# Patient Record
Sex: Male | Born: 1940 | Race: White | Hispanic: No | Smoking: Former smoker
Health system: Southern US, Community
[De-identification: ages and names within clinical notes are randomized; demographics above are authoritative.]

## PROBLEM LIST (undated history)

## (undated) DIAGNOSIS — I2510 Atherosclerotic heart disease of native coronary artery without angina pectoris: Secondary | ICD-10-CM

## (undated) DIAGNOSIS — I209 Angina pectoris, unspecified: Secondary | ICD-10-CM

## (undated) DIAGNOSIS — N189 Chronic kidney disease, unspecified: Secondary | ICD-10-CM

## (undated) DIAGNOSIS — R0600 Dyspnea, unspecified: Secondary | ICD-10-CM

## (undated) DIAGNOSIS — I10 Essential (primary) hypertension: Secondary | ICD-10-CM

## (undated) DIAGNOSIS — E7800 Pure hypercholesterolemia, unspecified: Secondary | ICD-10-CM

## (undated) DIAGNOSIS — M549 Dorsalgia, unspecified: Secondary | ICD-10-CM

## (undated) DIAGNOSIS — I219 Acute myocardial infarction, unspecified: Secondary | ICD-10-CM

## (undated) DIAGNOSIS — J449 Chronic obstructive pulmonary disease, unspecified: Secondary | ICD-10-CM

## (undated) DIAGNOSIS — M1990 Unspecified osteoarthritis, unspecified site: Secondary | ICD-10-CM

## (undated) DIAGNOSIS — F32A Depression, unspecified: Secondary | ICD-10-CM

## (undated) DIAGNOSIS — I509 Heart failure, unspecified: Secondary | ICD-10-CM

## (undated) HISTORY — PX: CARDIAC SURGERY: SHX584

## (undated) HISTORY — PX: COLONOSCOPY: SHX174

## (undated) HISTORY — PX: AORTIC VALVE REPLACEMENT: SHX41

## (undated) HISTORY — PX: BACK SURGERY: SHX140

---

## 2011-12-07 ENCOUNTER — Ambulatory Visit: Payer: Self-pay | Admitting: Internal Medicine

## 2011-12-24 ENCOUNTER — Ambulatory Visit: Payer: Self-pay | Admitting: Internal Medicine

## 2012-01-24 ENCOUNTER — Ambulatory Visit: Payer: Self-pay

## 2013-10-23 ENCOUNTER — Encounter: Payer: Self-pay | Admitting: Otolaryngology

## 2013-11-22 ENCOUNTER — Encounter: Payer: Self-pay | Admitting: Otolaryngology

## 2015-12-15 ENCOUNTER — Ambulatory Visit: Payer: Medicare Other

## 2015-12-15 ENCOUNTER — Ambulatory Visit
Admission: EM | Admit: 2015-12-15 | Discharge: 2015-12-15 | Disposition: A | Discharge status: Home / Self Care | Payer: Medicare Other | Attending: Family Medicine | Admitting: Family Medicine

## 2015-12-15 DIAGNOSIS — J449 Chronic obstructive pulmonary disease, unspecified: Secondary | ICD-10-CM | POA: Insufficient documentation

## 2015-12-15 DIAGNOSIS — J441 Chronic obstructive pulmonary disease with (acute) exacerbation: Secondary | ICD-10-CM

## 2015-12-15 HISTORY — DX: Chronic obstructive pulmonary disease, unspecified: J44.9

## 2015-12-15 HISTORY — DX: Essential (primary) hypertension: I10

## 2015-12-15 HISTORY — DX: Pure hypercholesterolemia, unspecified: E78.00

## 2015-12-15 HISTORY — DX: Dorsalgia, unspecified: M54.9

## 2015-12-15 MED ORDER — SULFAMETHOXAZOLE-TRIMETHOPRIM 800-160 MG PO TABS: 1.0000 | ORAL_TABLET | Freq: Two times a day (BID) | ORAL | Status: DC

## 2015-12-15 MED ORDER — PREDNISONE 20 MG PO TABS: ORAL_TABLET | ORAL | Status: DC

## 2015-12-15 MED ORDER — IPRATROPIUM-ALBUTEROL 0.5-2.5 (3) MG/3ML IN SOLN
3.0000 mL | Freq: Once | RESPIRATORY_TRACT | Status: AC
  Administered 2015-12-15: 3 mL via RESPIRATORY_TRACT

## 2015-12-15 NOTE — ED Notes (Signed)
Hx of COPD. For the last 2 weeks getting worse. SOB at rest and worse with exertion

## 2015-12-15 NOTE — ED Provider Notes (Signed)
CSN: [id number]     Arrival date & time 12/15/15  1518 History   First MD Initiated Contact with Patient 12/15/15 [phone]     Chief Complaint  Patient presents with  . COPD   (Consider location/radiation/quality/duration/timing/severity/associated sxs/prior Treatment) HPI Comments: 75 yo male with a h/o COPD presents with a complaint of worsening cough and shortness of breath for the past 2 weeks. Denies any fevers, chills, chest pains. Has been using his inhalers (albuterol, spiriva, advair). States does not use oxygen at home and also states he's never required hospitalization for copd exacerbation.   The history is provided by the patient.    Past Medical History  Diagnosis Date  . COPD (chronic obstructive pulmonary disease) ([place])   . Hypertension   . Hypercholesteremia   . Back ache    History reviewed. No pertinent past surgical history. Family History  Problem Relation Age of Onset  . Lung disease Mother   . Cancer Father    Social History  Substance Use Topics  . Smoking status: Former Research scientist (life sciences)  . Smokeless tobacco: None  . Alcohol Use: No    Review of Systems  Allergies  Review of patient's allergies indicates no known allergies.  Home Medications   Prior to Admission medications   Medication Sig Start Date End Date Taking? Authorizing Provider  atorvastatin (LIPITOR) 80 MG tablet Take 80 mg by mouth daily.   Yes Historical Provider, MD  Fluticasone-Salmeterol (ADVAIR) 250-50 MCG/DOSE AEPB Inhale 1 puff into the lungs 2 (two) times daily.   Yes Historical Provider, MD  gabapentin (NEURONTIN) 300 MG capsule Take 300 mg by mouth 3 (three) times daily.   Yes Historical Provider, MD  ipratropium-albuterol (DUONEB) 0.5-2.5 (3) MG/3ML SOLN Take 3 mLs by nebulization.   Yes Historical Provider, MD  lisinopril (PRINIVIL,ZESTRIL) 10 MG tablet Take 10 mg by mouth daily.   Yes Historical Provider, MD  sertraline (ZOLOFT) 100 MG tablet Take 100 mg by mouth daily.   Yes  Historical Provider, MD  tiotropium (SPIRIVA) 18 MCG inhalation capsule Place 18 mcg into inhaler and inhale daily.   Yes Historical Provider, MD  traZODone (DESYREL) 50 MG tablet Take 50 mg by mouth at bedtime.   Yes Historical Provider, MD  predniSONE (DELTASONE) 20 MG tablet 3 tabs po qd for 2 days, then 2 tabs po qd for 3 days, then 1 tab po qd for 3 days, then half a tab po qd for 2 days 12/15/15   Norval Gable, MD  sulfamethoxazole-trimethoprim (BACTRIM DS,SEPTRA DS) 800-160 MG tablet Take 1 tablet by mouth 2 (two) times daily. 12/15/15   Norval Gable, MD   Meds Ordered and Administered this Visit   Medications  ipratropium-albuterol (DUONEB) 0.5-2.5 (3) MG/3ML nebulizer solution 3 mL (3 mLs Nebulization Given 12/15/15 1620)    BP 114/68 mmHg  Pulse 80  Temp(Src) 97.2 F (36.2 C) (Tympanic)  Resp 24  Ht 5\' 7"  (1.702 m)  Wt 171 lb (77.565 kg)  BMI 26.78 kg/m2  SpO2 96% No data found.   Physical Exam  Constitutional: He appears well-developed and well-nourished. No distress.  HENT:  Head: Normocephalic and atraumatic.  Right Ear: Tympanic membrane, external ear and ear canal normal.  Left Ear: Tympanic membrane, external ear and ear canal normal.  Nose: Nose normal.  Mouth/Throat: Uvula is midline, oropharynx is clear and moist and mucous membranes are normal. No oropharyngeal exudate or tonsillar abscesses.  Eyes: Conjunctivae and EOM are normal. Pupils are equal, round, and reactive to  light. Right eye exhibits no discharge. Left eye exhibits no discharge. No scleral icterus.  Neck: Normal range of motion. Neck supple. No tracheal deviation present. No thyromegaly present.  Cardiovascular: Normal rate, regular rhythm and normal heart sounds.   Pulmonary/Chest: Effort normal. No stridor. No respiratory distress. He has wheezes (mild diffuse expiratory wheezes and rhonchi bilaterally). He has no rales. He exhibits no tenderness.  Lymphadenopathy:    He has no cervical  adenopathy.  Neurological: He is alert.  Skin: Skin is warm and dry. No rash noted. He is not diaphoretic.  Nursing note and vitals reviewed.   ED Course  Procedures (including critical care time)  Labs Review Labs Reviewed - No data to display  Imaging Review Dg Chest 2 View  12/15/2015  CLINICAL DATA:  Shortness of breath on exertion with 2 week history of productive cough. EXAM: CHEST  2 VIEW COMPARISON:  01/24/2012. FINDINGS: Hyperexpansion is consistent with emphysema. Cardiopericardial silhouette is at upper limits of normal for size. Stable appearance of sternal hardware. Status post right rotator cuff surgery. The visualized bony structures of the thorax are intact. IMPRESSION: Stable.  Emphysema without acute cardiopulmonary findings. Electronically Signed   By: Misty Stanley M.D.   On: 12/15/2015 16:15     Visual Acuity Review  Right Eye Distance:   Left Eye Distance:   Bilateral Distance:    Right Eye Near:   Left Eye Near:    Bilateral Near:         MDM   1. COPD with exacerbation [facility])    Discharge Medication List as of 12/15/2015  5:02 PM    START taking these medications   Details  predniSONE (DELTASONE) 20 MG tablet 3 tabs po qd for 2 days, then 2 tabs po qd for 3 days, then 1 tab po qd for 3 days, then half a tab po qd for 2 days, Normal    sulfamethoxazole-trimethoprim (BACTRIM DS,SEPTRA DS) 800-160 MG tablet Take 1 tablet by mouth 2 (two) times daily., Starting 12/15/2015, Until Discontinued, Normal       1. x-ray results (negative chest-ray) and diagnosis reviewed with patient 2. rx as per orders above; reviewed possible side effects, interactions, risks and benefits  3. Patient given Duoneb nebulizer treatment with improvement of symptoms 4. Follow-up prn if symptoms worsen or don't improve    Norval Gable, MD 12/15/15 1710

## 2017-12-19 ENCOUNTER — Other Ambulatory Visit: Payer: Self-pay | Admitting: Family Medicine

## 2017-12-19 DIAGNOSIS — M5416 Radiculopathy, lumbar region: Secondary | ICD-10-CM

## 2017-12-19 DIAGNOSIS — I10 Essential (primary) hypertension: Secondary | ICD-10-CM

## 2017-12-27 ENCOUNTER — Ambulatory Visit
Admission: RE | Admit: 2017-12-27 | Discharge: 2017-12-27 | Disposition: A | Discharge status: Home / Self Care | Payer: Medicare Other | Source: Ambulatory Visit | Attending: Family Medicine | Admitting: Family Medicine

## 2017-12-27 DIAGNOSIS — M48061 Spinal stenosis, lumbar region without neurogenic claudication: Secondary | ICD-10-CM | POA: Diagnosis not present

## 2017-12-27 DIAGNOSIS — M5116 Intervertebral disc disorders with radiculopathy, lumbar region: Secondary | ICD-10-CM | POA: Insufficient documentation

## 2017-12-27 DIAGNOSIS — M5416 Radiculopathy, lumbar region: Secondary | ICD-10-CM

## 2017-12-27 DIAGNOSIS — N261 Atrophy of kidney (terminal): Secondary | ICD-10-CM | POA: Diagnosis not present

## 2017-12-27 DIAGNOSIS — I10 Essential (primary) hypertension: Secondary | ICD-10-CM | POA: Diagnosis present

## 2018-10-06 ENCOUNTER — Ambulatory Visit
Admission: EM | Admit: 2018-10-06 | Discharge: 2018-10-06 | Disposition: A | Discharge status: Home / Self Care | Payer: Medicare Other | Attending: Family Medicine | Admitting: Family Medicine

## 2018-10-06 DIAGNOSIS — Z87891 Personal history of nicotine dependence: Secondary | ICD-10-CM | POA: Insufficient documentation

## 2018-10-06 DIAGNOSIS — E7800 Pure hypercholesterolemia, unspecified: Secondary | ICD-10-CM | POA: Insufficient documentation

## 2018-10-06 DIAGNOSIS — I10 Essential (primary) hypertension: Secondary | ICD-10-CM | POA: Diagnosis not present

## 2018-10-06 DIAGNOSIS — Z79899 Other long term (current) drug therapy: Secondary | ICD-10-CM | POA: Insufficient documentation

## 2018-10-06 DIAGNOSIS — J029 Acute pharyngitis, unspecified: Secondary | ICD-10-CM | POA: Diagnosis not present

## 2018-10-06 DIAGNOSIS — R590 Localized enlarged lymph nodes: Secondary | ICD-10-CM | POA: Insufficient documentation

## 2018-10-06 DIAGNOSIS — J449 Chronic obstructive pulmonary disease, unspecified: Secondary | ICD-10-CM | POA: Insufficient documentation

## 2018-10-06 LAB — RAPID STREP SCREEN (MED CTR MEBANE ONLY): Streptococcus, Group A Screen (Direct): NEGATIVE

## 2018-10-06 NOTE — ED Triage Notes (Signed)
Pt here for sore throat, swollen glands in his neck and painful when swallowing for 2 days. Also complains of body aches that come and go. No fever reported. Has been taking tylenol otc. No reportedly around small children.

## 2018-10-06 NOTE — ED Provider Notes (Signed)
[facility]    CSN: [id number] Arrival date & time: 10/06/18  1600     History   Chief Complaint Chief Complaint  Patient presents with  . Sore Throat    HPI Mario Patrick is a 77 y.o. male.   HPI  -year-old male presents with a sore throat swollen glands in his neck and pain with swallowing for the last 2 days.  She has been having body aches that come and go but no fever.  Around no sick contacts that he knows of.  Taking Tylenol.  He states his wife is undergoing cancer therapy does not want to infect her.         Past Medical History:  Diagnosis Date  . Back ache   . COPD (chronic obstructive pulmonary disease) ([place])   . Hypercholesteremia   . Hypertension     There are no active problems to display for this patient.   History reviewed. No pertinent surgical history.     Home Medications    Prior to Admission medications   Medication Sig Start Date End Date Taking? Authorizing Provider  atorvastatin (LIPITOR) 80 MG tablet Take 80 mg by mouth daily.   Yes [provider]  Fluticasone-Salmeterol (ADVAIR) 250-50 MCG/DOSE AEPB Inhale 1 puff into the lungs 2 (two) times daily.   Yes [provider]  lisinopril (PRINIVIL,ZESTRIL) 10 MG tablet Take 10 mg by mouth daily.   Yes [provider]  sertraline (ZOLOFT) 100 MG tablet Take 100 mg by mouth daily.   Yes [provider]  tiotropium (SPIRIVA) 18 MCG inhalation capsule Place 18 mcg into inhaler and inhale daily.   Yes [provider]  traZODone (DESYREL) 50 MG tablet Take 50 mg by mouth at bedtime.   Yes [provider]  gabapentin (NEURONTIN) 300 MG capsule Take 300 mg by mouth 3 (three) times daily.    [provider]  ipratropium-albuterol (DUONEB) 0.5-2.5 (3) MG/3ML SOLN Take 3 mLs by nebulization.    [provider]  predniSONE (DELTASONE) 20 MG tablet 3 tabs po qd for 2 days, then 2 tabs po qd for 3 days, then 1  tab po qd for 3 days, then half a tab po qd for 2 days 12/15/15   Norval Gable, MD  sulfamethoxazole-trimethoprim (BACTRIM DS,SEPTRA DS) 800-160 MG tablet Take 1 tablet by mouth 2 (two) times daily. 12/15/15   Norval Gable, MD    Family History Family History  Problem Relation Age of Onset  . Lung disease Mother   . Cancer Father     Social History Social History   Tobacco Use  . Smoking status: Former Research scientist (life sciences)  . Smokeless tobacco: Never Used  Substance Use Topics  . Alcohol use: No  . Drug use: Not on file     Allergies   Patient has no known allergies.   Review of Systems Review of Systems  Constitutional: Positive for activity change. Negative for chills, fatigue and fever.  HENT: Positive for sore throat and trouble swallowing.   All other systems reviewed and are negative.    Physical Exam Triage Vital Signs ED Triage Vitals  Enc Vitals Group     BP 10/06/18 1607 (!) 160/92     Pulse Rate 10/06/18 1607 65     Resp 10/06/18 1607 18     Temp 10/06/18 1607 98.5 F (36.9 C)     Temp Source 10/06/18 1607 Oral     SpO2 10/06/18 1607 98 %  Weight 10/06/18 1610 155 lb (70.3 kg)     Height --      Head Circumference --      Peak Flow --      Pain Score 10/06/18 1610 0     Pain Loc --      Pain Edu? --      Excl. in [place]? --    No data found.  Updated Vital Signs BP (!) 160/92 (BP Location: Right Arm)   Pulse 65   Temp 98.5 F (36.9 C) (Oral)   Resp 18   Wt 155 lb (70.3 kg)   SpO2 98%   BMI 24.28 kg/m   Visual Acuity Right Eye Distance:   Left Eye Distance:   Bilateral Distance:    Right Eye Near:   Left Eye Near:    Bilateral Near:     Physical Exam  Constitutional: He is oriented to person, place, and time. He appears well-developed and well-nourished.  Non-toxic appearance. He does not appear ill. No distress.  HENT:  Head: Normocephalic.  Right Ear: Hearing, tympanic membrane and ear canal normal.  Left Ear: Hearing, tympanic membrane  and ear canal normal.  Mouth/Throat: Uvula is midline and mucous membranes are normal. No oral lesions. No uvula swelling. Posterior oropharyngeal erythema present. No oropharyngeal exudate, posterior oropharyngeal edema or tonsillar abscesses. Tonsils are 0 on the right. Tonsils are 0 on the left. No tonsillar exudate.  Eyes: Pupils are equal, round, and reactive to light.  Neck: Normal range of motion. Neck supple.  Sub mandibular nodes present  Pulmonary/Chest: Effort normal and breath sounds normal.  Lymphadenopathy:    He has cervical adenopathy.  Neurological: He is alert and oriented to person, place, and time.  Skin: Skin is warm and dry.  Psychiatric: He has a normal mood and affect. His behavior is normal.  Nursing note and vitals reviewed.    UC Treatments / Results  Labs (all labs ordered are listed, but only abnormal results are displayed) Labs Reviewed  RAPID STREP SCREEN ([facility] ONLY)  CULTURE, GROUP A STREP [facility])    EKG None  Radiology No results found.  Procedures Procedures (including critical care time)  Medications Ordered in UC Medications - No data to display  Initial Impression / Assessment and Plan / UC Course  I have reviewed the triage vital signs and the nursing notes.  Pertinent labs & imaging results that were available during my care of the patient were reviewed by me and considered in my medical decision making (see chart for details).   I explained to the patient the rapid strep group A testing.  His is negative today.  However additional testing will be done by incubating a culture to prove or disprove the negativity as well as identifying subgroups.  In the meantime he will need to treat this symptomatically with salt water gargles throat lozenges and analgesics as necessary.  Cultures and sensitivities will be available in 48 hours.  He worsens he may return to our clinic or go to his primary care physician   Final Clinical  Impressions(s) / UC Diagnoses   Final diagnoses:  Sore throat   Discharge Instructions   None    ED Prescriptions    None     Controlled Substance Prescriptions [place] Controlled Substance Registry consulted? Not Applicable   Bosco, [place], PA-C 10/06/18 1721

## 2018-10-09 LAB — CULTURE, GROUP A STREP (THRC)

## 2018-11-18 ENCOUNTER — Other Ambulatory Visit: Payer: Self-pay

## 2018-11-18 ENCOUNTER — Ambulatory Visit
Admission: EM | Admit: 2018-11-18 | Discharge: 2018-11-18 | Disposition: A | Discharge status: Home / Self Care | Payer: Medicare Other | Attending: Family Medicine | Admitting: Family Medicine

## 2018-11-18 ENCOUNTER — Encounter: Payer: Self-pay | Admitting: Emergency Medicine

## 2018-11-18 DIAGNOSIS — E7800 Pure hypercholesterolemia, unspecified: Secondary | ICD-10-CM | POA: Insufficient documentation

## 2018-11-18 DIAGNOSIS — Z952 Presence of prosthetic heart valve: Secondary | ICD-10-CM | POA: Diagnosis not present

## 2018-11-18 DIAGNOSIS — Z87891 Personal history of nicotine dependence: Secondary | ICD-10-CM

## 2018-11-18 DIAGNOSIS — Z951 Presence of aortocoronary bypass graft: Secondary | ICD-10-CM | POA: Diagnosis not present

## 2018-11-18 DIAGNOSIS — I10 Essential (primary) hypertension: Secondary | ICD-10-CM | POA: Insufficient documentation

## 2018-11-18 DIAGNOSIS — J111 Influenza due to unidentified influenza virus with other respiratory manifestations: Secondary | ICD-10-CM | POA: Insufficient documentation

## 2018-11-18 DIAGNOSIS — Z79899 Other long term (current) drug therapy: Secondary | ICD-10-CM | POA: Diagnosis not present

## 2018-11-18 DIAGNOSIS — J449 Chronic obstructive pulmonary disease, unspecified: Secondary | ICD-10-CM | POA: Insufficient documentation

## 2018-11-18 DIAGNOSIS — R05 Cough: Secondary | ICD-10-CM | POA: Diagnosis present

## 2018-11-18 LAB — RAPID INFLUENZA A&B ANTIGENS (ARMC ONLY)
INFLUENZA A (ARMC): POSITIVE — AB
INFLUENZA B (ARMC): NEGATIVE

## 2018-11-18 MED ORDER — OSELTAMIVIR PHOSPHATE 75 MG PO CAPS: 75.0000 mg | ORAL_CAPSULE | Freq: Two times a day (BID) | ORAL | 0 refills | Status: DC

## 2018-11-18 MED ORDER — PREDNISONE 50 MG PO TABS: ORAL_TABLET | ORAL | 0 refills | Status: DC

## 2018-11-18 NOTE — ED Triage Notes (Signed)
Pt c/o body aches, cough, chills, and nasal congestion. Started 2 days ago.

## 2018-11-18 NOTE — ED Provider Notes (Signed)
[facility]    CSN: [id number] Arrival date & time: 11/18/18  1241  History   Chief Complaint Chief Complaint  Patient presents with  . Generalized Body Aches  . Cough   HPI   77 year old male with COPD presents with concerns for influenza.  Patient reports that his symptoms started 2 days ago.  Reports subjective fever, chills, body aches, congestion, cough.  Feels very poorly.  Concerned about flu.  No reported sick contacts.  Endorses compliance with his COPD medications.  No known exacerbating factors.  No other associated symptoms.  No complaints.  PMH, Surgical Hx, Family Hx, Social History reviewed and updated as below.  Past Medical History:  Diagnosis Date  . Back ache   . COPD (chronic obstructive pulmonary disease) ([place])   . Hypercholesteremia   . Hypertension    Past Surgical History:  Procedure Laterality Date  . AORTIC VALVE REPLACEMENT    . BACK SURGERY    . CARDIAC SURGERY     CABG X4   Home Medications    Prior to Admission medications   Medication Sig Start Date End Date Taking? Authorizing Provider  atorvastatin (LIPITOR) 80 MG tablet Take 80 mg by mouth daily.   Yes [provider]  Fluticasone-Salmeterol (ADVAIR) 250-50 MCG/DOSE AEPB Inhale 1 puff into the lungs 2 (two) times daily.   Yes [provider]  ipratropium-albuterol (DUONEB) 0.5-2.5 (3) MG/3ML SOLN Take 3 mLs by nebulization.   Yes [provider]  lisinopril (PRINIVIL,ZESTRIL) 10 MG tablet Take 10 mg by mouth daily.   Yes [provider]  sertraline (ZOLOFT) 100 MG tablet Take 100 mg by mouth daily.   Yes [provider]  tiotropium (SPIRIVA) 18 MCG inhalation capsule Place 18 mcg into inhaler and inhale daily.   Yes [provider]  traZODone (DESYREL) 50 MG tablet Take 50 mg by mouth at bedtime.   Yes [provider]  oseltamivir (TAMIFLU) 75 MG capsule Take 1 capsule (75 mg total) by mouth every 12 (twelve)  hours. 11/18/18   Coral Spikes, DO  predniSONE (DELTASONE) 50 MG tablet 1 tablet daily x 5 days 11/18/18   Coral Spikes, DO    Family History Family History  Problem Relation Age of Onset  . Lung disease Mother   . Cancer Father     Social History Social History   Tobacco Use  . Smoking status: Former Research scientist (life sciences)  . Smokeless tobacco: Never Used  Substance Use Topics  . Alcohol use: No  . Drug use: Not on file     Allergies   Patient has no known allergies.   Review of Systems Review of Systems  Constitutional: Positive for chills and fever.  HENT: Positive for congestion.   Respiratory: Positive for cough.   Musculoskeletal:       Bodyaches.   Physical Exam Triage Vital Signs ED Triage Vitals  Enc Vitals Group     BP 11/18/18 1318 137/90     Pulse Rate 11/18/18 1318 91     Resp 11/18/18 1318 20     Temp 11/18/18 1318 99.8 F (37.7 C)     Temp Source 11/18/18 1318 Oral     SpO2 11/18/18 1318 96 %     Weight 11/18/18 1314 155 lb (70.3 kg)     Height 11/18/18 1314 5\' 7"  (1.702 m)     Head Circumference --      Peak Flow --      Pain Score 11/18/18  1314 7     Pain Loc --      Pain Edu? --      Excl. in [place]? --    Updated Vital Signs BP 137/90 (BP Location: Left Arm)   Pulse 91   Temp 99.8 F (37.7 C) (Oral)   Resp 20   Ht 5\' 7"  (1.702 m)   Wt 70.3 kg   SpO2 96%   BMI 24.28 kg/m   Visual Acuity Right Eye Distance:   Left Eye Distance:   Bilateral Distance:    Right Eye Near:   Left Eye Near:    Bilateral Near:     Physical Exam Vitals signs and nursing note reviewed.  Constitutional:      Comments: Mild increased work of breathing.  HENT:     Head: Normocephalic and atraumatic.     Right Ear: Tympanic membrane normal.     Left Ear: Tympanic membrane normal.     Mouth/Throat:     Pharynx: Oropharynx is clear. No posterior oropharyngeal erythema.  Cardiovascular:     Rate and Rhythm: Normal rate and regular rhythm.  Pulmonary:     Breath  sounds: No wheezing or rales.     Comments: Mild increased work of breathing.  Neurological:     Mental Status: He is alert.  Psychiatric:        Mood and Affect: Mood normal.        Behavior: Behavior normal.    UC Treatments / Results  Labs (all labs ordered are listed, but only abnormal results are displayed) Labs Reviewed  RAPID INFLUENZA A&B ANTIGENS ([facility] ONLY) - Abnormal; Notable for the following components:      Result Value   Influenza A ([facility]) POSITIVE (*)    All other components within normal limits    EKG None  Radiology No results found.  Procedures Procedures (including critical care time)  Medications Ordered in UC Medications - No data to display  Initial Impression / Assessment and Plan / UC Course  I have reviewed the triage vital signs and the nursing notes.  Pertinent labs & imaging results that were available during my care of the patient were reviewed by me and considered in my medical decision making (see chart for details).    77 year old male presents with influenza.  Treating with Tamiflu.  Given COPD and increased work of breathing, placing on prednisone.  Final Clinical Impressions(s) / UC Diagnoses   Final diagnoses:  Influenza   Discharge Instructions   None    ED Prescriptions    Medication Sig Dispense Auth. Provider   oseltamivir (TAMIFLU) 75 MG capsule Take 1 capsule (75 mg total) by mouth every 12 (twelve) hours. 10 capsule Madigan Rosensteel G, DO   predniSONE (DELTASONE) 50 MG tablet 1 tablet daily x 5 days 5 tablet Coral Spikes, DO     Controlled Substance Prescriptions [place] Controlled Substance Registry consulted? Not Applicable   Coral Spikes, DO 11/18/18 1429

## 2019-11-01 ENCOUNTER — Other Ambulatory Visit: Payer: Self-pay

## 2019-11-01 ENCOUNTER — Other Ambulatory Visit: Payer: Self-pay | Admitting: Family Medicine

## 2019-11-01 ENCOUNTER — Ambulatory Visit
Admission: RE | Admit: 2019-11-01 | Discharge: 2019-11-01 | Disposition: A | Discharge status: Home / Self Care | Payer: Medicare Other | Source: Ambulatory Visit | Attending: Family Medicine | Admitting: Family Medicine

## 2019-11-01 ENCOUNTER — Ambulatory Visit
Admission: RE | Admit: 2019-11-01 | Discharge: 2019-11-01 | Disposition: A | Discharge status: Home / Self Care | Payer: Medicare Other | Attending: Family Medicine | Admitting: Family Medicine

## 2019-11-01 DIAGNOSIS — R52 Pain, unspecified: Secondary | ICD-10-CM | POA: Insufficient documentation

## 2019-11-11 ENCOUNTER — Other Ambulatory Visit: Payer: Self-pay

## 2019-11-11 ENCOUNTER — Ambulatory Visit
Admission: EM | Admit: 2019-11-11 | Discharge: 2019-11-11 | Disposition: A | Discharge status: Home / Self Care | Payer: Medicare Other | Attending: Family Medicine | Admitting: Family Medicine

## 2019-11-11 ENCOUNTER — Encounter: Payer: Self-pay | Admitting: Emergency Medicine

## 2019-11-11 DIAGNOSIS — H8110 Benign paroxysmal vertigo, unspecified ear: Secondary | ICD-10-CM | POA: Diagnosis present

## 2019-11-11 DIAGNOSIS — I10 Essential (primary) hypertension: Secondary | ICD-10-CM | POA: Diagnosis present

## 2019-11-11 DIAGNOSIS — Z87891 Personal history of nicotine dependence: Secondary | ICD-10-CM

## 2019-11-11 NOTE — ED Triage Notes (Signed)
Patient states that his blood pressure readings at home been elevated 190/100 for the past 2 days.  Patient reports dizziness that started this morning.

## 2019-11-11 NOTE — ED Provider Notes (Signed)
[facility]    CSN: [id number] Arrival date & time: 11/11/19  1223      History   Chief Complaint Chief Complaint  Patient presents with  . Hypertension  . Dizziness    HPI Mario Patrick is a 78 y.o. male.   78 yo male with a c/o intermittent dizziness for the past 2 days. States he notices it with body or head position changes and feels "like things are spinning", then resolves. This has made him nervous and his blood pressures have been running higher. Denies any numbness/tingling, unilateral weakness, vision changes, speech or swallowing problems, chest pains, palpitations, shortness of breath, syncope.    Hypertension  Dizziness   Past Medical History:  Diagnosis Date  . Back ache   . COPD (chronic obstructive pulmonary disease) ([place])   . Hypercholesteremia   . Hypertension     There are no problems to display for this patient.   Past Surgical History:  Procedure Laterality Date  . AORTIC VALVE REPLACEMENT    . BACK SURGERY    . CARDIAC SURGERY     CABG X4       Home Medications    Prior to Admission medications   Medication Sig Start Date End Date Taking? Authorizing Provider  atorvastatin (LIPITOR) 80 MG tablet Take 80 mg by mouth daily.   Yes [provider]  Fluticasone-Salmeterol (ADVAIR) 250-50 MCG/DOSE AEPB Inhale 1 puff into the lungs 2 (two) times daily.   Yes [provider]  ipratropium-albuterol (DUONEB) 0.5-2.5 (3) MG/3ML SOLN Take 3 mLs by nebulization.   Yes [provider]  lisinopril (PRINIVIL,ZESTRIL) 10 MG tablet Take 10 mg by mouth daily.   Yes [provider]  sertraline (ZOLOFT) 100 MG tablet Take 100 mg by mouth daily.   Yes [provider]  tiotropium (SPIRIVA) 18 MCG inhalation capsule Place 18 mcg into inhaler and inhale daily.   Yes [provider]  traZODone (DESYREL) 50 MG tablet Take 50 mg by mouth at bedtime.   Yes [provider]    oseltamivir (TAMIFLU) 75 MG capsule Take 1 capsule (75 mg total) by mouth every 12 (twelve) hours. 11/18/18   Coral Spikes, DO  predniSONE (DELTASONE) 50 MG tablet 1 tablet daily x 5 days 11/18/18   Coral Spikes, DO    Family History Family History  Problem Relation Age of Onset  . Lung disease Mother   . Cancer Father     Social History Social History   Tobacco Use  . Smoking status: Former Research scientist (life sciences)  . Smokeless tobacco: Never Used  Substance Use Topics  . Alcohol use: No  . Drug use: Never     Allergies   Contrast media  [iodinated diagnostic agents] and Aspirin   Review of Systems Review of Systems  Neurological: Positive for dizziness.     Physical Exam Triage Vital Signs ED Triage Vitals  Enc Vitals Group     BP 11/11/19 1238 (S) (!) 228/91     Pulse Rate 11/11/19 1238 65     Resp 11/11/19 1238 16     Temp 11/11/19 1238 98.1 F (36.7 C)     Temp Source 11/11/19 1238 Oral     SpO2 11/11/19 1238 100 %     Weight 11/11/19 1235 147 lb (66.7 kg)     Height 11/11/19 1235 5\' 7"  (1.702 m)     Head Circumference --      Peak Flow --  Pain Score 11/11/19 1234 2     Pain Loc --      Pain Edu? --      Excl. in [place]? --    No data found.  Updated Vital Signs BP (!) 144/100 (BP Location: Right Arm)   Pulse 65   Temp 98.1 F (36.7 C) (Oral)   Resp 16   Ht 5\' 7"  (1.702 m)   Wt 66.7 kg   SpO2 100%   BMI 23.02 kg/m   Visual Acuity Right Eye Distance:   Left Eye Distance:   Bilateral Distance:    Right Eye Near:   Left Eye Near:    Bilateral Near:     Physical Exam Vitals and nursing note reviewed.  Constitutional:      General: He is not in acute distress.    Appearance: He is not toxic-appearing or diaphoretic.  HENT:     Right Ear: Tympanic membrane normal.     Left Ear: Tympanic membrane normal.  Eyes:     Extraocular Movements: Extraocular movements intact.     Pupils: Pupils are equal, round, and reactive to light.  Cardiovascular:      Rate and Rhythm: Normal rate and regular rhythm.     Heart sounds: Normal heart sounds.  Pulmonary:     Effort: Pulmonary effort is normal. No respiratory distress.     Breath sounds: Normal breath sounds.  Neurological:     General: No focal deficit present.     Mental Status: He is alert and oriented to person, place, and time.     Cranial Nerves: No cranial nerve deficit.     Sensory: No sensory deficit.     Motor: No weakness.     Coordination: Coordination normal.     Gait: Gait normal.     Deep Tendon Reflexes: Reflexes normal.     Comments: Positive Hallpike manuever      UC Treatments / Results  Labs (all labs ordered are listed, but only abnormal results are displayed) Labs Reviewed - No data to display  EKG   Radiology No results found.  Procedures ED EKG  Date/Time: 11/11/2019 4:25 PM Performed by: Norval Gable, MD Authorized by: Norval Gable, MD   ECG reviewed by ED Physician in the absence of a cardiologist: yes   Previous ECG:    Previous ECG:  Unavailable Interpretation:    Interpretation: abnormal   Rate:    ECG rate:  61   ECG rate assessment: normal   Rhythm:    Rhythm: sinus rhythm   Ectopy:    Ectopy: none   QRS:    QRS axis:  Normal Conduction:    Conduction: normal   ST segments:    ST segments:  Normal T waves:    T waves: inverted     Inverted:  V4, V5, V6 and aVL Other findings:    Other findings: LVH     (including critical care time)  Medications Ordered in UC Medications - No data to display  Initial Impression / Assessment and Plan / UC Course  I have reviewed the triage vital signs and the nursing notes.  Pertinent labs & imaging results that were available during my care of the patient were reviewed by me and considered in my medical decision making (see chart for details).      Final Clinical Impressions(s) / UC Diagnoses   Final diagnoses:  Benign paroxysmal positional vertigo, unspecified laterality   Essential hypertension     Discharge  Instructions     Continue current blood pressure medication and monitor Follow up with Primary Care Provider in the next 2-3 days    ED Prescriptions    None      1. ekg results and diagnosis reviewed with patient 2. Recommend supportive treatment as above 3. Follow-up prn  PDMP not reviewed this encounter.   Norval Gable, MD 11/11/19 [phone]

## 2019-11-11 NOTE — Discharge Instructions (Signed)
Continue current blood pressure medication and monitor Follow up with Primary Care Provider in the next 2-3 days

## 2020-04-25 ENCOUNTER — Encounter: Payer: Self-pay | Admitting: Emergency Medicine

## 2020-04-25 ENCOUNTER — Other Ambulatory Visit: Payer: Self-pay

## 2020-04-25 ENCOUNTER — Ambulatory Visit
Admission: EM | Admit: 2020-04-25 | Discharge: 2020-04-25 | Disposition: A | Discharge status: Home / Self Care | Payer: Medicare Other | Attending: Family Medicine | Admitting: Family Medicine

## 2020-04-25 ENCOUNTER — Ambulatory Visit (INDEPENDENT_AMBULATORY_CARE_PROVIDER_SITE_OTHER): Payer: Medicare Other

## 2020-04-25 DIAGNOSIS — W19XXXA Unspecified fall, initial encounter: Secondary | ICD-10-CM | POA: Diagnosis not present

## 2020-04-25 DIAGNOSIS — S20212A Contusion of left front wall of thorax, initial encounter: Secondary | ICD-10-CM

## 2020-04-25 MED ORDER — HYDROCODONE-ACETAMINOPHEN 5-325 MG PO TABS: ORAL_TABLET | ORAL | 0 refills | Status: DC

## 2020-04-25 NOTE — ED Provider Notes (Signed)
[facility]    CSN: [id number] Arrival date & time: 04/25/20  1327      History   Chief Complaint Chief Complaint  Patient presents with  . Fall  . Rib Pain    left    HPI Mario Patrick is a 79 y.o. male.   79 yo male with a c/o left rib pain after falling at home 3 days ago. States he fell at home and hit his left chest against the night stand. Denies hitting his head or loss of consciousness. Denies shortness of breath, fevers, chills.    Fall    Past Medical History:  Diagnosis Date  . Back ache   . COPD (chronic obstructive pulmonary disease) ([place])   . Hypercholesteremia   . Hypertension     There are no problems to display for this patient.   Past Surgical History:  Procedure Laterality Date  . AORTIC VALVE REPLACEMENT    . BACK SURGERY    . CARDIAC SURGERY     CABG X4       Home Medications    Prior to Admission medications   Medication Sig Start Date End Date Taking? Authorizing Provider  atorvastatin (LIPITOR) 80 MG tablet Take 80 mg by mouth daily.   Yes [provider]  Fluticasone-Salmeterol (ADVAIR) 250-50 MCG/DOSE AEPB Inhale 1 puff into the lungs 2 (two) times daily.   Yes [provider]  lisinopril (PRINIVIL,ZESTRIL) 10 MG tablet Take 10 mg by mouth daily.   Yes [provider]  sertraline (ZOLOFT) 100 MG tablet Take 100 mg by mouth daily.   Yes [provider]  tiotropium (SPIRIVA) 18 MCG inhalation capsule Place 18 mcg into inhaler and inhale daily.   Yes [provider]  traZODone (DESYREL) 50 MG tablet Take 50 mg by mouth at bedtime.   Yes [provider]  HYDROcodone-acetaminophen (NORCO/VICODIN) 5-325 MG tablet 1-2 tabs po bid prn 04/25/20   Norval Gable, MD  ipratropium-albuterol (DUONEB) 0.5-2.5 (3) MG/3ML SOLN Take 3 mLs by nebulization.    [provider]  oseltamivir (TAMIFLU) 75 MG capsule Take 1 capsule (75 mg total) by mouth every 12 (twelve)  hours. 11/18/18   Coral Spikes, DO  predniSONE (DELTASONE) 50 MG tablet 1 tablet daily x 5 days 11/18/18   Coral Spikes, DO    Family History Family History  Problem Relation Age of Onset  . Lung disease Mother   . Cancer Father     Social History Social History   Tobacco Use  . Smoking status: Former Research scientist (life sciences)  . Smokeless tobacco: Never Used  Substance Use Topics  . Alcohol use: No  . Drug use: Never     Allergies   Contrast media  [iodinated diagnostic agents] and Aspirin   Review of Systems Review of Systems   Physical Exam Triage Vital Signs ED Triage Vitals  Enc Vitals Group     BP 04/25/20 1337 (!) 152/77     Pulse Rate 04/25/20 1337 60     Resp 04/25/20 1337 16     Temp 04/25/20 1337 98 F (36.7 C)     Temp Source 04/25/20 1337 Oral     SpO2 04/25/20 1337 96 %     Weight 04/25/20 1335 135 lb (61.2 kg)     Height 04/25/20 1335 5\' 6"  (1.676 m)     Head Circumference --      Peak Flow --      Pain Score 04/25/20 1334  8     Pain Loc --      Pain Edu? --      Excl. in [place]? --    No data found.  Updated Vital Signs BP (!) 152/77 (BP Location: Right Arm)   Pulse 60   Temp 98 F (36.7 C) (Oral)   Resp 16   Ht 5\' 6"  (1.676 m)   Wt 61.2 kg   SpO2 96%   BMI 21.79 kg/m   Visual Acuity Right Eye Distance:   Left Eye Distance:   Bilateral Distance:    Right Eye Near:   Left Eye Near:    Bilateral Near:     Physical Exam Vitals and nursing note reviewed.  Constitutional:      General: He is not in acute distress.    Appearance: He is not toxic-appearing or diaphoretic.  Cardiovascular:     Rate and Rhythm: Normal rate.     Heart sounds: Normal heart sounds.  Pulmonary:     Effort: Pulmonary effort is normal. No respiratory distress.     Breath sounds: Normal breath sounds. No stridor. No wheezing, rhonchi or rales.  Chest:     Chest wall: Tenderness (left ribs; and bruising noted on left chest wall skin) present.  Neurological:     Mental  Status: He is alert.      UC Treatments / Results  Labs (all labs ordered are listed, but only abnormal results are displayed) Labs Reviewed - No data to display  EKG   Radiology DG Ribs Unilateral W/Chest Left  Result Date: 04/25/2020 CLINICAL DATA:  [place] 2 days ago with left anterior rib pain EXAM: LEFT RIBS AND CHEST - 3+ VIEW COMPARISON:  12/15/2015 FINDINGS: Previous median sternotomy. Heart size is normal. Chronic aortic atherosclerosis. The lungs are clear. No pneumothorax or hemothorax. Chronic pleural scarring on the left. Skin marker in place in the region of pain. No evidence of left-sided rib fracture. IMPRESSION: No active cardiopulmonary disease. Previous median sternotomy. Aortic atherosclerosis. No visible left-sided rib fracture. Skin marker in place in the area of concern in the lower left anterior chest. Electronically Signed   By: Nelson Chimes M.D.   On: 04/25/2020 14:52    Procedures Procedures (including critical care time)  Medications Ordered in UC Medications - No data to display  Initial Impression / Assessment and Plan / UC Course  I have reviewed the triage vital signs and the nursing notes.  Pertinent labs & imaging results that were available during my care of the patient were reviewed by me and considered in my medical decision making (see chart for details).     Final Clinical Impressions(s) / UC Diagnoses   Final diagnoses:  Contusion of rib on left side, initial encounter  Fall, initial encounter     Discharge Instructions     Rest, ice, over the counter tylenol/advil as needed    ED Prescriptions    Medication Sig Dispense Auth. Provider   HYDROcodone-acetaminophen (NORCO/VICODIN) 5-325 MG tablet 1-2 tabs po bid prn 8 tablet Missy Baksh, Linward Foster, MD      1. x-ray results and diagnosis reviewed with patient 2. rx as per orders above; reviewed possible side effects, interactions, risks and benefits  3. Recommend supportive treatment as  above 4. Follow-up prn if symptoms worsen or don't improve   I have reviewed the PDMP during this encounter.   Norval Gable, MD 04/25/20 [phone]

## 2020-04-25 NOTE — ED Triage Notes (Signed)
Patient states that he fell on Wed and hit his left side on an end table.  Patient c/o left sided rib pain.  Patient denies hitting his head.

## 2020-04-25 NOTE — Discharge Instructions (Signed)
Rest, ice, over the counter tylenol/advil as needed

## 2020-09-29 ENCOUNTER — Inpatient Hospital Stay
Admission: EM | Admit: 2020-09-29 | Discharge: 2020-10-01 | DRG: 312 | Disposition: A | Discharge status: Home Health | Payer: Medicare HMO | Attending: Internal Medicine | Admitting: Internal Medicine

## 2020-09-29 ENCOUNTER — Other Ambulatory Visit: Payer: Self-pay

## 2020-09-29 DIAGNOSIS — Z7951 Long term (current) use of inhaled steroids: Secondary | ICD-10-CM

## 2020-09-29 DIAGNOSIS — Z79899 Other long term (current) drug therapy: Secondary | ICD-10-CM

## 2020-09-29 DIAGNOSIS — N179 Acute kidney failure, unspecified: Secondary | ICD-10-CM | POA: Diagnosis present

## 2020-09-29 DIAGNOSIS — I959 Hypotension, unspecified: Secondary | ICD-10-CM

## 2020-09-29 DIAGNOSIS — I951 Orthostatic hypotension: Secondary | ICD-10-CM | POA: Diagnosis present

## 2020-09-29 DIAGNOSIS — Z20822 Contact with and (suspected) exposure to covid-19: Secondary | ICD-10-CM | POA: Diagnosis present

## 2020-09-29 DIAGNOSIS — J449 Chronic obstructive pulmonary disease, unspecified: Secondary | ICD-10-CM | POA: Diagnosis present

## 2020-09-29 DIAGNOSIS — Z952 Presence of prosthetic heart valve: Secondary | ICD-10-CM

## 2020-09-29 DIAGNOSIS — Z91041 Radiographic dye allergy status: Secondary | ICD-10-CM

## 2020-09-29 DIAGNOSIS — I739 Peripheral vascular disease, unspecified: Secondary | ICD-10-CM | POA: Diagnosis present

## 2020-09-29 DIAGNOSIS — E785 Hyperlipidemia, unspecified: Secondary | ICD-10-CM | POA: Diagnosis present

## 2020-09-29 DIAGNOSIS — Z7952 Long term (current) use of systemic steroids: Secondary | ICD-10-CM

## 2020-09-29 DIAGNOSIS — Z951 Presence of aortocoronary bypass graft: Secondary | ICD-10-CM

## 2020-09-29 DIAGNOSIS — R42 Dizziness and giddiness: Secondary | ICD-10-CM | POA: Diagnosis not present

## 2020-09-29 DIAGNOSIS — Z886 Allergy status to analgesic agent status: Secondary | ICD-10-CM

## 2020-09-29 DIAGNOSIS — E7800 Pure hypercholesterolemia, unspecified: Secondary | ICD-10-CM | POA: Diagnosis present

## 2020-09-29 DIAGNOSIS — D696 Thrombocytopenia, unspecified: Secondary | ICD-10-CM | POA: Diagnosis present

## 2020-09-29 DIAGNOSIS — F32A Depression, unspecified: Secondary | ICD-10-CM | POA: Diagnosis present

## 2020-09-29 DIAGNOSIS — I129 Hypertensive chronic kidney disease with stage 1 through stage 4 chronic kidney disease, or unspecified chronic kidney disease: Secondary | ICD-10-CM | POA: Diagnosis present

## 2020-09-29 DIAGNOSIS — N1832 Chronic kidney disease, stage 3b: Secondary | ICD-10-CM | POA: Diagnosis present

## 2020-09-29 DIAGNOSIS — E875 Hyperkalemia: Secondary | ICD-10-CM | POA: Diagnosis present

## 2020-09-29 DIAGNOSIS — E869 Volume depletion, unspecified: Secondary | ICD-10-CM | POA: Diagnosis present

## 2020-09-29 DIAGNOSIS — Z87891 Personal history of nicotine dependence: Secondary | ICD-10-CM

## 2020-09-29 DIAGNOSIS — I2510 Atherosclerotic heart disease of native coronary artery without angina pectoris: Secondary | ICD-10-CM | POA: Diagnosis present

## 2020-09-29 LAB — CBC
HCT: 42.8 % (ref 39.0–52.0)
Hemoglobin: 14.2 g/dL (ref 13.0–17.0)
MCH: 30.4 pg (ref 26.0–34.0)
MCHC: 33.2 g/dL (ref 30.0–36.0)
MCV: 91.6 fL (ref 80.0–100.0)
Platelets: 70 10*3/uL — ABNORMAL LOW (ref 150–400)
RBC: 4.67 MIL/uL (ref 4.22–5.81)
RDW: 13.6 % (ref 11.5–15.5)
WBC: 7.4 10*3/uL (ref 4.0–10.5)
nRBC: 0 % (ref 0.0–0.2)

## 2020-09-29 LAB — HEPATIC FUNCTION PANEL
ALT: 19 U/L (ref 0–44)
AST: 20 U/L (ref 15–41)
Albumin: 4 g/dL (ref 3.5–5.0)
Alkaline Phosphatase: 67 U/L (ref 38–126)
Bilirubin, Direct: 0.1 mg/dL (ref 0.0–0.2)
Indirect Bilirubin: 0.7 mg/dL (ref 0.3–0.9)
Total Bilirubin: 0.8 mg/dL (ref 0.3–1.2)
Total Protein: 6.8 g/dL (ref 6.5–8.1)

## 2020-09-29 LAB — BASIC METABOLIC PANEL
Anion gap: 10 (ref 5–15)
BUN: 55 mg/dL — ABNORMAL HIGH (ref 8–23)
CO2: 26 mmol/L (ref 22–32)
Calcium: 9.1 mg/dL (ref 8.9–10.3)
Chloride: 100 mmol/L (ref 98–111)
Creatinine, Ser: 2.08 mg/dL — ABNORMAL HIGH (ref 0.61–1.24)
GFR, Estimated: 32 mL/min — ABNORMAL LOW (ref >60)
Glucose, Bld: 102 mg/dL — ABNORMAL HIGH (ref 70–99)
Potassium: 5.5 mmol/L — ABNORMAL HIGH (ref 3.5–5.1)
Sodium: 136 mmol/L (ref 135–145)

## 2020-09-29 LAB — LACTIC ACID, PLASMA: Lactic Acid, Venous: 1.2 mmol/L (ref 0.5–1.9)

## 2020-09-29 LAB — TROPONIN I (HIGH SENSITIVITY): Troponin I (High Sensitivity): 30 ng/L — ABNORMAL HIGH (ref <18)

## 2020-09-29 MED ORDER — SODIUM CHLORIDE 0.9 % IV BOLUS
1000.0000 mL | Freq: Once | INTRAVENOUS | Status: AC
  Administered 2020-09-29: 1000 mL via INTRAVENOUS

## 2020-09-29 NOTE — ED Provider Notes (Signed)
[facility] Emergency Department Provider Note   ____________________________________________   First MD Initiated Contact with Patient 09/29/20 2059     (approximate)  I have reviewed the triage vital signs and the nursing notes.   HISTORY  Chief Complaint Dizziness    HPI Mario Patrick is a 79 y.o. male who reports he ran out of all of his medicines several days ago.  He reports he has been feeling dizzy and lightheaded today.  He had to lay down on some pallets at [place] because he was so dizzy and lightheaded.  On arrival his blood pressure was 948 systolic but it dropped down into the 90s for repeated readings and even down to 89 at one point here in the emergency room.  He denies any vertigo only lightheadedness.  He has had the symptoms before and takes Antivert for them but is out of that now.  He has had hypotension before and was only taking Antivert for it.  He normally is hypertensive and takes lisinopril for the hypertension.         Past Medical History:  Diagnosis Date  . Back ache   . COPD (chronic obstructive pulmonary disease) ([place])   . Hypercholesteremia   . Hypertension     Patient Active Problem List   Diagnosis Date Noted  . Hypotension 09/30/2020    Past Surgical History:  Procedure Laterality Date  . AORTIC VALVE REPLACEMENT    . BACK SURGERY    . CARDIAC SURGERY     CABG X4    Prior to Admission medications   Medication Sig Start Date End Date Taking? Authorizing Provider  atorvastatin (LIPITOR) 80 MG tablet Take 80 mg by mouth daily.    [provider]  Fluticasone-Salmeterol (ADVAIR) 250-50 MCG/DOSE AEPB Inhale 1 puff into the lungs 2 (two) times daily.    [provider]  HYDROcodone-acetaminophen (NORCO/VICODIN) 5-325 MG tablet 1-2 tabs po bid prn 04/25/20   Norval Gable, MD  ipratropium-albuterol (DUONEB) 0.5-2.5 (3) MG/3ML SOLN Take 3 mLs by nebulization.    [provider]   lisinopril (PRINIVIL,ZESTRIL) 10 MG tablet Take 10 mg by mouth daily.    [provider]  oseltamivir (TAMIFLU) 75 MG capsule Take 1 capsule (75 mg total) by mouth every 12 (twelve) hours. 11/18/18   Coral Spikes, DO  predniSONE (DELTASONE) 50 MG tablet 1 tablet daily x 5 days 11/18/18   Coral Spikes, DO  sertraline (ZOLOFT) 100 MG tablet Take 100 mg by mouth daily.    [provider]  tiotropium (SPIRIVA) 18 MCG inhalation capsule Place 18 mcg into inhaler and inhale daily.    [provider]  traZODone (DESYREL) 50 MG tablet Take 50 mg by mouth at bedtime.    [provider]    Allergies Contrast media  [iodinated diagnostic agents] and Aspirin  Family History  Problem Relation Age of Onset  . Lung disease Mother   . Cancer Father     Social History Social History   Tobacco Use  . Smoking status: Former Research scientist (life sciences)  . Smokeless tobacco: Never Used  Vaping Use  . Vaping Use: Never used  Substance Use Topics  . Alcohol use: No  . Drug use: Never    Review of Systems  Constitutional: No fever/chills Eyes: No visual changes. ENT: No sore throat. Cardiovascular: Denies chest pain. Respiratory: Denies shortness of breath. Gastrointestinal: No abdominal pain.  No nausea, no vomiting.  No diarrhea.  No  constipation. Genitourinary: Negative for dysuria. Musculoskeletal: Negative for back pain. Skin: Negative for rash. Neurological: Negative for headaches, focal weakness   ____________________________________________   PHYSICAL EXAM:  VITAL SIGNS: ED Triage Vitals  Enc Vitals Group     BP 09/29/20 1540 (!) 121/96     Pulse Rate 09/29/20 1540 74     Resp 09/29/20 1540 16     Temp --      Temp src --      SpO2 09/29/20 1540 100 %     Weight 09/29/20 1541 135 lb (61.2 kg)     Height 09/29/20 1541 5\' 6"  (1.676 m)     Head Circumference --      Peak Flow --      Pain Score 09/29/20 1541 0     Pain Loc --      Pain Edu? --       Excl. in [place]? --     Constitutional: Alert and oriented. Well appearing and in no acute distress. Eyes: Conjunctivae are normal.  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  The right eye does not move as well as the left. Head: Atraumatic. Nose: No congestion/rhinnorhea. Mouth/Throat: Mucous membranes are moist.  Oropharynx non-erythematous. Neck: No stridor.   Cardiovascular: Normal rate, regular rhythm. Grossly normal heart sounds.  Good peripheral circulation. Respiratory: Normal respiratory effort.  No retractions. Lungs CTAB. Gastrointestinal: Soft and nontender. No distention. No abdominal bruits. No CVA tenderness. Musculoskeletal: No lower extremity tenderness nor edema.   Neurologic:  Normal speech and language. No gross focal neurologic deficits are appreciated.  Skin:  Skin is warm, dry and intact. No rash noted.   ____________________________________________   LABS (all labs ordered are listed, but only abnormal results are displayed)  Labs Reviewed  BASIC METABOLIC PANEL - Abnormal; Notable for the following components:      Result Value   Potassium 5.5 (*)    Glucose, Bld 102 (*)    BUN 55 (*)    Creatinine, Ser 2.08 (*)    GFR, Estimated 32 (*)    All other components within normal limits  CBC - Abnormal; Notable for the following components:   Platelets 70 (*)    All other components within normal limits  URINALYSIS, COMPLETE (UACMP) WITH MICROSCOPIC - Abnormal; Notable for the following components:   Color, Urine YELLOW (*)    APPearance CLEAR (*)    All other components within normal limits  TROPONIN I (HIGH SENSITIVITY) - Abnormal; Notable for the following components:   Troponin I (High Sensitivity) 30 (*)    All other components within normal limits  TROPONIN I (HIGH SENSITIVITY) - Abnormal; Notable for the following components:   Troponin I (High Sensitivity) 30 (*)    All other components within normal limits  RESPIRATORY PANEL BY  RT PCR (FLU A&B, COVID)  HEPATIC FUNCTION PANEL  LACTIC ACID, PLASMA  BASIC METABOLIC PANEL  CBC  CBG MONITORING, ED   ____________________________________________  EKG  EKG #1 read interpreted by me shows normal sinus rhythm rate of 72 with flipped T's in 1 and L and in V2 through V5.  When compared to EKG from 11 November 2019 he flipped T's in 1 and L are old he had flipped T's in several of the chest leads but really V4 through 6 at that time. EKG #2 was done 5 minutes after EKG #1 apparently in triage read interpreted by me shows normal sinus rhythm rate  of 71 normal axis no significant change from EKG #1 _EKG #3 was done at 2213 after patient blood pressure had dropped is also similar to the previous 2 EKGs ___________________________________________  RADIOLOGY Gertha Calkin, personally viewed and evaluated these images (plain radiographs) as part of my medical decision making, as well as reviewing the written report by the radiologist.  ED MD interpretation:    Official radiology report(s): No results found.  ____________________________________________   PROCEDURES  Procedure(s) performed (including Critical Care):  Procedures   ____________________________________________   INITIAL IMPRESSION / ASSESSMENT AND PLAN / ED COURSE  Patient with history of hypertension has been hypotensive several times here in the emergency room with a blood pressure at least once down into the high 80s.  His creatinine is 2 but it has been in this range previously.  His BUN is 55 which is slightly higher than the previous high reading of 48.  Patient's not losing blood per rectum at least not currently in his stool is Hemoccult negative.  His troponins are stable at 30 lactic acid is not elevated I am uncertain why his blood pressure is low I do not want to send him out on his Antivert for dizziness which is today at least sounding like hypotension.  I also do not want to refill his  lisinopril and his blood pressure is so low.  I am worried he might drop his pressure lower and stroke out.  I would like to observe this gentleman overnight to see if any etiology develops for his hypotension.  He can also get more fluids while we are waiting to see what develops.              ____________________________________________   FINAL CLINICAL IMPRESSION(S) / ED DIAGNOSES  Final diagnoses:  Dizziness  Hypotension, unspecified hypotension type     ED Discharge Orders    None      *Please note:  Mario Patrick was evaluated in Emergency Department on 09/30/2020 for the symptoms described in the history of present illness. He was evaluated in the context of the global COVID-19 pandemic, which necessitated consideration that the patient might be at risk for infection with the SARS-CoV-2 virus that causes COVID-19. Institutional protocols and algorithms that pertain to the evaluation of patients at risk for COVID-19 are in a state of rapid change based on information released by regulatory bodies including the CDC and federal and state organizations. These policies and algorithms were followed during the patient's care in the ED.  Some ED evaluations and interventions may be delayed as a result of limited staffing during and the pandemic.*   Note:  This document was prepared using Dragon voice recognition software and may include unintentional dictation errors.    Nena Polio, MD 09/30/20 [phone]

## 2020-09-29 NOTE — ED Triage Notes (Signed)
Pt arrived via [facility] with c/o feeling dizzy and lightheaded. Pt BP now 121/96. Pt A&Ox4.

## 2020-09-29 NOTE — ED Triage Notes (Signed)
Pt comes into the ED via EMS from [place] with c/o feeling dizzy/lightheaded, 90/56, now 120/76, CBG 143. #20LAC... pt is alert on arrival.

## 2020-09-29 NOTE — ED Notes (Signed)
Dr. Cinda Quest at bedside.

## 2020-09-30 ENCOUNTER — Observation Stay
Admit: 2020-09-30 | Discharge: 2020-09-30 | Disposition: A | Discharge status: Home / Self Care | Payer: Medicare HMO | Attending: Family Medicine | Admitting: Family Medicine

## 2020-09-30 DIAGNOSIS — Z20822 Contact with and (suspected) exposure to covid-19: Secondary | ICD-10-CM | POA: Diagnosis present

## 2020-09-30 DIAGNOSIS — Z7952 Long term (current) use of systemic steroids: Secondary | ICD-10-CM | POA: Diagnosis not present

## 2020-09-30 DIAGNOSIS — R42 Dizziness and giddiness: Secondary | ICD-10-CM

## 2020-09-30 DIAGNOSIS — Z7951 Long term (current) use of inhaled steroids: Secondary | ICD-10-CM | POA: Diagnosis not present

## 2020-09-30 DIAGNOSIS — Z952 Presence of prosthetic heart valve: Secondary | ICD-10-CM | POA: Diagnosis not present

## 2020-09-30 DIAGNOSIS — I951 Orthostatic hypotension: Secondary | ICD-10-CM | POA: Diagnosis present

## 2020-09-30 DIAGNOSIS — Z87891 Personal history of nicotine dependence: Secondary | ICD-10-CM | POA: Diagnosis not present

## 2020-09-30 DIAGNOSIS — E7800 Pure hypercholesterolemia, unspecified: Secondary | ICD-10-CM | POA: Diagnosis present

## 2020-09-30 DIAGNOSIS — I959 Hypotension, unspecified: Secondary | ICD-10-CM | POA: Diagnosis not present

## 2020-09-30 DIAGNOSIS — E875 Hyperkalemia: Secondary | ICD-10-CM | POA: Diagnosis present

## 2020-09-30 DIAGNOSIS — Z951 Presence of aortocoronary bypass graft: Secondary | ICD-10-CM | POA: Diagnosis not present

## 2020-09-30 DIAGNOSIS — N179 Acute kidney failure, unspecified: Secondary | ICD-10-CM

## 2020-09-30 DIAGNOSIS — E785 Hyperlipidemia, unspecified: Secondary | ICD-10-CM | POA: Diagnosis present

## 2020-09-30 DIAGNOSIS — F32A Depression, unspecified: Secondary | ICD-10-CM | POA: Diagnosis present

## 2020-09-30 DIAGNOSIS — J449 Chronic obstructive pulmonary disease, unspecified: Secondary | ICD-10-CM | POA: Diagnosis present

## 2020-09-30 DIAGNOSIS — Z79899 Other long term (current) drug therapy: Secondary | ICD-10-CM | POA: Diagnosis not present

## 2020-09-30 DIAGNOSIS — Z91041 Radiographic dye allergy status: Secondary | ICD-10-CM | POA: Diagnosis not present

## 2020-09-30 DIAGNOSIS — I129 Hypertensive chronic kidney disease with stage 1 through stage 4 chronic kidney disease, or unspecified chronic kidney disease: Secondary | ICD-10-CM | POA: Diagnosis present

## 2020-09-30 DIAGNOSIS — N1832 Chronic kidney disease, stage 3b: Secondary | ICD-10-CM | POA: Diagnosis present

## 2020-09-30 DIAGNOSIS — E861 Hypovolemia: Secondary | ICD-10-CM | POA: Diagnosis not present

## 2020-09-30 DIAGNOSIS — Z886 Allergy status to analgesic agent status: Secondary | ICD-10-CM | POA: Diagnosis not present

## 2020-09-30 DIAGNOSIS — I9589 Other hypotension: Secondary | ICD-10-CM | POA: Diagnosis not present

## 2020-09-30 DIAGNOSIS — E869 Volume depletion, unspecified: Secondary | ICD-10-CM | POA: Diagnosis present

## 2020-09-30 DIAGNOSIS — I2510 Atherosclerotic heart disease of native coronary artery without angina pectoris: Secondary | ICD-10-CM | POA: Diagnosis present

## 2020-09-30 DIAGNOSIS — D696 Thrombocytopenia, unspecified: Secondary | ICD-10-CM | POA: Diagnosis present

## 2020-09-30 DIAGNOSIS — I739 Peripheral vascular disease, unspecified: Secondary | ICD-10-CM | POA: Diagnosis present

## 2020-09-30 LAB — URINALYSIS, COMPLETE (UACMP) WITH MICROSCOPIC
Bacteria, UA: NONE SEEN
Bilirubin Urine: NEGATIVE
Glucose, UA: NEGATIVE mg/dL
Hgb urine dipstick: NEGATIVE
Ketones, ur: NEGATIVE mg/dL
Leukocytes,Ua: NEGATIVE
Nitrite: NEGATIVE
Protein, ur: NEGATIVE mg/dL
Specific Gravity, Urine: 1.014 (ref 1.005–1.030)
Squamous Epithelial / HPF: NONE SEEN (ref 0–5)
pH: 5 (ref 5.0–8.0)

## 2020-09-30 LAB — HEPATIC FUNCTION PANEL
ALT: 14 U/L (ref 0–44)
AST: 18 U/L (ref 15–41)
Albumin: 3.5 g/dL (ref 3.5–5.0)
Alkaline Phosphatase: 58 U/L (ref 38–126)
Bilirubin, Direct: <0.1 mg/dL (ref 0.0–0.2)
Total Bilirubin: 0.7 mg/dL (ref 0.3–1.2)
Total Protein: 5.9 g/dL — ABNORMAL LOW (ref 6.5–8.1)

## 2020-09-30 LAB — BASIC METABOLIC PANEL
Anion gap: 6 (ref 5–15)
BUN: 49 mg/dL — ABNORMAL HIGH (ref 8–23)
CO2: 25 mmol/L (ref 22–32)
Calcium: 8.7 mg/dL — ABNORMAL LOW (ref 8.9–10.3)
Chloride: 107 mmol/L (ref 98–111)
Creatinine, Ser: 1.96 mg/dL — ABNORMAL HIGH (ref 0.61–1.24)
GFR, Estimated: 34 mL/min — ABNORMAL LOW (ref >60)
Glucose, Bld: 101 mg/dL — ABNORMAL HIGH (ref 70–99)
Potassium: 4.9 mmol/L (ref 3.5–5.1)
Sodium: 138 mmol/L (ref 135–145)

## 2020-09-30 LAB — TROPONIN I (HIGH SENSITIVITY)
Troponin I (High Sensitivity): 30 ng/L — ABNORMAL HIGH (ref <18)
Troponin I (High Sensitivity): 41 ng/L — ABNORMAL HIGH (ref <18)
Troponin I (High Sensitivity): 46 ng/L — ABNORMAL HIGH (ref <18)

## 2020-09-30 LAB — CBC
HCT: 40.5 % (ref 39.0–52.0)
Hemoglobin: 13.2 g/dL (ref 13.0–17.0)
MCH: 29.7 pg (ref 26.0–34.0)
MCHC: 32.6 g/dL (ref 30.0–36.0)
MCV: 91.2 fL (ref 80.0–100.0)
Platelets: 58 10*3/uL — ABNORMAL LOW (ref 150–400)
RBC: 4.44 MIL/uL (ref 4.22–5.81)
RDW: 13.5 % (ref 11.5–15.5)
WBC: 5.4 10*3/uL (ref 4.0–10.5)
nRBC: 0 % (ref 0.0–0.2)

## 2020-09-30 LAB — RESPIRATORY PANEL BY RT PCR (FLU A&B, COVID)
Influenza A by PCR: NEGATIVE
Influenza B by PCR: NEGATIVE
SARS Coronavirus 2 by RT PCR: NEGATIVE

## 2020-09-30 LAB — LIPID PANEL
Cholesterol: 173 mg/dL (ref 0–200)
HDL: 37 mg/dL — ABNORMAL LOW (ref >40)
LDL Cholesterol: 123 mg/dL — ABNORMAL HIGH (ref 0–99)
Total CHOL/HDL Ratio: 4.7 RATIO
Triglycerides: 63 mg/dL (ref <150)
VLDL: 13 mg/dL (ref 0–40)

## 2020-09-30 MED ORDER — IPRATROPIUM-ALBUTEROL 0.5-2.5 (3) MG/3ML IN SOLN: 3.0000 mL | RESPIRATORY_TRACT | Status: DC | PRN

## 2020-09-30 MED ORDER — MOMETASONE FURO-FORMOTEROL FUM 200-5 MCG/ACT IN AERO
2.0000 | INHALATION_SPRAY | Freq: Two times a day (BID) | RESPIRATORY_TRACT | Status: DC
  Administered 2020-09-30: 22:00:00 2 via RESPIRATORY_TRACT
  Filled 2020-09-30: qty 8.8

## 2020-09-30 MED ORDER — MAGNESIUM HYDROXIDE 400 MG/5ML PO SUSP
30.0000 mL | Freq: Every day | ORAL | Status: DC | PRN
  Filled 2020-09-30: qty 30

## 2020-09-30 MED ORDER — SODIUM CHLORIDE 0.9 % IV SOLN: INTRAVENOUS | Status: DC

## 2020-09-30 MED ORDER — SERTRALINE HCL 50 MG PO TABS
100.0000 mg | ORAL_TABLET | Freq: Every day | ORAL | Status: DC
  Administered 2020-09-30 – 2020-10-01 (×2): 100 mg via ORAL
  Filled 2020-09-30 (×2): qty 2

## 2020-09-30 MED ORDER — TRAZODONE HCL 50 MG PO TABS
50.0000 mg | ORAL_TABLET | Freq: Every day | ORAL | Status: DC
  Administered 2020-09-30 (×2): 50 mg via ORAL
  Filled 2020-09-30 (×2): qty 1

## 2020-09-30 MED ORDER — LISINOPRIL 10 MG PO TABS: 10.0000 mg | ORAL_TABLET | Freq: Every day | ORAL | Status: DC

## 2020-09-30 MED ORDER — ONDANSETRON HCL 4 MG/2ML IJ SOLN: 4.0000 mg | Freq: Four times a day (QID) | INTRAMUSCULAR | Status: DC | PRN

## 2020-09-30 MED ORDER — ONDANSETRON HCL 4 MG PO TABS: 4.0000 mg | ORAL_TABLET | Freq: Four times a day (QID) | ORAL | Status: DC | PRN

## 2020-09-30 MED ORDER — HYDROCODONE-ACETAMINOPHEN 5-325 MG PO TABS
1.0000 | ORAL_TABLET | ORAL | Status: DC | PRN
  Administered 2020-09-30: 1 via ORAL
  Filled 2020-09-30: qty 1

## 2020-09-30 MED ORDER — ACETAMINOPHEN 325 MG PO TABS: 650.0000 mg | ORAL_TABLET | Freq: Four times a day (QID) | ORAL | Status: DC | PRN

## 2020-09-30 MED ORDER — ACETAMINOPHEN 650 MG RE SUPP: 650.0000 mg | Freq: Four times a day (QID) | RECTAL | Status: DC | PRN

## 2020-09-30 MED ORDER — ENOXAPARIN SODIUM 30 MG/0.3ML ~~LOC~~ SOLN: 30.0000 mg | SUBCUTANEOUS | Status: DC

## 2020-09-30 MED ORDER — TRAZODONE HCL 50 MG PO TABS: 25.0000 mg | ORAL_TABLET | Freq: Every evening | ORAL | Status: DC | PRN

## 2020-09-30 MED ORDER — ATORVASTATIN CALCIUM 20 MG PO TABS
80.0000 mg | ORAL_TABLET | Freq: Every day | ORAL | Status: DC
  Administered 2020-09-30: 22:00:00 80 mg via ORAL
  Filled 2020-09-30: qty 4

## 2020-09-30 MED ORDER — LABETALOL HCL 5 MG/ML IV SOLN: 20.0000 mg | INTRAVENOUS | Status: DC | PRN

## 2020-09-30 MED ORDER — TIOTROPIUM BROMIDE MONOHYDRATE 18 MCG IN CAPS
18.0000 ug | ORAL_CAPSULE | Freq: Every day | RESPIRATORY_TRACT | Status: DC
  Filled 2020-09-30: qty 5

## 2020-09-30 NOTE — Care Management Obs Status (Signed)
[place] NOTIFICATION   Patient Details  Name: Mario Patrick MRN: [id number] Date of Birth: December 01, 1940   Medicare Observation Status Notification Given:  Yes    Bassem Bernasconi E Shevaun Lovan, LCSW 09/30/2020, 11:23 AM

## 2020-09-30 NOTE — H&P (Signed)
[place]   PATIENT NAME: Mario Patrick    MR#:  [id number]  DATE OF BIRTH:  05/09/1941  DATE OF ADMISSION:  09/29/2020  PRIMARY CARE PHYSICIAN: Sharyne Peach, MD   REQUESTING/REFERRING PHYSICIAN: Conni Slipper, MD CHIEF COMPLAINT:   Chief Complaint  Patient presents with  . Dizziness    HISTORY OF PRESENT ILLNESS:  Mario Patrick  is a 79 y.o. 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  He denied any nausea or vomiting or diarrhea or abdominal pain.  He has been having diminished appetite.  No chest pain or dyspnea or cough or wheezing.  No paresthesias or focal muscle weakness.  Upon presentation to the emergency room his vital signs were normal.  His blood pressures dropped to 96/59 and reportedly his systolic was in the 03B per the ER physician.  It has been improving with IV fluids.  His labs revealed mild hyperkalemia of 5.5 and BUN of 55 with creatinine of 2 higher than previous levels.  Stool Hemoccult came back Negative.  High-sensitivity troponin was 39 on the same lactic acid was 1.2.  CBC showed thrombocytopenia of 70.  Influenza antigens and COVID-19 PCR came back negative.  UA was unremarkable. EKG showed normal sinus rhythm with rate of 72 Stacks deviation until conversion anterolaterally with LVH.  Repeat EKG later similar findings with sinus rhythm with rate of 62 probable left atrial enlargement and LVH.  The patient was given 1 L bolus of IV normal saline.  He will be placed in observation and medical monitored bed for further evaluation and management. PAST MEDICAL HISTORY:   Past Medical History:  Diagnosis Date  . Back ache   . COPD (chronic obstructive pulmonary disease) ([place])   . Hypercholesteremia   . Hypertension     PAST SURGICAL HISTORY:    Past Surgical History:  Procedure Laterality Date  . AORTIC VALVE REPLACEMENT    . BACK SURGERY    . CARDIAC SURGERY     CABG X4    SOCIAL HISTORY:   Social History   Tobacco Use  . Smoking status: Former Research scientist (life sciences)  . Smokeless tobacco: Never Used  Substance Use Topics  . Alcohol use: No    FAMILY HISTORY:   Family History  Problem Relation Age of Onset  . Lung disease Mother   . Cancer Father     DRUG ALLERGIES:   Allergies  Allergen Reactions  . Contrast Media  [Iodinated Diagnostic Agents] Nausea Only    GI upset   . Aspirin Other (See Comments)    thrombocytopenia    REVIEW OF SYSTEMS:   ROS As per history of present illness. All pertinent systems were reviewed above. Constitutional, HEENT, cardiovascular, respiratory, GI, GU, musculoskeletal, neuro, psychiatric, endocrine, integumentary and hematologic systems were reviewed and are otherwise negative/unremarkable except for positive findings mentioned above in the HPI.   MEDICATIONS AT HOME:   Prior to Admission medications   Medication Sig Start Date End Date Taking? Authorizing Provider  atorvastatin (LIPITOR) 80 MG tablet Take 80 mg by mouth daily.    [provider]  Fluticasone-Salmeterol (ADVAIR) 250-50 MCG/DOSE AEPB Inhale 1 puff into the lungs 2 (two) times daily.    [provider]  HYDROcodone-acetaminophen (NORCO/VICODIN) 5-325 MG tablet 1-2 tabs po bid prn 04/25/20   [place], [place],  MD  ipratropium-albuterol (DUONEB) 0.5-2.5 (3) MG/3ML SOLN Take 3 mLs by nebulization.    [provider]  lisinopril (PRINIVIL,ZESTRIL) 10 MG tablet Take 10 mg by mouth daily.    [provider]  oseltamivir (TAMIFLU) 75 MG capsule Take 1 capsule (75 mg total) by mouth every 12 (twelve) hours. 11/18/18   Coral Spikes, DO  predniSONE (DELTASONE) 50 MG tablet 1 tablet daily x 5 days 11/18/18   Coral Spikes, DO  sertraline (ZOLOFT) 100 MG tablet Take 100 mg by mouth daily.     [provider]  tiotropium (SPIRIVA) 18 MCG inhalation capsule Place 18 mcg into inhaler and inhale daily.    [provider]  traZODone (DESYREL) 50 MG tablet Take 50 mg by mouth at bedtime.    [provider]      VITAL SIGNS:  Blood pressure 107/88, pulse 64, temperature 97.6 F (36.4 C), temperature source Oral, resp. rate 19, height 5\' 6"  (1.676 m), weight 61.2 kg, SpO2 99 %.  PHYSICAL EXAMINATION:  Physical Exam  GENERAL:  79 y.o.-year-old Caucasian male patient lying in the bed with no acute distress.  EYES: Pupils equal, round, reactive to light and accommodation. No scleral icterus. Extraocular muscles intact.  HEENT: Head atraumatic, normocephalic. Oropharynx and nasopharynx clear.  NECK:  Supple, no jugular venous distention. No thyroid enlargement, no tenderness.  LUNGS: Normal breath sounds bilaterally, no wheezing, rales,rhonchi or crepitation. No use of accessory muscles of respiration.  CARDIOVASCULAR: Regular rate and rhythm, S1, S2 normal. No murmurs, rubs, or gallops.  ABDOMEN: Soft, nondistended, nontender. Bowel sounds present. No organomegaly or mass.  EXTREMITIES: No pedal edema, cyanosis, or clubbing.  NEUROLOGIC: Cranial nerves II through XII are intact. Muscle strength 5/5 in all extremities. Sensation intact. Gait not checked.  PSYCHIATRIC: The patient is alert and oriented x 3.  Normal affect and good eye contact. SKIN: No obvious rash, lesion, or ulcer.   LABORATORY PANEL:   CBC Recent Labs  Lab 09/29/20 1538  WBC 7.4  HGB 14.2  HCT 42.8  PLT 70*   ------------------------------------------------------------------------------------------------------------------  Chemistries  Recent Labs  Lab 09/29/20 1538 09/29/20 2211  NA 136  --   K 5.5*  --   CL 100  --   CO2 26  --   GLUCOSE 102*  --   BUN 55*  --   CREATININE 2.08*  --   CALCIUM 9.1  --   AST  --  20  ALT  --  19  ALKPHOS  --  67  BILITOT  --  0.8    ------------------------------------------------------------------------------------------------------------------  Cardiac Enzymes No results for input(s): TROPONINI in the last 168 hours. ------------------------------------------------------------------------------------------------------------------  RADIOLOGY:  No results found.    IMPRESSION AND PLAN:   1.  Dizziness with associated hypotension and volume depletion. -The patient will be placed in observation in a medically monitored bed. -We will continue hydration with half-normal saline. -We will check orthostatics. -She will be placed on as needed Antivert.  2.  Acute kidney injury that is likely prerenal with associated hyperkalemia. -The patient will be hydrated with IV normal saline and will follow BMPs. -We will hold off lisinopril and avoid nephrotoxins.  3.  COPD without exacerbation. -We will continue his bronchodilator inhalers.  4.  Hypertension. -We will hold his lisinopril and place him on as needed IV labetalol.  5.  Dyslipidemia. -We will continue statin therapy and Zetia.  6.  Depression. -We will continue Zoloft and Remeron.  7.  Prophylaxis. -Subcutaneous Lovenox.    All the records are reviewed and case discussed with ED provider. The plan of care was discussed in details with the patient (and family). I answered all questions. The patient agreed to proceed with the above mentioned plan. Further management will depend upon hospital course.   CODE STATUS: Full code  Status is: Observation  The patient remains OBS appropriate and will d/c before 2 midnights.  Dispo: The patient is from: Home              Anticipated d/c is to: Home              Anticipated d/c date is: 1 day              Patient currently is not medically stable to d/c.   TOTAL TIME TAKING CARE OF THIS PATIENT:55 minutes.    Christel Mormon M.D on 09/30/2020 at 1:06 AM  [facility]   From 7 PM-7 AM, contact  night-coverage [email or web address]  CC: Primary care physician; Sharyne Peach, MD

## 2020-09-30 NOTE — Progress Notes (Signed)
Report given to Chris, RN

## 2020-09-30 NOTE — Evaluation (Signed)
Physical Therapy Evaluation Patient Details Name: ARLYNN MCDERMID MRN: [id number] DOB: [birth year] Today's Date: 09/30/2020   History of Present Illness  79 y.o. 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  Clinical Impression  Pt showed good effort and motivation t/o the session and though he was a little slower and more limited than his baseline he was safe and generally showed good confidence. He should be safe to return home and has a good support network (neighbors, etc) that are apparently very willing to help as needed.  He will benefit from some HHPT to get back to his baseline activity level, pt interested in this but again is safe to go home and is eager to do so later today.     Follow Up Recommendations Home health PT;Supervision - Intermittent    Equipment Recommendations  None recommended by PT    Recommendations for Other Services       Precautions / Restrictions Precautions Precautions: Fall Restrictions Weight Bearing Restrictions: No      Mobility  Bed Mobility Overal bed mobility: Independent             General bed mobility comments: easily gets to sitting EOB w/o assist    Transfers Overall transfer level: Independent Equipment used: None             General transfer comment: Pt able to rise to standing w/o AD with confidence and good safety awareness  Ambulation/Gait Ambulation/Gait assistance: Modified independent (Device/Increase time) Gait Distance (Feet): 175 Feet Assistive device: None       General Gait Details: Pt able to maintain consistent cadence with no LOBs during "prolonged" bout of ambulation.  He reports being slower and more cautious than baseline but had no overt safety issues.  Minimal fatigue with O2 and HR remaining stalble  and WNL.  Stairs Stairs: Yes Stairs assistance: Modified independent (Device/Increase time) Stair Management: One rail Right Number of Stairs: 6 General stair comments: Pt was able to negotiate up/down the steps with good confidence and safety  Wheelchair Mobility    Modified Rankin (Stroke Patients Only)       Balance Overall balance assessment: Needs assistance;Mild deficits observed, not formally tested Sitting-balance support: No upper extremity supported;Feet supported Sitting balance-Leahy Scale: Normal     Standing balance support: No upper extremity supported Standing balance-Leahy Scale: Good                               Pertinent Vitals/Pain Pain Assessment: No/denies pain    Home Living Family/patient expects to be discharged to:: Private residence Living Arrangements: Non-relatives/Friends Available Help at Discharge: Family;Friend(s);Neighbor;Available PRN/intermittently Type of Home: House Home Access: Stairs to enter Entrance Stairs-Rails: Right Entrance Stairs-Number of Steps: 3 Home Layout: Two level;Able to live on main level with bedroom/bathroom Home Equipment: Shower seat - built in      Prior Function Level of Independence: Independent         Comments: Independent, driving, retired, 4 falls in past 12 mo (stumbles)     Hand Dominance   Dominant Hand: Left    Extremity/Trunk Assessment   Upper Extremity Assessment Upper Extremity Assessment: Overall WFL for tasks assessed    Lower Extremity Assessment Lower Extremity Assessment: Overall WFL for tasks assessed    Cervical /  Trunk Assessment Cervical / Trunk Assessment: Normal  Communication   Communication: No difficulties  Cognition Arousal/Alertness: Awake/alert Behavior During Therapy: WFL for tasks assessed/performed Overall Cognitive Status: Within Functional Limits for tasks assessed                                        General  Comments      Exercises     Assessment/Plan    PT Assessment Patient needs continued PT services  PT Problem List Decreased strength;Decreased range of motion;Decreased activity tolerance;Decreased balance;Decreased mobility;Decreased safety awareness       PT Treatment Interventions Gait training;Stair training;Functional mobility training;Therapeutic activities;Therapeutic exercise;Balance training;Neuromuscular re-education;Patient/family education    PT Goals (Current goals can be found in the Care Plan section)  Acute Rehab PT Goals Patient Stated Goal: go home PT Goal Formulation: With patient Time For Goal Achievement: 09/30/20 Potential to Achieve Goals: Good    Frequency Min 2X/week   Barriers to discharge        Co-evaluation               AM-PAC PT "6 Clicks" Mobility  Outcome Measure Help needed turning from your back to your side while in a flat bed without using bedrails?: None Help needed moving from lying on your back to sitting on the side of a flat bed without using bedrails?: None Help needed moving to and from a bed to a chair (including a wheelchair)?: None Help needed standing up from a chair using your arms (e.g., wheelchair or bedside chair)?: None Help needed to walk in hospital room?: None Help needed climbing 3-5 steps with a railing? : None 6 Click Score: 24    End of Session Equipment Utilized During Treatment: Gait belt Activity Tolerance: Patient tolerated treatment well Patient left: with bed alarm set;with call bell/phone within reach   PT Visit Diagnosis: Difficulty in walking, not elsewhere classified (R26.2);Unsteadiness on feet (R26.81)    Time: 1916-6060 PT Time Calculation (min) (ACUTE ONLY): 18 min   Charges:   PT Evaluation $PT Eval Low Complexity: 1 Low PT Treatments $Gait Training: 8-22 mins        Kreg Shropshire, DPT 09/30/2020, 1:58 PM

## 2020-09-30 NOTE — Progress Notes (Signed)
Pt requesting to have bed alarm turned off. Pt states he will be likely be going home today and "does not need it." Pt educated on fall risk. Aware of need to call for assistance if feeling weak or dizzy.

## 2020-09-30 NOTE — Evaluation (Signed)
Occupational Therapy Evaluation Patient Details Name: Mario Patrick MRN: [id number] DOB: May 23, 1941 Today's Date: 09/30/2020    History of Present Illness 79 y.o. 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   Clinical Impression   Pt was seen for OT evaluation this date. Prior to hospital admission, pt was independent. Pt lives on the main floor of his 2-story home with a roommate who lives on the 2nd floor. Endorses multiple falls this year 2/2 stumbling. Currently pt demonstrates very mild impairments in balance but is able to ambulate without AD and no LOB. Pt near baseline functional independence. Do not anticipate need for additional skilled OT services at this time. Will sign off.  Thank you for this consult.      Follow Up Recommendations  No OT follow up    Equipment Recommendations  None recommended by OT    Recommendations for Other Services       Precautions / Restrictions Precautions Precautions: Fall Restrictions Weight Bearing Restrictions: No      Mobility Bed Mobility Overal bed mobility: Independent                  Transfers Overall transfer level: Independent                    Balance Overall balance assessment: Needs assistance Sitting-balance support: No upper extremity supported;Feet supported Sitting balance-Leahy Scale: Normal     Standing balance support: No upper extremity supported Standing balance-Leahy Scale: Good                             ADL either performed or assessed with clinical judgement   ADL Overall ADL's : Modified independent                                             Vision Baseline Vision/History: Wears glasses Wears Glasses: At all times Patient Visual Report: No change  from baseline       Perception     Praxis      Pertinent Vitals/Pain Pain Assessment: No/denies pain     Hand Dominance Left   Extremity/Trunk Assessment Upper Extremity Assessment Upper Extremity Assessment: Overall WFL for tasks assessed (hx R shoulder RTC injury resulting in decreased AROM shoulder but not significantly functionally limiting)   Lower Extremity Assessment Lower Extremity Assessment: Overall WFL for tasks assessed   Cervical / Trunk Assessment Cervical / Trunk Assessment: Normal   Communication Communication Communication: No difficulties   Cognition Arousal/Alertness: Awake/alert Behavior During Therapy: WFL for tasks assessed/performed Overall Cognitive Status: Within Functional Limits for tasks assessed                                     General Comments       Exercises     Shoulder Instructions      Home Living Family/patient expects to be discharged to:: Private residence Living Arrangements: Non-relatives/Friends Available Help at Discharge: Family;Friend(s);Neighbor;Available PRN/intermittently Type of Home: House Home Access: Stairs to enter [employer] of Steps: 3 Entrance Stairs-Rails: Can reach both Home Layout: Two level;Able to  live on main level with bedroom/bathroom     Bathroom Shower/Tub: Occupational psychologist: Handicapped height     Home Equipment: [place] in          Prior Functioning/Environment Level of Independence: Independent        Comments: Independent, driving, retired, 4 falls in past 12 mo (stumbles)        OT Problem List: Impaired balance (sitting and/or standing)      OT Treatment/Interventions:      OT Goals(Current goals can be found in the care plan section) Acute Rehab OT Goals Patient Stated Goal: go home OT Goal Formulation: All assessment and education complete, DC therapy  OT Frequency:     Barriers to D/C:             Co-evaluation              AM-PAC OT "6 Clicks" Daily Activity     Outcome Measure Help from another person eating meals?: None Help from another person taking care of personal grooming?: None Help from another person toileting, which includes using toliet, bedpan, or urinal?: None Help from another person bathing (including washing, rinsing, drying)?: None Help from another person to put on and taking off regular upper body clothing?: None Help from another person to put on and taking off regular lower body clothing?: None 6 Click Score: 24   End of Session Nurse Communication: Mobility status  Activity Tolerance: Patient tolerated treatment well Patient left: in bed;with call bell/phone within reach  OT Visit Diagnosis: Other abnormalities of gait and mobility (R26.89)                Time: 2951-8841 OT Time Calculation (min): 13 min Charges:  OT General Charges $OT Visit: 1 Visit OT Evaluation $OT Eval Low Complexity: 1 Low  Jeni Salles, MPH, MS, OTR/L ascom [phone] 09/30/20, 1:06 PM

## 2020-09-30 NOTE — Progress Notes (Signed)
PROGRESS NOTE    Mario Patrick  LXB:[id number] DOB: [birth year] DOA: 09/29/2020 PCP: Sharyne Peach, MD   Chief Complaint  Patient presents with  . Dizziness   Brief Narrative: Mario Patrick  is Mario Patrick 79 y.o. 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  He denied any nausea or vomiting or diarrhea or abdominal pain.  He has been having diminished appetite.  No chest pain or dyspnea or cough or wheezing.  No paresthesias or focal muscle weakness.  Upon presentation to the emergency room his vital signs were normal.  His blood pressures dropped to 96/59 and reportedly his systolic was in the 41U per the ER physician.  It has been improving with IV fluids.  Stool Hemoccult came back Negative.  He was admitted for further workup of his hypotension.  Assessment & Plan:   Active Problems:   Hypotension   # Orthostatic Hypotension  - describes about 3 months of on/off lightheadedness - also describes orthostasis - denies vertigo -- BP has improved with IVF -- follow echo  -- STOP lisinopril given hypotension   -- EKG as noted below -- orthostatics, PT/OT  # Abnormal EKG  Elevated Troponin  Hx CAD s/p CABG: EKG with T wave inversions in I, aVL, V5, V6 as well as V2-V4.  Lead II with T wave inversion as well.  Similar to prior EKG.  Given marked changes on EKG, I've asked Dr. Ubaldo Glassing to comment.  Appreciate his assistance. Echo pending Low suspicion for ACS in absence of symptoms, flat troponin  2. CKD IIIB - baseline creatinine ~1.9 in 06/2020 per care everywhere - follow UA - follow outpatient  - STOP lisinopril   # Thrombocytopenia: chronic per care everywhere, follow  3.  COPD without exacerbation. -We will continue his bronchodilator inhalers.  4.  Hypertension. - now hypotensive, stop  lisinopril   5.  Dyslipidemia. -We will continue statin therapy and Zetia.  6.  Depression. -We will continue Zoloft and Remeron.  7.  Prophylaxis. -Subcutaneous Lovenox.  DVT prophylaxis: lovenox Code Status: full  Family Communication: none at bedside Disposition:   Status is: Observation  The patient will require care spanning > 2 midnights and should be moved to inpatient because: Inpatient level of care appropriate due to severity of illness  Dispo: The patient is from: Home              Anticipated d/c is to: Home              Anticipated d/c date is: 1 day              Patient currently is not medically stable to d/c.       Consultants:   cardiology  Procedures:   Pending echo  Antimicrobials:  Anti-infectives (From admission, onward)   None         Subjective: No new complaints About 3 months intermittent LH Some LH with changed position Denies vertigo   Objective: Vitals:   09/30/20 0432 09/30/20 0553 09/30/20 0837 09/30/20 1541  BP: 118/76 115/70 117/72 120/74  Pulse: 64 73 62 72  Resp: 17 20 18 18   Temp: 97.7 F (36.5 C) 97.6 F (36.4 C) 97.6 F (36.4 C) 98.2 F (36.8 C)  TempSrc:   Oral Oral  SpO2: 97% 100% 96% 98%  Weight:  63.5 kg  Height:  5\' 7"  (1.702 m)      Intake/Output Summary (Last 24 hours) at 09/30/2020 1858 Last data filed at 09/30/2020 1359 Gross per 24 hour  Intake 240 ml  Output --  Net 240 ml   Filed Weights   09/29/20 1541 09/30/20 0553  Weight: 61.2 kg 63.5 kg    Examination:  General exam: Appears calm and comfortable  Respiratory system: Clear to auscultation. Respiratory effort normal. Cardiovascular system: S1 & S2 heard, RRR. No JVD, murmurs, rubs, gallops or clicks. No pedal edema. Gastrointestinal system: Abdomen is nondistended, soft and nontender.  Central nervous system: Alert and oriented. No focal neurological deficits. Extremities: no LEE Skin: No rashes, lesions or  ulcers Psychiatry: Judgement and insight appear normal. Mood & affect appropriate.     Data Reviewed: I have personally reviewed following labs and imaging studies  CBC: Recent Labs  Lab 09/29/20 1538 09/30/20 0634  WBC 7.4 5.4  HGB 14.2 13.2  HCT 42.8 40.5  MCV 91.6 91.2  PLT 70* 58*    Basic Metabolic Panel: Recent Labs  Lab 09/29/20 1538 09/30/20 0634  NA 136 138  K 5.5* 4.9  CL 100 107  CO2 26 25  GLUCOSE 102* 101*  BUN 55* 49*  CREATININE 2.08* 1.96*  CALCIUM 9.1 8.7*    GFR: Estimated Creatinine Clearance: 27.9 mL/min (Davonne Jarnigan) (by C-G formula based on SCr of 1.96 mg/dL (H)).  Liver Function Tests: Recent Labs  Lab 09/29/20 2211 09/30/20 0634  AST 20 18  ALT 19 14  ALKPHOS 67 58  BILITOT 0.8 0.7  PROT 6.8 5.9*  ALBUMIN 4.0 3.5    CBG: No results for input(s): GLUCAP in the last 168 hours.   Recent Results (from the past 240 hour(s))  Respiratory Panel by RT PCR (Flu Jonathan Kirkendoll&B, Covid) - Nasopharyngeal Swab     Status: None   Collection Time: 09/30/20 12:10 AM   Specimen: Nasopharyngeal Swab  Result Value Ref Range Status   SARS Coronavirus 2 by RT PCR NEGATIVE NEGATIVE Final    Comment: (NOTE) SARS-CoV-2 target nucleic acids are NOT DETECTED.  The SARS-CoV-2 RNA is generally detectable in upper respiratoy specimens during the acute phase of infection. The lowest concentration of SARS-CoV-2 viral copies this assay can detect is 131 copies/mL. Tarron Krolak negative result does not preclude SARS-Cov-2 infection and should not be used as the sole basis for treatment or other patient management decisions. Lorriane Dehart negative result may occur with  improper specimen collection/handling, submission of specimen other than nasopharyngeal swab, presence of viral mutation(s) within the areas targeted by this assay, and inadequate number of viral copies (<131 copies/mL). Karna Abed negative result must be combined with clinical observations, patient history, and epidemiological information.  The expected result is Negative.  Fact Sheet for Patients:  PinkCheek.be  Fact Sheet for Healthcare Providers:  GravelBags.it  This test is no t yet approved or cleared by the [place] FDA and  has been authorized for detection and/or diagnosis of SARS-CoV-2 by FDA under an Emergency Use Authorization (EUA). This EUA will remain  in effect (meaning this test can be used) for the duration of the COVID-19 declaration under Section 564(b)(1) of the Act, 21 U.S.C. section 360bbb-3(b)(1), unless the authorization is terminated or revoked sooner.     Influenza Keiko Myricks by PCR NEGATIVE NEGATIVE Final   Influenza B by PCR NEGATIVE NEGATIVE Final    Comment: (NOTE) The Xpert Xpress SARS-CoV-2/FLU/RSV assay is intended as an aid in  the diagnosis of influenza  from Nasopharyngeal swab specimens and  should not be used as Darla Mcdonald sole basis for treatment. Nasal washings and  aspirates are unacceptable for Xpert Xpress SARS-CoV-2/FLU/RSV  testing.  Fact Sheet for Patients: PinkCheek.be  Fact Sheet for Healthcare Providers: GravelBags.it  This test is not yet approved or cleared by the [place] FDA and  has been authorized for detection and/or diagnosis of SARS-CoV-2 by  FDA under an Emergency Use Authorization (EUA). This EUA will remain  in effect (meaning this test can be used) for the duration of the  Covid-19 declaration under Section 564(b)(1) of the Act, 21  U.S.C. section 360bbb-3(b)(1), unless the authorization is  terminated or revoked. Performed at [facility], [address], [place], [place] [address]          Radiology Studies: No results found.      Scheduled Meds: . atorvastatin  80 mg Oral q1800  . enoxaparin (LOVENOX) injection  30 mg Subcutaneous Q24H  . mometasone-formoterol  2 puff Inhalation BID  . sertraline  100 mg Oral Daily   . tiotropium  18 mcg Inhalation Daily  . traZODone  50 mg Oral QHS   Continuous Infusions: . sodium chloride 100 mL/hr at 09/30/20 0232     LOS: 0 days    Time spent: over 30 min    Fayrene Helper, MD [facility]   To contact the attending provider between 7A-7P or the covering provider during after hours 7P-7A, please log into the web site [email or web address] and access using universal [place] password for that web site. If you do not have the password, please call the hospital operator.  09/30/2020, 6:58 PM

## 2020-09-30 NOTE — ED Notes (Signed)
Pt assisted with standing and use of urinal. Clean catch specimen collected and sent to lab. Pt seated back into bed. Denies further needs at this time.

## 2020-09-30 NOTE — Consult Note (Signed)
Cardiology Consultation Note    Patient ID: Mario Patrick, MRN: [id number], DOB/AGE: 78-Apr-1942 78 y.o. Admit date: 09/29/2020   Date of Consult: 09/30/2020 Primary Physician: Sharyne Peach, MD Primary Cardiologist: Dr. Forestine [facility], [place]  Chief Complaint: dizzy Reason for Consultation: abnormal ekg Requesting MD: Dr. Florene Glen  HPI: Mario Patrick is a 79 y.o. male with history of cad s/p cabg in 2008 with cath at that time showing severe left main disease right coronary disease.  He had all veins from his bypass.  He had a saphenous vein graft to the LAD, saphenous vein graft to the OM saphenous vein graft to RCA.  He has a history of chronic kidney disease carotid disease who was admitted after complaining of dizziness.  He ruled out for myocardial infarction with flat minimally elevated troponins.  Creatinine was 2.08 on admission 1.86 today.  Baseline appears to be 1.7.  He has improved with fluid.  EKG showed deep T wave inversions in the anterolateral leads.  Had similar changes previously.  Denied chest pain.  No arrhythmia.  Was in sinus rhythm.  Past Medical History:  Diagnosis Date  . Back ache   . COPD (chronic obstructive pulmonary disease) ([place])   . Hypercholesteremia   . Hypertension       Surgical History:  Past Surgical History:  Procedure Laterality Date  . AORTIC VALVE REPLACEMENT    . BACK SURGERY    . CARDIAC SURGERY     CABG X4     Home Meds: Prior to Admission medications   Medication Sig Start Date End Date Taking? Authorizing Provider  acetaminophen (TYLENOL) 500 MG tablet Take 500 mg by mouth every 6 (six) hours as needed.   Yes [provider]  aspirin 81 MG EC tablet Take 81 mg by mouth daily.   Yes [provider]  atorvastatin (LIPITOR) 80 MG tablet Take 80 mg by mouth daily.   Yes [provider]  ezetimibe (ZETIA) 10 MG tablet Take 10 mg by mouth daily. 05/04/20  Yes [provider]   Fluticasone-Salmeterol (ADVAIR) 250-50 MCG/DOSE AEPB Inhale 1 puff into the lungs 2 (two) times daily.   Yes [provider]  HYDROcodone-acetaminophen (NORCO/VICODIN) 5-325 MG tablet 1-2 tabs po bid prn Patient taking differently: Take 1-2 tablets by mouth 2 (two) times daily as needed. 1-2 tabs po bid prn 04/25/20  Yes Conty, Orlando, MD  ipratropium-albuterol (DUONEB) 0.5-2.5 (3) MG/3ML SOLN Take 3 mLs by nebulization every 6 (six) hours as needed.    Yes [provider]  lisinopril (PRINIVIL,ZESTRIL) 10 MG tablet Take 10 mg by mouth daily.   Yes [provider]  meclizine (ANTIVERT) 25 MG tablet Take 25 mg by mouth 3 (three) times daily as needed for dizziness.  09/05/20  Yes [provider]  methocarbamol (ROBAXIN) 500 MG tablet Take 500 mg by mouth every 8 (eight) hours as needed for muscle spasms.  09/05/20  Yes [provider]  mirtazapine (REMERON) 7.5 MG tablet Take 7.5 mg by mouth at bedtime. 09/17/20  Yes [provider]  sertraline (ZOLOFT) 100 MG tablet Take 100 mg by mouth daily.   Yes [provider]  tiotropium (SPIRIVA) 18 MCG inhalation capsule Place 18 mcg into inhaler and inhale daily.   Yes [provider]  traZODone (DESYREL) 50 MG tablet Take 50 mg by mouth at bedtime.   Yes [provider]    Inpatient Medications:  . atorvastatin  80 mg  Oral q1800  . enoxaparin (LOVENOX) injection  30 mg Subcutaneous Q24H  . mometasone-formoterol  2 puff Inhalation BID  . sertraline  100 mg Oral Daily  . tiotropium  18 mcg Inhalation Daily  . traZODone  50 mg Oral QHS   . sodium chloride 100 mL/hr at 09/30/20 0232    Allergies:  Allergies  Allergen Reactions  . Contrast Media  [Iodinated Diagnostic Agents] Nausea Only    GI upset   . Aspirin Other (See Comments)    Thrombocytopenia (PER PT CAN TAKE ASPIRIN)    Social History   Socioeconomic History  . Marital status: Widowed    Spouse name:  Not on file  . Number of children: Not on file  . Years of education: Not on file  . Highest education level: Not on file  Occupational History  . Not on file  Tobacco Use  . Smoking status: Former Research scientist (life sciences)  . Smokeless tobacco: Never Used  Vaping Use  . Vaping Use: Never used  Substance and Sexual Activity  . Alcohol use: No  . Drug use: Never  . Sexual activity: Not on file  Other Topics Concern  . Not on file  Social History Narrative  . Not on file   Social Determinants of Health   Financial Resource Strain:   . Difficulty of Paying Living Expenses: Not on file  Food Insecurity:   . Worried About Charity fundraiser in the Last Year: Not on file  . Ran Out of Food in the Last Year: Not on file  Transportation Needs:   . Lack of Transportation (Medical): Not on file  . Lack of Transportation (Non-Medical): Not on file  Physical Activity:   . Days of Exercise per Week: Not on file  . Minutes of Exercise per Session: Not on file  Stress:   . Feeling of Stress : Not on file  Social Connections:   . Frequency of Communication with Friends and Family: Not on file  . Frequency of Social Gatherings with Friends and Family: Not on file  . Attends Religious Services: Not on file  . Active Member of Clubs or Organizations: Not on file  . Attends Archivist Meetings: Not on file  . Marital Status: Not on file  Intimate Partner Violence:   . Fear of Current or Ex-Partner: Not on file  . Emotionally Abused: Not on file  . Physically Abused: Not on file  . Sexually Abused: Not on file     Family History  Problem Relation Age of Onset  . Lung disease Mother   . Cancer Father      Review of Systems: A 12-system review of systems was performed and is negative except as noted in the HPI.  Labs: No results for input(s): CKTOTAL, CKMB, TROPONINI in the last 72 hours. Lab Results  Component Value Date   WBC 5.4 09/30/2020   HGB 13.2 09/30/2020   HCT 40.5  09/30/2020   MCV 91.2 09/30/2020   PLT 58 (L) 09/30/2020    Recent Labs  Lab 09/30/20 0634  NA 138  K 4.9  CL 107  CO2 25  BUN 49*  CREATININE 1.96*  CALCIUM 8.7*  PROT 5.9*  BILITOT 0.7  ALKPHOS 58  ALT 14  AST 18  GLUCOSE 101*   Lab Results  Component Value Date   CHOL 173 09/30/2020   HDL 37 (L) 09/30/2020   LDLCALC 123 (H) 09/30/2020   TRIG 63 09/30/2020   No  results found for: DDIMER  Radiology/Studies:  No results found.  Wt Readings from Last 3 Encounters:  09/30/20 63.5 kg  04/25/20 61.2 kg  11/11/19 66.7 kg    EKG: Sinus rhythm with deep T wave inversions in the anterolateral leads.  Physical Exam:  Blood pressure 117/72, pulse 62, temperature 97.6 F (36.4 C), temperature source Oral, resp. rate 18, height 5\' 7"  (1.702 m), weight 63.5 kg, SpO2 96 %. Body mass index is 21.93 kg/m. General: Well developed, well nourished, in no acute distress. Head: Normocephalic, atraumatic, sclera non-icteric, no xanthomas, nares are without discharge.  Neck: Negative for carotid bruits. JVD not elevated. Lungs: Clear bilaterally to auscultation without wheezes, rales, or rhonchi. Breathing is unlabored. Heart: RRR with S1 S2. No murmurs, rubs, or gallops appreciated. Abdomen: Soft, non-tender, non-distended with normoactive bowel sounds. No hepatomegaly. No rebound/guarding. No obvious abdominal masses. Msk:  Strength and tone appear normal for age. Extremities: No clubbing or cyanosis. No edema.  Distal pedal pulses are 2+ and equal bilaterally. Neuro: Alert and oriented X 3. No facial asymmetry. No focal deficit. Moves all extremities spontaneously. Psych:  Responds to questions appropriately with a normal affect.     Assessment and Plan  79 year old male status post coronary bypass grafting x3 in 2008, peripheral vascular disease admitted with dizziness and acute on chronic renal insufficiency.  Has ruled out for myocardial infarction.  Symptoms have improved  with IV fluids.  Echo is pending.  We will continue with current regimen gently hydrating, and symptoms.  Will review echo when available and if stable consider discharge with outpatient follow-up.  Signed, Teodoro Spray MD 09/30/2020, 3:35 PM Pager: [phone]

## 2020-09-30 NOTE — Progress Notes (Signed)
PHARMACIST - PHYSICIAN COMMUNICATION  CONCERNING:  Enoxaparin (Lovenox) for DVT Prophylaxis    RECOMMENDATION: Patient was prescribed enoxaprin 40mg  q24 hours for VTE prophylaxis.   Filed Weights   09/29/20 1541 09/30/20 0553  Weight: 61.2 kg (135 lb) 63.5 kg (139 lb 15.9 oz)    Body mass index is 21.93 kg/m.  Estimated Creatinine Clearance: 26.3 mL/min (A) (by C-G formula based on SCr of 2.08 mg/dL (H)).   Patient is candidate for enoxaparin 30mg  every 24 hours based on CrCl <36ml/min or Weight <45kg  DESCRIPTION: Pharmacy has adjusted enoxaparin dose per [facility] policy.  Patient is now receiving enoxaparin 30 mg every 24 hours    Ena Dawley, PharmD Clinical Pharmacist  09/30/2020 6:32 AM

## 2020-10-01 ENCOUNTER — Other Ambulatory Visit: Payer: Self-pay | Admitting: Internal Medicine

## 2020-10-01 DIAGNOSIS — I9589 Other hypotension: Secondary | ICD-10-CM | POA: Diagnosis not present

## 2020-10-01 DIAGNOSIS — E861 Hypovolemia: Secondary | ICD-10-CM

## 2020-10-01 DIAGNOSIS — R531 Weakness: Secondary | ICD-10-CM

## 2020-10-01 LAB — COMPREHENSIVE METABOLIC PANEL
ALT: 17 U/L (ref 0–44)
AST: 17 U/L (ref 15–41)
Albumin: 3.8 g/dL (ref 3.5–5.0)
Alkaline Phosphatase: 56 U/L (ref 38–126)
Anion gap: 6 (ref 5–15)
BUN: 43 mg/dL — ABNORMAL HIGH (ref 8–23)
CO2: 26 mmol/L (ref 22–32)
Calcium: 9.2 mg/dL (ref 8.9–10.3)
Chloride: 107 mmol/L (ref 98–111)
Creatinine, Ser: 1.59 mg/dL — ABNORMAL HIGH (ref 0.61–1.24)
GFR, Estimated: 44 mL/min — ABNORMAL LOW (ref >60)
Glucose, Bld: 102 mg/dL — ABNORMAL HIGH (ref 70–99)
Potassium: 4.8 mmol/L (ref 3.5–5.1)
Sodium: 139 mmol/L (ref 135–145)
Total Bilirubin: 0.9 mg/dL (ref 0.3–1.2)
Total Protein: 6.7 g/dL (ref 6.5–8.1)

## 2020-10-01 LAB — CBC WITH DIFFERENTIAL/PLATELET
Abs Immature Granulocytes: 0.02 10*3/uL (ref 0.00–0.07)
Basophils Absolute: 0 10*3/uL (ref 0.0–0.1)
Basophils Relative: 1 %
Eosinophils Absolute: 0.1 10*3/uL (ref 0.0–0.5)
Eosinophils Relative: 2 %
HCT: 43.7 % (ref 39.0–52.0)
Hemoglobin: 14.2 g/dL (ref 13.0–17.0)
Immature Granulocytes: 0 %
Lymphocytes Relative: 24 %
Lymphs Abs: 1.5 10*3/uL (ref 0.7–4.0)
MCH: 30.1 pg (ref 26.0–34.0)
MCHC: 32.5 g/dL (ref 30.0–36.0)
MCV: 92.8 fL (ref 80.0–100.0)
Monocytes Absolute: 0.5 10*3/uL (ref 0.1–1.0)
Monocytes Relative: 8 %
Neutro Abs: 4 10*3/uL (ref 1.7–7.7)
Neutrophils Relative %: 65 %
Platelets: 64 10*3/uL — ABNORMAL LOW (ref 150–400)
RBC: 4.71 MIL/uL (ref 4.22–5.81)
RDW: 13.2 % (ref 11.5–15.5)
WBC: 6.2 10*3/uL (ref 4.0–10.5)
nRBC: 0 % (ref 0.0–0.2)

## 2020-10-01 LAB — PROTIME-INR
INR: 1.2 (ref 0.8–1.2)
Prothrombin Time: 14.4 seconds (ref 11.4–15.2)

## 2020-10-01 LAB — ECHOCARDIOGRAM COMPLETE
Area-P 1/2: 3.99 cm2
Height: 67 in
S' Lateral: 3.06 cm
Weight: 2239.87 oz

## 2020-10-01 LAB — MAGNESIUM: Magnesium: 1.8 mg/dL (ref 1.7–2.4)

## 2020-10-01 LAB — PHOSPHORUS: Phosphorus: 3.7 mg/dL (ref 2.5–4.6)

## 2020-10-01 MED ORDER — MIRTAZAPINE 7.5 MG PO TABS
7.5000 mg | ORAL_TABLET | Freq: Every day | ORAL | 0 refills | Status: DC
Start: 2020-10-01 — End: 2021-07-16

## 2020-10-01 MED ORDER — METHOCARBAMOL 500 MG PO TABS: 500.0000 mg | ORAL_TABLET | Freq: Three times a day (TID) | ORAL | 0 refills | Status: DC | PRN

## 2020-10-01 MED ORDER — SERTRALINE HCL 100 MG PO TABS: 100.0000 mg | ORAL_TABLET | Freq: Every day | ORAL | 0 refills | Status: DC

## 2020-10-01 MED ORDER — TRAZODONE HCL 50 MG PO TABS
50.0000 mg | ORAL_TABLET | Freq: Every day | ORAL | 0 refills | Status: DC
Start: 2020-10-01 — End: 2020-11-03

## 2020-10-01 MED ORDER — IPRATROPIUM-ALBUTEROL 0.5-2.5 (3) MG/3ML IN SOLN: 3.0000 mL | Freq: Four times a day (QID) | RESPIRATORY_TRACT | 0 refills | Status: DC | PRN

## 2020-10-01 MED ORDER — TIOTROPIUM BROMIDE MONOHYDRATE 18 MCG IN CAPS
18.0000 ug | ORAL_CAPSULE | Freq: Every day | RESPIRATORY_TRACT | 0 refills | Status: DC
Start: 2020-10-01 — End: 2020-10-28

## 2020-10-01 MED ORDER — MECLIZINE HCL 25 MG PO TABS: 25.0000 mg | ORAL_TABLET | Freq: Three times a day (TID) | ORAL | 0 refills | Status: DC | PRN

## 2020-10-01 MED ORDER — ASPIRIN 81 MG PO TBEC
81.0000 mg | DELAYED_RELEASE_TABLET | Freq: Every day | ORAL | 1 refills | Status: DC
Start: 2020-10-01 — End: 2021-02-14

## 2020-10-01 MED ORDER — FLUTICASONE-SALMETEROL 250-50 MCG/DOSE IN AEPB: 1.0000 | INHALATION_SPRAY | Freq: Two times a day (BID) | RESPIRATORY_TRACT | 0 refills | Status: AC

## 2020-10-01 MED ORDER — ATORVASTATIN CALCIUM 80 MG PO TABS
80.0000 mg | ORAL_TABLET | Freq: Every day | ORAL | 1 refills | Status: DC
Start: 2020-10-01 — End: 2023-06-28

## 2020-10-01 MED ORDER — EZETIMIBE 10 MG PO TABS: 10.0000 mg | ORAL_TABLET | Freq: Every day | ORAL | 1 refills | Status: AC

## 2020-10-01 NOTE — Progress Notes (Signed)
Patient Name: Mario Patrick Date of Encounter: 10/01/2020  Hospital Problem List     Active Problems:   Hypotension   Orthostatic hypotension    Patient Profile      79 y.o. male with history of cad s/p cabg in 2008 with cath at that time showing severe left main disease right coronary disease.  He had all veins from his bypass.  He had a saphenous vein graft to the LAD, saphenous vein graft to the OM saphenous vein graft to RCA.  He has a history of chronic kidney disease carotid disease who was admitted after complaining of dizziness.  He ruled out for myocardial infarction with flat minimally elevated troponins.  Creatinine was 2.08 on admission 1.86 today.  Baseline appears to be 1.7.  He has improved with fluid.  EKG showed deep T wave inversions in the anterolateral leads.  Had similar changes previously.  Denied chest pain.  No arrhythmia.  Was in sinus rhythm.  Subjective   Feels well with no dizziness or other symptoms.  Inpatient Medications    . atorvastatin  80 mg Oral q1800  . enoxaparin (LOVENOX) injection  30 mg Subcutaneous Q24H  . mometasone-formoterol  2 puff Inhalation BID  . sertraline  100 mg Oral Daily  . tiotropium  18 mcg Inhalation Daily  . traZODone  50 mg Oral QHS    Vital Signs    Vitals:   09/30/20 1541 09/30/20 2157 10/01/20 0037 10/01/20 0826  BP: 120/74 109/62 128/78 (!) 122/97  Pulse: 72 66 70 68  Resp: 18 14 14 19   Temp: 98.2 F (36.8 C) 98.5 F (36.9 C) 98.2 F (36.8 C) 98 F (36.7 C)  TempSrc: Oral Oral Oral Oral  SpO2: 98% 97% 98% 96%  Weight:      Height:        Intake/Output Summary (Last 24 hours) at 10/01/2020 1004 Last data filed at 09/30/2020 1359 Gross per 24 hour  Intake 240 ml  Output --  Net 240 ml   Filed Weights   09/29/20 1541 09/30/20 0553  Weight: 61.2 kg 63.5 kg    Physical Exam    GEN: Well nourished, well developed, in no acute distress.  HEENT: normal.  Neck: Supple, no JVD, carotid bruits,  or masses. Cardiac: RRR, no murmurs, rubs, or gallops. No clubbing, cyanosis, edema.  Radials/DP/PT 2+ and equal bilaterally.  Respiratory:  Respirations regular and unlabored, clear to auscultation bilaterally. GI: Soft, nontender, nondistended, BS + x 4. MS: no deformity or atrophy. Skin: warm and dry, no rash. Neuro:  Strength and sensation are intact. Psych: Normal affect.  Labs    CBC Recent Labs    09/30/20 0634 10/01/20 0407  WBC 5.4 6.2  NEUTROABS  --  4.0  HGB 13.2 14.2  HCT 40.5 43.7  MCV 91.2 92.8  PLT 58* 64*   Basic Metabolic Panel Recent Labs    09/30/20 0634 10/01/20 0407  NA 138 139  K 4.9 4.8  CL 107 107  CO2 25 26  GLUCOSE 101* 102*  BUN 49* 43*  CREATININE 1.96* 1.59*  CALCIUM 8.7* 9.2  MG  --  1.8  PHOS  --  3.7   Liver Function Tests Recent Labs    09/30/20 0634 10/01/20 0407  AST 18 17  ALT 14 17  ALKPHOS 58 56  BILITOT 0.7 0.9  PROT 5.9* 6.7  ALBUMIN 3.5 3.8   No results for input(s): LIPASE, AMYLASE in the last 72 hours.  Cardiac Enzymes No results for input(s): CKTOTAL, CKMB, CKMBINDEX, TROPONINI in the last 72 hours. BNP No results for input(s): BNP in the last 72 hours. D-Dimer No results for input(s): DDIMER in the last 72 hours. Hemoglobin A1C No results for input(s): HGBA1C in the last 72 hours. Fasting Lipid Panel Recent Labs    09/30/20 1340  CHOL 173  HDL 37*  LDLCALC 123*  TRIG 63  CHOLHDL 4.7   Thyroid Function Tests No results for input(s): TSH, T4TOTAL, T3FREE, THYROIDAB in the last 72 hours.  Invalid input(s): FREET3  Telemetry    Normal sinus rhythm  ECG    Normal sinus rhythm with LVH/T wave inversions inferolateral leads.  Radiology    ECHOCARDIOGRAM COMPLETE  Result Date: 10/01/2020    ECHOCARDIOGRAM REPORT   Patient Name:   Mario Patrick Date of Exam: 09/30/2020 Medical Rec #:  [id number]           Height:       67.0 in Accession #:    [id number]          Weight:       140.0 lb Date  of Birth:  [birth year]          BSA:          1.738 m Patient Age:    56 years            BP:           120/74 mmHg Patient Gender: M                   HR:           80 bpm. Exam Location:  [facility] Procedure: 2D Echo, Cardiac Doppler and Color Doppler Indications:     Orthostatis hypotension  History:         Patient has no prior history of Echocardiogram examinations.                  Risk Factors:Hypertension and Dyslipidemia. COPD.  Sonographer:     Wilford Sports Rodgers-Jones Referring Phys:  [id number] Alben Deeds JR Diagnosing Phys: Bartholome Bill MD  Sonographer Comments: Suboptimal parasternal window. IMPRESSIONS  1. Left ventricular ejection fraction, by estimation, is 55 to 60%. The left ventricle has normal function. The left ventricle has no regional wall motion abnormalities. There is moderate left ventricular hypertrophy. Left ventricular diastolic parameters are consistent with Grade I diastolic dysfunction (impaired relaxation).  2. Right ventricular systolic function is normal. The right ventricular size is normal.  3. Right atrial size was mildly dilated.  4. The mitral valve is grossly normal. Trivial mitral valve regurgitation.  5. The aortic valve is calcified. Aortic valve regurgitation is not visualized. Mild aortic valve sclerosis is present, with no evidence of aortic valve stenosis. FINDINGS  Left Ventricle: Left ventricular ejection fraction, by estimation, is 55 to 60%. The left ventricle has normal function. The left ventricle has no regional wall motion abnormalities. The left ventricular internal cavity size was normal in size. There is  moderate left ventricular hypertrophy. Left ventricular diastolic parameters are consistent with Grade I diastolic dysfunction (impaired relaxation). Right Ventricle: The right ventricular size is normal. No increase in right ventricular wall thickness. Right ventricular systolic function is normal. Left Atrium: Left atrial size was normal in size. Right  Atrium: Right atrial size was mildly dilated. Pericardium: There is no evidence of pericardial effusion. Mitral Valve: The mitral valve is grossly normal. Trivial mitral valve regurgitation. Tricuspid Valve:  The tricuspid valve is grossly normal. Tricuspid valve regurgitation is trivial. Aortic Valve: The aortic valve is calcified. Aortic valve regurgitation is not visualized. Mild aortic valve sclerosis is present, with no evidence of aortic valve stenosis. Pulmonic Valve: The pulmonic valve was not well visualized. Pulmonic valve regurgitation is trivial. Aorta: The aortic root is normal in size and structure. IAS/Shunts: The interatrial septum was not assessed.  LEFT VENTRICLE PLAX 2D LVIDd:         4.22 cm  Diastology LVIDs:         3.06 cm  LV e' medial:    6.09 cm/s LV PW:         1.06 cm  LV E/e' medial:  13.0 LV IVS:        1.06 cm  LV e' lateral:   5.77 cm/s LVOT diam:     1.70 cm  LV E/e' lateral: 13.7 LV SV:         47 LV SV Index:   27 LVOT Area:     2.27 cm  RIGHT VENTRICLE             IVC RV Basal diam:  3.77 cm     IVC diam: 1.44 cm RV S prime:     14.20 cm/s TAPSE (M-mode): 2.3 cm LEFT ATRIUM             Index       RIGHT ATRIUM           Index LA diam:        3.70 cm 2.13 cm/m  RA Area:     11.00 cm LA Vol (A2C):   41.6 ml 23.94 ml/m RA Volume:   27.80 ml  16.00 ml/m LA Vol (A4C):   45.7 ml 26.30 ml/m LA Biplane Vol: 44.5 ml 25.61 ml/m  AORTIC VALVE LVOT Vmax:   106.50 cm/s LVOT Vmean:  74.050 cm/s LVOT VTI:    0.208 m  AORTA Ao Root diam: 3.20 cm MITRAL VALVE MV Area (PHT): 3.99 cm    SHUNTS MV Decel Time: 190 msec    Systemic VTI:  0.21 m MV E velocity: 79.20 cm/s  Systemic Diam: 1.70 cm MV A velocity: 96.10 cm/s MV E/A ratio:  0.82 Bartholome Bill MD Electronically signed by Bartholome Bill MD Signature Date/Time: 10/01/2020/7:27:24 AM    Final     Assessment & Plan    79 year old male status post coronary bypass grafting x3 in 2008, peripheral vascular disease admitted with dizziness and  acute on chronic renal insufficiency.  Has ruled out for myocardial infarction.  Symptoms have improved with IV fluids.  Echo revealed normal LV function with no regional wall wall motion abnormality.  Mild to moderate LVH with some mild diastolic dysfunction.  EKG changes did not appear to suggest any acute ischemic change.  Patient is asymptomatic.  Would discharge to home with outpatient follow-up with his primary cardiologist or in our office if the patient desires.  Patient's primary cardiologist is Dr. Delton Coombes at [facility] in [place]. Signed, Javier Docker Rogelio Waynick MD 10/01/2020, 10:04 AM  Pager: [phone]

## 2020-10-01 NOTE — TOC Initial Note (Addendum)
Transition of Care [facility]) - Initial/Assessment Note    Patient Details  Name: Mario Patrick MRN: [id number] Date of Birth: [birth year]  [facility] CM/SW Contact:    Magnus Ivan, LCSW Phone Number: 10/01/2020, 10:47 AM  Clinical Narrative:             CSW met with patient at bedside. Patient reported he lives alone in [place] and drives himself to appointments. PCP is Dr. Iona Beard. No DME, HH, or SNF history. Pharmacy is [facility] in [place]. CSW discussed PT recommendation for HHPT. Patient is agreeable to this recommendation. Explained agency options, patient denied having an agency preference. Referral to [facility] who accepted patient for HHPT.  Expected Discharge Plan: [place] Barriers to Discharge: Continued Medical Work up   Patient Goals and CMS Choice Patient states their goals for this hospitalization and ongoing recovery are:: home with home health PT CMS Medicare.gov Compare Post Acute Care list provided to:: Patient Choice offered to / list presented to : Patient  Expected Discharge Plan and Services Expected Discharge Plan: [place]       Living arrangements for the past 2 months: Single Family Home                                      Prior Living Arrangements/Services Living arrangements for the past 2 months: Single Family Home Lives with:: Self Patient language and need for interpreter reviewed:: Yes Do you feel safe going back to the place where you live?: Yes      Need for Family Participation in Patient Care: Yes (Comment) Care giver support system in place?: Yes (comment)   Criminal Activity/Legal Involvement Pertinent to Current Situation/Hospitalization: No - Comment as needed  Activities of Daily Living Home Assistive Devices/Equipment: None ADL Screening (condition at time of admission) Patient's cognitive ability adequate to safely complete daily activities?: Yes Is the  patient deaf or have difficulty hearing?: No Does the patient have difficulty seeing, even when wearing glasses/contacts?: No Does the patient have difficulty concentrating, remembering, or making decisions?: No Patient able to express need for assistance with ADLs?: Yes Does the patient have difficulty dressing or bathing?: No Independently performs ADLs?: Yes (appropriate for developmental age) Does the patient have difficulty walking or climbing stairs?: No Weakness of Legs: None Weakness of Arms/Hands: None  Permission Sought/Granted Permission sought to share information with : Chartered certified accountant granted to share information with : Yes, Verbal Permission Granted     Permission granted to share info w AGENCY: HH agencies        Emotional Assessment Appearance:: Appears stated age     Orientation: : Oriented to Situation, Oriented to  Time, Oriented to Place, Oriented to Self Alcohol / Substance Use: Not Applicable Psych Involvement: No (comment)  Admission diagnosis:  Dizziness [R42] Hypotension [I95.9] Hypotension, unspecified hypotension type [I95.9] Orthostatic hypotension [I95.1] Patient Active Problem List   Diagnosis Date Noted  . Hypotension 09/30/2020  . Orthostatic hypotension 09/30/2020   PCP:  Sharyne Peach, MD Pharmacy:   [facility] [place], [facility] [address] [facility] [place] [address] Phone: [phone] Fax: [phone]     Social Determinants of Health (SDOH) Interventions    Readmission Risk Interventions No flowsheet data found.

## 2020-10-01 NOTE — Progress Notes (Signed)
Per Dr Doristine Bosworth ok to d/c telemetry monitor

## 2020-10-01 NOTE — Progress Notes (Signed)
Pt left facility in stable condition. Pt verbalized understanding of all discharge instructions including follow up with cardiology.

## 2020-10-01 NOTE — Discharge Summary (Signed)
Physician Discharge Summary  Mario Patrick EYC:[id number] DOB: 1941/06/28 DOA: 09/29/2020  PCP: Sharyne Peach, MD  Admit date: 09/29/2020 Discharge date: 10/01/2020  Admitted From: home Disposition:  Home with [facility]  Recommendations for Outpatient Follow-up:  Follow-up with PCP in 1 week Follow-up with cardiology in 1 week Hold lisinopril until follow-up visit with cardiology/PCP Repeat CMP on follow-up visit.  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  Upon presentation to the emergency room his vital signs were normal. His blood pressures dropped to 96/59 and reportedly his systolic was in the 14H per the ER physician which improved with IV fluids. Stool Hemoccult came back Negative. He was admitted for further workup of his hypotension.  Orthostatic Hypotension  - BP improved with IVF -Lisinopril was held given hypotension. -Transthoracic echo shows ejection fraction of 55 to 60% with grade 1 diastolic dysfunction.  Mild to moderate LVH. -EKG changes did not appear to suggest any acute ischemic changes.  -Cardiology consulted-MI ruled out.  Recommended outpatient follow-up in 1 week. -Patient was evaluated by PT recommended home health PT  Abnormal EKG  Elevated Troponin  Hx CAD s/p CABG: EKG with T wave inversions in I, aVL, V5, V6 as well as V2-V4.  Lead II with T wave inversion as well.  Similar to prior EKG. reviewed echo. -Patient remained asymptomatic during hospitalization.  Patient evaluated by cardiology recommended outpatient follow-up.   AKI on CKD IIIB -Improved with IV fluids.  -Creatinine:  1.59, GFR: 44  (creatinine: 2.08 and GFR: 32 upon arrival) -Hold lisinopril until follow-up with PCP/cardiology appointment.  Thrombocytopenia: chronic per care everywhere. -Platelet: 64 on the day of discharge.  No signs of active bleeding.  COPD without exacerbation. -Remained on room air.  Continued home inhalers  Hypertension. -Hypotensive upon arrival. -Held lisinopril  Dyslipidemia. -Continued home meds stay statin and Zetia  Depression. -Continued home meds Zoloft and Remeron.  Discharge Diagnoses:  Orthostatic hypotension History of coronary artery disease status post CABG Abnormal EKG/elevated troponin AKI on CKD stage IIIb Thrombocytopenia COPD without exacerbation Hypertension Dyslipidemia Depression  Discharge Instructions  Discharge Instructions    Diet - low sodium heart healthy   Complete by: As directed    Discharge instructions   Complete by: As directed    Follow-up with PCP in 1 week Follow-up with cardiology in 1 week Repeat CMP on follow-up visit Hold lisinopril until follow-up with cardiology/PCP.   Increase activity slowly   Complete by: As directed      Allergies as of 10/01/2020      Reactions   Contrast Media  [iodinated Diagnostic Agents] Nausea Only   GI upset   Aspirin Other (See Comments)   Thrombocytopenia (PER PT CAN TAKE ASPIRIN)      Medication List    STOP taking these medications   lisinopril 10 MG tablet Commonly known as: ZESTRIL     TAKE these medications   acetaminophen 500 MG tablet Commonly known as: TYLENOL Take 500 mg by mouth every 6 (six) hours as needed.   aspirin 81 MG EC tablet Take 1 tablet (81 mg total) by mouth daily.   atorvastatin 80 MG tablet Commonly known as: LIPITOR Take 1 tablet (80 mg total) by mouth daily.  ezetimibe 10 MG tablet Commonly known as: ZETIA Take 1 tablet (10 mg total) by mouth daily.   Fluticasone-Salmeterol 250-50 MCG/DOSE Aepb Commonly known as: ADVAIR Inhale  1 puff into the lungs 2 (two) times daily. What changed: when to take this   HYDROcodone-acetaminophen 5-325 MG tablet Commonly known as: NORCO/VICODIN 1-2 tabs po bid prn What changed:   how much to take  how to take this  when to take this  reasons to take this   ipratropium-albuterol 0.5-2.5 (3) MG/3ML Soln Commonly known as: DUONEB Take 3 mLs by nebulization every 6 (six) hours as needed.   meclizine 25 MG tablet Commonly known as: ANTIVERT Take 1 tablet (25 mg total) by mouth 3 (three) times daily as needed for dizziness.   methocarbamol 500 MG tablet Commonly known as: ROBAXIN Take 1 tablet (500 mg total) by mouth every 8 (eight) hours as needed for muscle spasms.   mirtazapine 7.5 MG tablet Commonly known as: REMERON Take 1 tablet (7.5 mg total) by mouth at bedtime.   sertraline 100 MG tablet Commonly known as: ZOLOFT Take 1 tablet (100 mg total) by mouth daily.   tiotropium 18 MCG inhalation capsule Commonly known as: SPIRIVA Place 1 capsule (18 mcg total) into inhaler and inhale daily. What changed: when to take this   traZODone 50 MG tablet Commonly known as: DESYREL Take 1 tablet (50 mg total) by mouth at bedtime.       Follow-up Information    Teodoro Spray, MD Follow up in 1 week(s).   Specialty: Cardiology Contact information: [address] [place] [address] [phone]              Allergies  Allergen Reactions  . Contrast Media  [Iodinated Diagnostic Agents] Nausea Only    GI upset   . Aspirin Other (See Comments)    Thrombocytopenia (PER PT CAN TAKE ASPIRIN)    Consultations:  Cardiology   Procedures/Studies: ECHOCARDIOGRAM COMPLETE  Result Date: 10/01/2020    ECHOCARDIOGRAM REPORT   Patient Name:   Mario Patrick Date of Exam: 09/30/2020 Medical Rec #:  [id number]           Height:       67.0 in Accession #:    [id number]          Weight:       140.0 lb Date of Birth:  1941/05/07          BSA:           1.738 m Patient Age:    30 years            BP:           120/74 mmHg Patient Gender: M                   HR:           80 bpm. Exam Location:  [facility] Procedure: 2D Echo, Cardiac Doppler and Color Doppler Indications:     Orthostatis hypotension  History:         Patient has no prior history of Echocardiogram examinations.                  Risk Factors:Hypertension and Dyslipidemia. COPD.  Sonographer:     Wilford Sports Rodgers-Jones Referring Phys:  [id number] Alben Deeds JR Diagnosing Phys: Bartholome Bill MD  Sonographer Comments: Suboptimal parasternal window. IMPRESSIONS  1. Left ventricular ejection fraction, by estimation, is 55 to 60%. The left  ventricle has normal function. The left ventricle has no regional wall motion abnormalities. There is moderate left ventricular hypertrophy. Left ventricular diastolic parameters are consistent with Grade I diastolic dysfunction (impaired relaxation).  2. Right ventricular systolic function is normal. The right ventricular size is normal.  3. Right atrial size was mildly dilated.  4. The mitral valve is grossly normal. Trivial mitral valve regurgitation.  5. The aortic valve is calcified. Aortic valve regurgitation is not visualized. Mild aortic valve sclerosis is present, with no evidence of aortic valve stenosis. FINDINGS  Left Ventricle: Left ventricular ejection fraction, by estimation, is 55 to 60%. The left ventricle has normal function. The left ventricle has no regional wall motion abnormalities. The left ventricular internal cavity size was normal in size. There is  moderate left ventricular hypertrophy. Left ventricular diastolic parameters are consistent with Grade I diastolic dysfunction (impaired relaxation). Right Ventricle: The right ventricular size is normal. No increase in right ventricular wall thickness. Right ventricular systolic function is normal. Left Atrium: Left atrial size was normal in size. Right Atrium: Right atrial size was mildly dilated.  Pericardium: There is no evidence of pericardial effusion. Mitral Valve: The mitral valve is grossly normal. Trivial mitral valve regurgitation. Tricuspid Valve: The tricuspid valve is grossly normal. Tricuspid valve regurgitation is trivial. Aortic Valve: The aortic valve is calcified. Aortic valve regurgitation is not visualized. Mild aortic valve sclerosis is present, with no evidence of aortic valve stenosis. Pulmonic Valve: The pulmonic valve was not well visualized. Pulmonic valve regurgitation is trivial. Aorta: The aortic root is normal in size and structure. IAS/Shunts: The interatrial septum was not assessed.  LEFT VENTRICLE PLAX 2D LVIDd:         4.22 cm  Diastology LVIDs:         3.06 cm  LV e' medial:    6.09 cm/s LV PW:         1.06 cm  LV E/e' medial:  13.0 LV IVS:        1.06 cm  LV e' lateral:   5.77 cm/s LVOT diam:     1.70 cm  LV E/e' lateral: 13.7 LV SV:         47 LV SV Index:   27 LVOT Area:     2.27 cm  RIGHT VENTRICLE             IVC RV Basal diam:  3.77 cm     IVC diam: 1.44 cm RV S prime:     14.20 cm/s TAPSE (M-mode): 2.3 cm LEFT ATRIUM             Index       RIGHT ATRIUM           Index LA diam:        3.70 cm 2.13 cm/m  RA Area:     11.00 cm LA Vol (A2C):   41.6 ml 23.94 ml/m RA Volume:   27.80 ml  16.00 ml/m LA Vol (A4C):   45.7 ml 26.30 ml/m LA Biplane Vol: 44.5 ml 25.61 ml/m  AORTIC VALVE LVOT Vmax:   106.50 cm/s LVOT Vmean:  74.050 cm/s LVOT VTI:    0.208 m  AORTA Ao Root diam: 3.20 cm MITRAL VALVE MV Area (PHT): 3.99 cm    SHUNTS MV Decel Time: 190 msec    Systemic VTI:  0.21 m MV E velocity: 79.20 cm/s  Systemic Diam: 1.70 cm MV A velocity: 96.10 cm/s MV E/A ratio:  0.82 Bartholome Bill  MD Electronically signed by Bartholome Bill MD Signature Date/Time: 10/01/2020/7:27:24 AM    Final       Subjective: Patient seen and examined.  Sitting comfortably on the bed.  Tells me that he feels much better and denies any  complaints including headache, blurry vision, dizziness,  lightheadedness, chest pain, shortness of breath, palpitation, nausea, vomiting or diarrhea.  Wishes to go home today.  Discharge Exam: Vitals:   10/01/20 0037 10/01/20 0826  BP: 128/78 (!) 122/97  Pulse: 70 68  Resp: 14 19  Temp: 98.2 F (36.8 C) 98 F (36.7 C)  SpO2: 98% 96%   Vitals:   09/30/20 1541 09/30/20 2157 10/01/20 0037 10/01/20 0826  BP: 120/74 109/62 128/78 (!) 122/97  Pulse: 72 66 70 68  Resp: 18 14 14 19   Temp: 98.2 F (36.8 C) 98.5 F (36.9 C) 98.2 F (36.8 C) 98 F (36.7 C)  TempSrc: Oral Oral Oral Oral  SpO2: 98% 97% 98% 96%  Weight:      Height:        General: Pt is alert, awake, not in acute distress Cardiovascular: RRR, S1/S2 +, no rubs, no gallops Respiratory: CTA bilaterally, no wheezing, no rhonchi Abdominal: Soft, NT, ND, bowel sounds + Extremities: no edema, no cyanosis    The results of significant diagnostics from this hospitalization (including imaging, microbiology, ancillary and laboratory) are listed below for reference.     Microbiology: Recent Results (from the past 240 hour(s))  Respiratory Panel by RT PCR (Flu A&B, Covid) - Nasopharyngeal Swab     Status: None   Collection Time: 09/30/20 12:10 AM   Specimen: Nasopharyngeal Swab  Result Value Ref Range Status   SARS Coronavirus 2 by RT PCR NEGATIVE NEGATIVE Final    Comment: (NOTE) SARS-CoV-2 target nucleic acids are NOT DETECTED.  The SARS-CoV-2 RNA is generally detectable in upper respiratoy specimens during the acute phase of infection. The lowest concentration of SARS-CoV-2 viral copies this assay can detect is 131 copies/mL. A negative result does not preclude SARS-Cov-2 infection and should not be used as the sole basis for treatment or other patient management decisions. A negative result may occur with  improper specimen collection/handling, submission of specimen other than nasopharyngeal swab, presence of viral mutation(s) within the areas targeted by this assay,  and inadequate number of viral copies (<131 copies/mL). A negative result must be combined with clinical observations, patient history, and epidemiological information. The expected result is Negative.  Fact Sheet for Patients:  PinkCheek.be  Fact Sheet for Healthcare Providers:  GravelBags.it  This test is no t yet approved or cleared by the [place] FDA and  has been authorized for detection and/or diagnosis of SARS-CoV-2 by FDA under an Emergency Use Authorization (EUA). This EUA will remain  in effect (meaning this test can be used) for the duration of the COVID-19 declaration under Section 564(b)(1) of the Act, 21 U.S.C. section 360bbb-3(b)(1), unless the authorization is terminated or revoked sooner.     Influenza A by PCR NEGATIVE NEGATIVE Final   Influenza B by PCR NEGATIVE NEGATIVE Final    Comment: (NOTE) The Xpert Xpress SARS-CoV-2/FLU/RSV assay is intended as an aid in  the diagnosis of influenza from Nasopharyngeal swab specimens and  should not be used as a sole basis for treatment. Nasal washings and  aspirates are unacceptable for Xpert Xpress SARS-CoV-2/FLU/RSV  testing.  Fact Sheet for Patients: PinkCheek.be  Fact Sheet for Healthcare Providers: GravelBags.it  This test is not yet approved or  cleared by the [place] and  has been authorized for detection and/or diagnosis of SARS-CoV-2 by  FDA under an Emergency Use Authorization (EUA). This EUA will remain  in effect (meaning this test can be used) for the duration of the  Covid-19 declaration under Section 564(b)(1) of the Act, 21  U.S.C. section 360bbb-3(b)(1), unless the authorization is  terminated or revoked. Performed at [facility], [address] [place], [place] [address]      Labs: BNP (last 3 results) No results for input(s): BNP in the last 8760  hours. Basic Metabolic Panel: Recent Labs  Lab 09/29/20 1538 09/30/20 0634 10/01/20 0407  NA 136 138 139  K 5.5* 4.9 4.8  CL 100 107 107  CO2 26 25 26   GLUCOSE 102* 101* 102*  BUN 55* 49* 43*  CREATININE 2.08* 1.96* 1.59*  CALCIUM 9.1 8.7* 9.2  MG  --   --  1.8  PHOS  --   --  3.7   Liver Function Tests: Recent Labs  Lab 09/29/20 2211 09/30/20 0634 10/01/20 0407  AST 20 18 17   ALT 19 14 17   ALKPHOS 67 58 56  BILITOT 0.8 0.7 0.9  PROT 6.8 5.9* 6.7  ALBUMIN 4.0 3.5 3.8   No results for input(s): LIPASE, AMYLASE in the last 168 hours. No results for input(s): AMMONIA in the last 168 hours. CBC: Recent Labs  Lab 09/29/20 1538 09/30/20 0634 10/01/20 0407  WBC 7.4 5.4 6.2  NEUTROABS  --   --  4.0  HGB 14.2 13.2 14.2  HCT 42.8 40.5 43.7  MCV 91.6 91.2 92.8  PLT 70* 58* 64*   Cardiac Enzymes: No results for input(s): CKTOTAL, CKMB, CKMBINDEX, TROPONINI in the last 168 hours. BNP: Invalid input(s): POCBNP CBG: No results for input(s): GLUCAP in the last 168 hours. D-Dimer No results for input(s): DDIMER in the last 72 hours. Hgb A1c No results for input(s): HGBA1C in the last 72 hours. Lipid Profile Recent Labs    09/30/20 1340  CHOL 173  HDL 37*  LDLCALC 123*  TRIG 63  CHOLHDL 4.7   Thyroid function studies No results for input(s): TSH, T4TOTAL, T3FREE, THYROIDAB in the last 72 hours.  Invalid input(s): FREET3 Anemia work up No results for input(s): VITAMINB12, FOLATE, FERRITIN, TIBC, IRON, RETICCTPCT in the last 72 hours. Urinalysis    Component Value Date/Time   COLORURINE YELLOW (A) 09/29/2020 0001   APPEARANCEUR CLEAR (A) 09/29/2020 0001   LABSPEC 1.014 09/29/2020 0001   PHURINE 5.0 09/29/2020 0001   GLUCOSEU NEGATIVE 09/29/2020 0001   HGBUR NEGATIVE 09/29/2020 0001   BILIRUBINUR NEGATIVE 09/29/2020 0001   KETONESUR NEGATIVE 09/29/2020 0001   PROTEINUR NEGATIVE 09/29/2020 0001   NITRITE NEGATIVE 09/29/2020 0001   LEUKOCYTESUR NEGATIVE  09/29/2020 0001   Sepsis Labs Invalid input(s): PROCALCITONIN,  WBC,  LACTICIDVEN Microbiology Recent Results (from the past 240 hour(s))  Respiratory Panel by RT PCR (Flu A&B, Covid) - Nasopharyngeal Swab     Status: None   Collection Time: 09/30/20 12:10 AM   Specimen: Nasopharyngeal Swab  Result Value Ref Range Status   SARS Coronavirus 2 by RT PCR NEGATIVE NEGATIVE Final    Comment: (NOTE) SARS-CoV-2 target nucleic acids are NOT DETECTED.  The SARS-CoV-2 RNA is generally detectable in upper respiratoy specimens during the acute phase of infection. The lowest concentration of SARS-CoV-2 viral copies this assay can detect is 131 copies/mL. A negative result does not preclude SARS-Cov-2 infection and should not be used  as the sole basis for treatment or other patient management decisions. A negative result may occur with  improper specimen collection/handling, submission of specimen other than nasopharyngeal swab, presence of viral mutation(s) within the areas targeted by this assay, and inadequate number of viral copies (<131 copies/mL). A negative result must be combined with clinical observations, patient history, and epidemiological information. The expected result is Negative.  Fact Sheet for Patients:  PinkCheek.be  Fact Sheet for Healthcare Providers:  GravelBags.it  This test is no t yet approved or cleared by the [place] FDA and  has been authorized for detection and/or diagnosis of SARS-CoV-2 by FDA under an Emergency Use Authorization (EUA). This EUA will remain  in effect (meaning this test can be used) for the duration of the COVID-19 declaration under Section 564(b)(1) of the Act, 21 U.S.C. section 360bbb-3(b)(1), unless the authorization is terminated or revoked sooner.     Influenza A by PCR NEGATIVE NEGATIVE Final   Influenza B by PCR NEGATIVE NEGATIVE Final    Comment: (NOTE) The Xpert  Xpress SARS-CoV-2/FLU/RSV assay is intended as an aid in  the diagnosis of influenza from Nasopharyngeal swab specimens and  should not be used as a sole basis for treatment. Nasal washings and  aspirates are unacceptable for Xpert Xpress SARS-CoV-2/FLU/RSV  testing.  Fact Sheet for Patients: PinkCheek.be  Fact Sheet for Healthcare Providers: GravelBags.it  This test is not yet approved or cleared by the [place] FDA and  has been authorized for detection and/or diagnosis of SARS-CoV-2 by  FDA under an Emergency Use Authorization (EUA). This EUA will remain  in effect (meaning this test can be used) for the duration of the  Covid-19 declaration under Section 564(b)(1) of the Act, 21  U.S.C. section 360bbb-3(b)(1), unless the authorization is  terminated or revoked. Performed at [facility], [address] [place], [place] [address]      Time coordinating discharge: Over 30 minutes  SIGNED:   Mckinley Jewel, MD  [facility] 10/01/2020, 11:43 AM Pager   If 7PM-7AM, please contact night-coverage [email or web address]

## 2020-10-28 ENCOUNTER — Other Ambulatory Visit: Payer: Self-pay | Admitting: Internal Medicine

## 2020-11-01 ENCOUNTER — Other Ambulatory Visit: Payer: Self-pay | Admitting: Internal Medicine

## 2020-11-21 ENCOUNTER — Ambulatory Visit
Admission: EM | Admit: 2020-11-21 | Discharge: 2020-11-21 | Disposition: A | Discharge status: Home / Self Care | Payer: Medicare HMO | Attending: Family Medicine | Admitting: Family Medicine

## 2020-11-21 ENCOUNTER — Other Ambulatory Visit: Payer: Self-pay

## 2020-11-21 DIAGNOSIS — U071 COVID-19: Secondary | ICD-10-CM | POA: Insufficient documentation

## 2020-11-21 DIAGNOSIS — Z20822 Contact with and (suspected) exposure to covid-19: Secondary | ICD-10-CM

## 2020-11-21 NOTE — Discharge Instructions (Signed)

## 2020-11-21 NOTE — ED Triage Notes (Signed)
Patient states that he was exposed to some friends last week who tested positive for covid and would like to be tested. Patient currently with no symptoms.

## 2020-11-23 LAB — SARS CORONAVIRUS 2 (TAT 6-24 HRS): SARS Coronavirus 2: POSITIVE — AB

## 2020-12-03 ENCOUNTER — Other Ambulatory Visit: Payer: Self-pay | Admitting: Family Medicine

## 2020-12-09 ENCOUNTER — Other Ambulatory Visit: Payer: Self-pay | Admitting: Family Medicine

## 2021-01-06 ENCOUNTER — Other Ambulatory Visit: Payer: Self-pay | Admitting: Family Medicine

## 2021-02-05 ENCOUNTER — Other Ambulatory Visit: Payer: Self-pay | Admitting: Family Medicine

## 2021-02-05 ENCOUNTER — Other Ambulatory Visit: Payer: Self-pay | Admitting: Internal Medicine

## 2021-02-14 ENCOUNTER — Ambulatory Visit
Admission: EM | Admit: 2021-02-14 | Discharge: 2021-02-14 | Disposition: A | Discharge status: Home / Self Care | Payer: TRICARE For Life (TFL)

## 2021-02-14 ENCOUNTER — Ambulatory Visit (INDEPENDENT_AMBULATORY_CARE_PROVIDER_SITE_OTHER): Payer: TRICARE For Life (TFL)

## 2021-02-14 ENCOUNTER — Other Ambulatory Visit: Payer: Self-pay

## 2021-02-14 DIAGNOSIS — M5432 Sciatica, left side: Secondary | ICD-10-CM

## 2021-02-14 MED ORDER — PREDNISONE 10 MG (21) PO TBPK: ORAL_TABLET | ORAL | 0 refills | Status: DC

## 2021-02-14 MED ORDER — HYDROCODONE-ACETAMINOPHEN 5-325 MG PO TABS: 1.0000 | ORAL_TABLET | Freq: Four times a day (QID) | ORAL | 0 refills | Status: DC | PRN

## 2021-02-14 NOTE — ED Provider Notes (Signed)
[facility]    CSN: [id number] Arrival date & time: 02/14/21  1152      History   Chief Complaint Chief Complaint  Patient presents with  . Hip Pain    left    HPI Damain Vasa Stene is a 80 y.o. male.   HPI   80 year old male here for evaluation of left hip pain.  Patient reports that he has had a gradual increase in pain in his left hip over the last 2 weeks.  Patient reports that the pain goes down the front and outside of his left leg.  Patient also has low back pain, which is not new, that radiates into his buttock.  Patient states that his hip hurts to touch and it also hurts for him to bear weight.  Patient denies any numbness or tingling in his left leg.  He is not aware of any injuries and has not had any falls.  Patient denies any swelling or bruising.  Does receive lumbar injections for chronic low back pain and bilateral sciatica is a history of lumbar stenosis.  Patient's last epidural injection occurred on 01/06/2021.  Past Medical History:  Diagnosis Date  . Back ache   . COPD (chronic obstructive pulmonary disease) ([place])   . Hypercholesteremia   . Hypertension     Patient Active Problem List   Diagnosis Date Noted  . Hypotension 09/30/2020  . Orthostatic hypotension 09/30/2020    Past Surgical History:  Procedure Laterality Date  . AORTIC VALVE REPLACEMENT    . BACK SURGERY    . CARDIAC SURGERY     CABG X4       Home Medications    Prior to Admission medications   Medication Sig Start Date End Date Taking? Authorizing Provider  acetaminophen (TYLENOL) 500 MG tablet Take 500 mg by mouth every 6 (six) hours as needed.   Yes [provider]  albuterol (PROVENTIL) (2.5 MG/3ML) 0.083% nebulizer solution  06/04/20  Yes [provider]  amLODipine (NORVASC) 5 MG tablet  10/08/20  Yes [provider]  atorvastatin (LIPITOR) 80 MG tablet Take 1 tablet (80 mg total) by mouth daily. 10/01/20  Yes Pahwani, Rinka R, MD   Fluticasone-Salmeterol (ADVAIR) 250-50 MCG/DOSE AEPB Inhale 1 puff into the lungs 2 (two) times daily. 10/01/20  Yes Pahwani, Michell Heinrich, MD  HYDROcodone-acetaminophen (NORCO) 5-325 MG tablet Take 1 tablet by mouth every 6 (six) hours as needed for moderate pain. 02/14/21  Yes Margarette Canada, NP  ipratropium-albuterol (DUONEB) 0.5-2.5 (3) MG/3ML SOLN Take 3 mLs by nebulization every 6 (six) hours as needed. 10/01/20  Yes Pahwani, Michell Heinrich, MD  ketoconazole (NIZORAL) 2 % cream  12/24/19  Yes [provider]  lisinopril (ZESTRIL) 10 MG tablet Take 10 mg by mouth daily. 12/28/20  Yes [provider]  meclizine (ANTIVERT) 25 MG tablet Take 1 tablet (25 mg total) by mouth 3 (three) times daily as needed for dizziness. 10/01/20  Yes Pahwani, Rinka R, MD  methocarbamol (ROBAXIN) 500 MG tablet Take 1 tablet (500 mg total) by mouth every 8 (eight) hours as needed for muscle spasms. 10/01/20  Yes Pahwani, Rinka R, MD  mirtazapine (REMERON) 7.5 MG tablet Take 1 tablet (7.5 mg total) by mouth at bedtime. 10/01/20  Yes Pahwani, Rinka R, MD  predniSONE (STERAPRED UNI-PAK 21 TAB) 10 MG (21) TBPK tablet Take 6 tablets on day 1, 5 tablets day 2, 4 tablets day 3, 3 tablets day 4, 2 tablets day 5, 1 tablet  day 6 02/14/21  Yes Margarette Canada, NP  sertraline (ZOLOFT) 100 MG tablet Take 1 tablet (100 mg total) by mouth daily. 10/01/20  Yes Pahwani, Michell Heinrich, MD  SPIRIVA HANDIHALER 18 MCG inhalation capsule INHALE THE CONTENTS OF 1 CAPSULE VIA INHALATION DEVICE EVERY DAY 12/03/20  Yes Fayrene Helper, MD  traZODone (DESYREL) 50 MG tablet TAKE 1 TABLET(50 MG) BY MOUTH AT BEDTIME 01/06/21  Yes Fayrene Helper, MD  ezetimibe (ZETIA) 10 MG tablet Take 1 tablet (10 mg total) by mouth daily. 10/01/20   Pahwani, Michell Heinrich, MD    Family History Family History  Problem Relation Age of Onset  . Lung disease Mother   . Cancer Father     Social History Social History   Tobacco Use  . Smoking status: Former Research scientist (life sciences)   . Smokeless tobacco: Never Used  Vaping Use  . Vaping Use: Never used  Substance Use Topics  . Alcohol use: No  . Drug use: Never     Allergies   Contrast media  [iodinated diagnostic agents] and Aspirin   Review of Systems Review of Systems  Constitutional: Negative for activity change, appetite change and fever.  Musculoskeletal: Positive for arthralgias, back pain and myalgias.  Skin: Negative for color change.  Neurological: Negative for weakness and numbness.  Hematological: Negative.   Psychiatric/Behavioral: Negative.      Physical Exam Triage Vital Signs ED Triage Vitals  Enc Vitals Group     BP 02/14/21 1205 (!) 151/109     Pulse Rate 02/14/21 1205 65     Resp 02/14/21 1205 18     Temp 02/14/21 1205 97.9 F (36.6 C)     Temp Source 02/14/21 1205 Oral     SpO2 02/14/21 1205 99 %     Weight 02/14/21 1202 135 lb (61.2 kg)     Height 02/14/21 1202 '5\' 7"'$  (1.702 m)     Head Circumference --      Peak Flow --      Pain Score 02/14/21 1202 9     Pain Loc --      Pain Edu? --      Excl. in [place]? --    No data found.  Updated Vital Signs BP (!) 151/109 (BP Location: Right Arm)   Pulse 65   Temp 97.9 F (36.6 C) (Oral)   Resp 18   Ht '5\' 7"'$  (1.702 m)   Wt 135 lb (61.2 kg)   SpO2 99%   BMI 21.14 kg/m   Visual Acuity Right Eye Distance:   Left Eye Distance:   Bilateral Distance:    Right Eye Near:   Left Eye Near:    Bilateral Near:     Physical Exam Vitals and nursing note reviewed.  Constitutional:      General: He is not in acute distress.    Appearance: Normal appearance. He is not ill-appearing.  HENT:     Head: Normocephalic and atraumatic.  Cardiovascular:     Rate and Rhythm: Normal rate and regular rhythm.     Pulses: Normal pulses.     Heart sounds: Normal heart sounds.  Pulmonary:     Effort: Pulmonary effort is normal.     Breath sounds: Normal breath sounds. No wheezing, rhonchi or rales.  Musculoskeletal:        General:  Tenderness present. No swelling or signs of injury.  Skin:    General: Skin is warm and dry.     Capillary Refill: Capillary refill  takes less than 2 seconds.     Findings: No bruising or erythema.  Neurological:     General: No focal deficit present.     Mental Status: He is alert and oriented to person, place, and time.  Psychiatric:        Mood and Affect: Mood normal.        Behavior: Behavior normal.        Thought Content: Thought content normal.        Judgment: Judgment normal.      UC Treatments / Results  Labs (all labs ordered are listed, but only abnormal results are displayed) Labs Reviewed - No data to display  EKG   Radiology DG Lumbar Spine Complete  Result Date: 02/14/2021 CLINICAL DATA:  Left hip pain EXAM: LUMBAR SPINE - COMPLETE 4+ VIEW COMPARISON:  12/27/2017 MRI FINDINGS: Mild diffuse osseous demineralization. Five lumbar type vertebral segments. Vertebral body heights and alignment are maintained. No fracture identified. Mild disc height loss of L5-S1. The remaining intervertebral disc spaces are relatively preserved. Minimal degenerative endplate changes. Mild lower lumbar facet arthrosis. Vascular calcifications are present. IMPRESSION: Mild degenerative changes of the lumbar spine. No acute findings. Electronically Signed   By: Davina Poke D.O.   On: 02/14/2021 13:14   DG Hip Unilat W or Wo Pelvis 2-3 Views Left  Result Date: 02/14/2021 CLINICAL DATA:  Left hip pain.  No recent fall or injury EXAM: DG HIP (WITH OR WITHOUT PELVIS) 2-3V LEFT COMPARISON:  None. FINDINGS: There is no evidence of hip fracture or dislocation. Mild joint space narrowing of the bilateral hips. Otherwise, no focal bone abnormality. Vascular calcifications are present. IMPRESSION: No acute osseous abnormality. Electronically Signed   By: Davina Poke D.O.   On: 02/14/2021 13:12    Procedures Procedures (including critical care time)  Medications Ordered in  UC Medications - No data to display  Initial Impression / Assessment and Plan / UC Course  I have reviewed the triage vital signs and the nursing notes.  Pertinent labs & imaging results that were available during my care of the patient were reviewed by me and considered in my medical decision making (see chart for details).   80 year old male here for evaluation of left hip pain that has been gradually increasing over the last 2 weeks.  Patient does have a history of chronic low back pain with bilateral sciatica for which he receives periodic epidural steroid injections.  His last injection was in February of this year.  Patient works that the pain he is having now he thinks is sciatica because he is having low back pain then he has pain into his buttock but he also has pain in the hip and it hurts to bear weight.  Physical exam reveals a frail, in pain 80 year old male who has tenderness to palpation of his lumbar spine and the left paraspinous region as well as into the superior aspect of the left gluteus maximus.  Patient has tenderness with palpation of the greater trochanter of the hip and also has pain with passive range of motion of the hip.  Patient does have a positive straight leg raise but patient also has pain with internal/external rotation of the hip.  Patient's left lower extremity strength is 3/5 and right is 5/5.  Patient has no observable foot drop with ambulation and he denies saddle anesthesia.  Will obtain lumbar spine films as well as radiograph of left hip to rule out fracture.  Left hip and  AP pelvis images evaluated by me.  Interpretation: There is no evidence of fracture or dislocation of the hip or pelvis.  There is atherosclerotic changes bilaterally.  Awaiting radiology overread.  Lumbar spine films evaluated by me.  Interpretation: No evidence of fracture or dislocation.  Patient has normal lumbar lordosis.  Awaiting radiology overread.  Urology read of lumbar spine films  and hip films are negative for fracture.  We will discharge patient home with diagnosis of low back pain and sciatica.  With a steroid pack and have him follow-up with physical medicine rehab for any continued issues.   Final Clinical Impressions(s) / UC Diagnoses   Final diagnoses:  Sciatica of left side     Discharge Instructions     Take the prednisone as directed starting tomorrow morning.  Use the Norco as needed for severe pain  Follow-up with your back specialist for any continued symptoms.    ED Prescriptions    Medication Sig Dispense Auth. Provider   predniSONE (STERAPRED UNI-PAK 21 TAB) 10 MG (21) TBPK tablet Take 6 tablets on day 1, 5 tablets day 2, 4 tablets day 3, 3 tablets day 4, 2 tablets day 5, 1 tablet day 6 21 tablet Margarette Canada, NP   HYDROcodone-acetaminophen (NORCO) 5-325 MG tablet Take 1 tablet by mouth every 6 (six) hours as needed for moderate pain. 10 tablet Margarette Canada, NP     I have reviewed the PDMP during this encounter.   Margarette Canada, NP 02/14/21 1328

## 2021-02-14 NOTE — Discharge Instructions (Addendum)
Take the prednisone as directed starting tomorrow morning.  Use the Norco as needed for severe pain  Follow-up with your back specialist for any continued symptoms.

## 2021-02-14 NOTE — ED Triage Notes (Addendum)
Pt c/o pain in left hip that radiates down his leg, increasing over 2 weeks. Pt denies any fall/injury. Pt does have increased pain with walking. Pt denies any numbness/tingling to the leg or toes.

## 2021-03-04 ENCOUNTER — Other Ambulatory Visit: Payer: Self-pay | Admitting: Surgery

## 2021-03-04 DIAGNOSIS — S29001A Unspecified injury of muscle and tendon of front wall of thorax, initial encounter: Secondary | ICD-10-CM

## 2021-03-05 ENCOUNTER — Ambulatory Visit
Admission: RE | Admit: 2021-03-05 | Discharge: 2021-03-05 | Disposition: A | Discharge status: Home / Self Care | Payer: Medicare HMO | Source: Ambulatory Visit | Attending: Surgery | Admitting: Surgery

## 2021-03-05 ENCOUNTER — Other Ambulatory Visit: Payer: Self-pay

## 2021-03-05 DIAGNOSIS — S29001A Unspecified injury of muscle and tendon of front wall of thorax, initial encounter: Secondary | ICD-10-CM | POA: Insufficient documentation

## 2021-03-06 ENCOUNTER — Other Ambulatory Visit: Payer: Self-pay

## 2021-03-06 ENCOUNTER — Ambulatory Visit
Admission: EM | Admit: 2021-03-06 | Discharge: 2021-03-06 | Disposition: A | Discharge status: Home / Self Care | Payer: TRICARE For Life (TFL) | Attending: Sports Medicine | Admitting: Sports Medicine

## 2021-03-06 ENCOUNTER — Encounter: Payer: Self-pay | Admitting: Emergency Medicine

## 2021-03-06 DIAGNOSIS — J069 Acute upper respiratory infection, unspecified: Secondary | ICD-10-CM | POA: Diagnosis not present

## 2021-03-06 DIAGNOSIS — R059 Cough, unspecified: Secondary | ICD-10-CM

## 2021-03-06 DIAGNOSIS — Z8709 Personal history of other diseases of the respiratory system: Secondary | ICD-10-CM

## 2021-03-06 DIAGNOSIS — R0602 Shortness of breath: Secondary | ICD-10-CM

## 2021-03-06 MED ORDER — ALBUTEROL SULFATE HFA 108 (90 BASE) MCG/ACT IN AERS: 1.0000 | INHALATION_SPRAY | Freq: Four times a day (QID) | RESPIRATORY_TRACT | 0 refills | Status: AC | PRN

## 2021-03-06 NOTE — ED Triage Notes (Signed)
Patient c/o SOB that started 3 days ago. Reports a cough that started 1 week ago with increasing worsening SOB. He normally takes Albuterol inhaler but has run out of this medication.

## 2021-03-06 NOTE — Discharge Instructions (Addendum)
Please see educational handout

## 2021-03-07 NOTE — ED Provider Notes (Signed)
[facility]    CSN: [id number] Arrival date & time: 03/06/21  1509      History   Chief Complaint Chief Complaint  Patient presents with  . Cough  . Shortness of Breath    HPI Mario Patrick is a 80 y.o. male.   Patient is a pleasant 80 year old male who presents with his daughter-in-law for evaluation of the above issues.  Of note, the patient is hard of hearing, and at times is mildly confused and wonder whether or not he has some mild baseline dementia.  50% of the history was obtained from the daughter-in-law.  He has had a cough now for about a week.  Not really productive.  He has noted some shortness of breath and some chest tightness for about 3 days now.  He takes Advair and albuterol as he has an underlying diagnosis of COPD.  Not currently smoking.  He is run out of his albuterol so his daughter-in-law brought him in to see if that can get refilled.  He normally sees [facility] in [place] but could not get into see anyone today.  No fever shakes chills.  Complicating his situation again is that COPD.  He denies any significant chest pain or shortness of breath.  No diaphoresis, jaw pain, arm pain, numbness or tingling, or any symptoms of an acute coronary event or vascular event.  No red flag signs or symptoms elicited on history.     Past Medical History:  Diagnosis Date  . Back ache   . COPD (chronic obstructive pulmonary disease) ([place])   . Hypercholesteremia   . Hypertension     Patient Active Problem List   Diagnosis Date Noted  . Hypotension 09/30/2020  . Orthostatic hypotension 09/30/2020    Past Surgical History:  Procedure Laterality Date  . AORTIC VALVE REPLACEMENT    . BACK SURGERY    . CARDIAC SURGERY     CABG X4       Home Medications    Prior to Admission medications   Medication Sig Start Date End Date Taking? Authorizing Provider  acetaminophen (TYLENOL) 500 MG tablet Take 500 mg by mouth every 6 (six) hours as  needed.   Yes [provider]  albuterol (VENTOLIN HFA) 108 (90 Base) MCG/ACT inhaler Inhale 1-2 puffs into the lungs every 6 (six) hours as needed for wheezing or shortness of breath. 03/06/21  Yes Verda Cumins, MD  amLODipine (NORVASC) 5 MG tablet  10/08/20  Yes [provider]  atorvastatin (LIPITOR) 80 MG tablet Take 1 tablet (80 mg total) by mouth daily. 10/01/20  Yes Pahwani, Rinka R, MD  ezetimibe (ZETIA) 10 MG tablet Take 1 tablet (10 mg total) by mouth daily. 10/01/20  Yes Pahwani, Rinka R, MD  Fluticasone-Salmeterol (ADVAIR) 250-50 MCG/DOSE AEPB Inhale 1 puff into the lungs 2 (two) times daily. 10/01/20  Yes Pahwani, Rinka R, MD  lisinopril (ZESTRIL) 10 MG tablet Take 10 mg by mouth daily. 12/28/20  Yes [provider]  meclizine (ANTIVERT) 25 MG tablet Take 1 tablet (25 mg total) by mouth 3 (three) times daily as needed for dizziness. 10/01/20  Yes Pahwani, Rinka R, MD  mirtazapine (REMERON) 7.5 MG tablet Take 1 tablet (7.5 mg total) by mouth at bedtime. 10/01/20  Yes Pahwani, Rinka R, MD  sertraline (ZOLOFT) 100 MG tablet Take 1 tablet (100 mg total) by mouth daily. 10/01/20  Yes Pahwani, Michell Heinrich, MD  SPIRIVA HANDIHALER 18 MCG inhalation capsule INHALE THE CONTENTS  OF 1 CAPSULE VIA INHALATION DEVICE EVERY DAY 12/03/20  Yes Fayrene Helper, MD  traZODone (DESYREL) 50 MG tablet TAKE 1 TABLET(50 MG) BY MOUTH AT BEDTIME 01/06/21  Yes Fayrene Helper, MD  HYDROcodone-acetaminophen (NORCO) 5-325 MG tablet Take 1 tablet by mouth every 6 (six) hours as needed for moderate pain. 02/14/21   Margarette Canada, NP  ipratropium-albuterol (DUONEB) 0.5-2.5 (3) MG/3ML SOLN Take 3 mLs by nebulization every 6 (six) hours as needed. 10/01/20   Pahwani, Michell Heinrich, MD  ketoconazole (NIZORAL) 2 % cream  12/24/19   [provider]  methocarbamol (ROBAXIN) 500 MG tablet Take 1 tablet (500 mg total) by mouth every 8 (eight) hours as needed for muscle spasms. 10/01/20   Pahwani,  Michell Heinrich, MD  predniSONE (STERAPRED UNI-PAK 21 TAB) 10 MG (21) TBPK tablet Take 6 tablets on day 1, 5 tablets day 2, 4 tablets day 3, 3 tablets day 4, 2 tablets day 5, 1 tablet day 6 02/14/21   Margarette Canada, NP    Family History Family History  Problem Relation Age of Onset  . Lung disease Mother   . Cancer Father     Social History Social History   Tobacco Use  . Smoking status: Former Research scientist (life sciences)  . Smokeless tobacco: Never Used  Vaping Use  . Vaping Use: Never used  Substance Use Topics  . Alcohol use: No  . Drug use: Never     Allergies   Contrast media  [iodinated diagnostic agents] and Aspirin   Review of Systems Review of Systems  Constitutional: Negative for activity change, appetite change, chills, diaphoresis and fever.  HENT: Positive for congestion. Negative for ear pain, postnasal drip, rhinorrhea, sinus pressure, sinus pain and sneezing.   Eyes: Negative.  Negative for pain.  Respiratory: Positive for cough, chest tightness and shortness of breath.   Cardiovascular: Negative for chest pain, palpitations and leg swelling.  Gastrointestinal: Negative.  Negative for abdominal pain, constipation, diarrhea, nausea and vomiting.  Genitourinary: Negative.  Negative for dysuria.  Musculoskeletal: Negative.  Negative for arthralgias, back pain, myalgias, neck pain and neck stiffness.  Skin: Negative.  Negative for color change, pallor, rash and wound.  Neurological: Negative.  Negative for dizziness, light-headedness and headaches.  All other systems reviewed and are negative.    Physical Exam Triage Vital Signs ED Triage Vitals  Enc Vitals Group     BP 03/06/21 1523 (!) 143/90     Pulse Rate 03/06/21 1523 91     Resp 03/06/21 1523 (!) 24     Temp 03/06/21 1523 98.7 F (37.1 C)     Temp Source 03/06/21 1523 Oral     SpO2 03/06/21 1523 96 %     Weight 03/06/21 1523 134 lb 14.7 oz (61.2 kg)     Height 03/06/21 1523 '5\' 7"'$  (1.702 m)     Head Circumference --       Peak Flow --      Pain Score 03/06/21 1522 0     Pain Loc --      Pain Edu? --      Excl. in [place]? --    No data found.  Updated Vital Signs BP (!) 143/90 (BP Location: Right Arm)   Pulse 91   Temp 98.7 F (37.1 C) (Oral)   Resp (!) 24   Ht '5\' 7"'$  (1.702 m)   Wt 61.2 kg   SpO2 96%   BMI 21.13 kg/m   Visual Acuity Right Eye Distance:  Left Eye Distance:   Bilateral Distance:    Right Eye Near:   Left Eye Near:    Bilateral Near:     Physical Exam Vitals and nursing note reviewed.  Constitutional:      General: He is not in acute distress.    Appearance: He is well-developed. He is not ill-appearing, toxic-appearing or diaphoretic.  HENT:     Head: Normocephalic and atraumatic.     Ears:     Comments: Hard of hearing    Mouth/Throat:     Mouth: Mucous membranes are moist.     Pharynx: Oropharynx is clear. No pharyngeal swelling or oropharyngeal exudate.  Eyes:     Conjunctiva/sclera: Conjunctivae normal.     Pupils: Pupils are equal, round, and reactive to light.     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  Neck:     Vascular: No hepatojugular reflux or JVD.     Trachea: No tracheal deviation.  Cardiovascular:     Rate and Rhythm: Normal rate and regular rhythm.  No extrasystoles are present.    Pulses: No decreased pulses.     Heart sounds: Normal heart sounds. No murmur heard. No friction rub. No gallop.   Pulmonary:     Effort: Pulmonary effort is normal.     Breath sounds: No decreased breath sounds, wheezing, rhonchi or rales.     Comments: Breathing 18 a minute on my exam Musculoskeletal:     Cervical back: Normal range of motion and neck supple.  Skin:    General: Skin is warm and dry.     Capillary Refill: Capillary refill takes less than 2 seconds.     Findings: No ecchymosis, erythema or rash.  Neurological:     General: No focal deficit present.     Mental Status: He is alert.  Psychiatric:        Behavior: Behavior is  cooperative.     Comments: Mildly confused at times - ? Mild dementia      UC Treatments / Results  Labs (all labs ordered are listed, but only abnormal results are displayed) Labs Reviewed - No data to display  EKG   Radiology No results found.  Procedures Procedures (including critical care time)  Medications Ordered in UC Medications - No data to display  Initial Impression / Assessment and Plan / UC Course  I have reviewed the triage vital signs and the nursing notes.  Pertinent labs & imaging results that were available during my care of the patient were reviewed by me and considered in my medical decision making (see chart for details).  Clinical impression: 1 week of cough and 3 days of shortness of breath in a 80 year old male with underlying COPD.  He has run out of his albuterol.  He does take Advair as well.  Could not get into his primary care physician today for refill.  Treatment plan: 1.  The findings and treatment plan were discussed in detail with the patient and his daughter-in-law.  All parties were in agreement and all questions were encouraged and answered.  They both voiced verbal understanding. 2.  I did renew the albuterol.  I discussed the fact that the Advair was a maintenance medication with a long-acting beta-2 agonist.  And that the albuterol is a rescue inhaler.  They would only use it if he was symptomatic. 3.  I do not believe he is having a COPD exacerbation.  His lungs are clear,  no wheeze, no fever, and he is breathing 18 a minute which is certainly within normal limits. 4.  Educational handouts provided. 5.  Given his cardiac and vascular history I have advised the daughter-in-law to have a low threshold that if his symptoms were to worsen in any way or he developed any chest pain, shortness of breath, or any cardiac or vascular issues that they were concerned about they should call 911 and be evaluated in the ER.  They did voiced verbal  understanding. 6.  Tylenol or Motrin for any fever or discomfort. 7.  I have asked that he follow-up with his primary care provider next week for recheck.  Again if worse he should go to the ER. 8.  Was stable on discharge from the urgent care and he will follow-up with [place] as needed.  Greater than 30 minutes was spent with the patient obtaining a history, doing a physical exam, discussing the details of my findings with both him and his daughter-in-law.  Given his mild confusion and hearing issues this encounter did take a little longer than I anticipated.  In addition all questions were encouraged and answered.    Final Clinical Impressions(s) / UC Diagnoses   Final diagnoses:  Shortness of breath  Cough  Viral upper respiratory tract infection  History of COPD     Discharge Instructions     Please see educational handout    ED Prescriptions    Medication Sig Dispense Auth. Provider   albuterol (VENTOLIN HFA) 108 (90 Base) MCG/ACT inhaler Inhale 1-2 puffs into the lungs every 6 (six) hours as needed for wheezing or shortness of breath. 1 each Verda Cumins, MD     PDMP not reviewed this encounter.   Verda Cumins, MD 03/07/21 2145

## 2021-06-18 ENCOUNTER — Other Ambulatory Visit: Payer: Self-pay | Admitting: Physical Medicine & Rehabilitation

## 2021-06-18 DIAGNOSIS — M48062 Spinal stenosis, lumbar region with neurogenic claudication: Secondary | ICD-10-CM

## 2021-06-18 DIAGNOSIS — G8929 Other chronic pain: Secondary | ICD-10-CM

## 2021-06-23 ENCOUNTER — Other Ambulatory Visit: Payer: Self-pay

## 2021-06-23 ENCOUNTER — Ambulatory Visit
Admission: RE | Admit: 2021-06-23 | Discharge: 2021-06-23 | Disposition: A | Discharge status: Home / Self Care | Payer: Medicare HMO | Source: Ambulatory Visit | Attending: Physical Medicine & Rehabilitation | Admitting: Physical Medicine & Rehabilitation

## 2021-06-23 DIAGNOSIS — M48062 Spinal stenosis, lumbar region with neurogenic claudication: Secondary | ICD-10-CM | POA: Insufficient documentation

## 2021-06-23 DIAGNOSIS — G8929 Other chronic pain: Secondary | ICD-10-CM | POA: Insufficient documentation

## 2021-06-23 DIAGNOSIS — M5441 Lumbago with sciatica, right side: Secondary | ICD-10-CM | POA: Insufficient documentation

## 2021-06-23 DIAGNOSIS — M5442 Lumbago with sciatica, left side: Secondary | ICD-10-CM | POA: Insufficient documentation

## 2021-07-13 ENCOUNTER — Other Ambulatory Visit: Payer: Self-pay | Admitting: Orthopedic Surgery

## 2021-07-15 ENCOUNTER — Other Ambulatory Visit
Admission: RE | Admit: 2021-07-15 | Discharge: 2021-07-15 | Disposition: A | Discharge status: Home / Self Care | Payer: TRICARE For Life (TFL) | Source: Ambulatory Visit | Attending: Orthopedic Surgery | Admitting: Orthopedic Surgery

## 2021-07-15 ENCOUNTER — Other Ambulatory Visit: Payer: Self-pay

## 2021-07-15 HISTORY — DX: Dyspnea, unspecified: R06.00

## 2021-07-15 HISTORY — DX: Chronic kidney disease, unspecified: N18.9

## 2021-07-15 HISTORY — DX: Depression, unspecified: F32.A

## 2021-07-15 HISTORY — DX: Heart failure, unspecified: I50.9

## 2021-07-15 HISTORY — DX: Angina pectoris, unspecified: I20.9

## 2021-07-15 HISTORY — DX: Acute myocardial infarction, unspecified: I21.9

## 2021-07-15 HISTORY — DX: Atherosclerotic heart disease of native coronary artery without angina pectoris: I25.10

## 2021-07-15 HISTORY — DX: Unspecified osteoarthritis, unspecified site: M19.90

## 2021-07-15 NOTE — Patient Instructions (Addendum)
Your procedure is scheduled on:07/16/21  Report to the Registration Desk on the 1st floor of the [facility]. To find out your arrival time, please call [phone] between 1PM - 3PM on: 07/15/21  REMEMBER: Instructions that are not followed completely may result in serious medical risk, up to and including death; or upon the discretion of your surgeon and anesthesiologist your surgery may need to be rescheduled.  Do not eat food after midnight the night before surgery.  No gum chewing, lozengers or hard candies.  You may however, drink CLEAR liquids up to 2 hours before you are scheduled to arrive for your surgery. Do not drink anything within 2 hours of your scheduled arrival time.  Clear liquids include: - water  - apple juice without pulp - gatorade (not RED, PURPLE, OR BLUE) - black coffee or tea (Do NOT add milk or creamers to the coffee or tea) Do NOT drink anything that is not on this list.   TAKE THESE MEDICATIONS THE MORNING OF SURGERY WITH A SIP OF WATER:  - ezetimibe (ZETIA) 10 MG tablet - Fluticasone-Salmeterol (ADVAIR) 250-50 MCG/DOSE AEPB - sertraline (ZOLOFT) 100 MG tablet - SPIRIVA HANDIHALER 18 MCG inhalation capsule - amLODipine (NORVASC) 5 MG tablet  One week prior to surgery: Stop Anti-inflammatories (NSAIDS) such as Advil, Aleve, Ibuprofen, Motrin, Naproxen, Naprosyn and Aspirin based products such as Excedrin, Goodys Powder, BC Powder.  Stop ANY OVER THE COUNTER supplements until after surgery.  You may however, continue to take Tylenol if needed for pain up until the day of surgery.  No Alcohol for 24 hours before or after surgery.  No Smoking including e-cigarettes for 24 hours prior to surgery.  No chewable tobacco products for at least 6 hours prior to surgery.  No nicotine patches on the day of surgery.  Do not use any "recreational" drugs for at least a week prior to your surgery.  Please be advised that the combination of cocaine and  anesthesia may have negative outcomes, up to and including death. If you test positive for cocaine, your surgery will be cancelled.  On the morning of surgery brush your teeth with toothpaste and water, you may rinse your mouth with mouthwash if you wish. Do not swallow any toothpaste or mouthwash.  Do not wear jewelry, make-up, hairpins, clips or nail polish.  Do not wear lotions, powders, or perfumes.   Do not shave body from the neck down 48 hours prior to surgery just in case you cut yourself which could leave a site for infection.  Also, freshly shaved skin may become irritated if using the CHG soap.  Contact lenses, hearing aids and dentures may not be worn into surgery.  Do not bring valuables to the hospital. [facility] is not responsible for any missing/lost belongings or valuables.   Use CHG Soap or wipes as directed on instruction sheet.  Notify your doctor if there is any change in your medical condition (cold, fever, infection).  Wear comfortable clothing (specific to your surgery type) to the hospital.  After surgery, you can help prevent lung complications by doing breathing exercises.  Take deep breaths and cough every 1-2 hours. Your doctor may order a device called an Incentive Spirometer to help you take deep breaths. When coughing or sneezing, hold a pillow firmly against your incision with both hands. This is called "splinting." Doing this helps protect your incision. It also decreases belly discomfort.  If you are being admitted to the hospital overnight, leave your  suitcase in the car. After surgery it may be brought to your room.  If you are being discharged the day of surgery, you will not be allowed to drive home. You will need a responsible adult (18 years or older) to drive you home and stay with you that night.   If you are taking public transportation, you will need to have a responsible adult (18 years or older) with you. Please confirm with your  physician that it is acceptable to use public transportation.   Please call the [facility] Dept. at [phone] if you have any questions about these instructions.  Surgery Visitation Policy:  Patients undergoing a surgery or procedure may have one family member or support person with them as long as that person is not COVID-19 positive or experiencing its symptoms.  That person may remain in the waiting area during the procedure.  Inpatient Visitation:    Visiting hours are 7 a.m. to 8 p.m. Inpatients will be allowed two visitors daily. The visitors may change each day during the patient's stay. No visitors under the age of 58. Any visitor under the age of 15 must be accompanied by an adult. The visitor must pass COVID-19 screenings, use hand sanitizer when entering and exiting the patient's room and wear a mask at all times, including in the patient's room. Patients must also wear a mask when staff or their visitor are in the room. Masking is required regardless of vaccination status.

## 2021-07-16 ENCOUNTER — Encounter: Payer: Self-pay | Admitting: Orthopedic Surgery

## 2021-07-16 ENCOUNTER — Ambulatory Visit: Payer: Medicare HMO | Admitting: Anesthesiology

## 2021-07-16 ENCOUNTER — Other Ambulatory Visit: Payer: Self-pay

## 2021-07-16 ENCOUNTER — Encounter
Admission: RE | Disposition: A | Discharge status: Home / Self Care | Payer: Self-pay | Source: Home / Self Care | Attending: Orthopedic Surgery

## 2021-07-16 ENCOUNTER — Ambulatory Visit
Admission: RE | Admit: 2021-07-16 | Discharge: 2021-07-16 | Disposition: A | Discharge status: Home / Self Care | Payer: Medicare HMO | Attending: Orthopedic Surgery | Admitting: Orthopedic Surgery

## 2021-07-16 ENCOUNTER — Ambulatory Visit: Payer: Medicare HMO

## 2021-07-16 DIAGNOSIS — Z7951 Long term (current) use of inhaled steroids: Secondary | ICD-10-CM | POA: Diagnosis not present

## 2021-07-16 DIAGNOSIS — Z8249 Family history of ischemic heart disease and other diseases of the circulatory system: Secondary | ICD-10-CM | POA: Insufficient documentation

## 2021-07-16 DIAGNOSIS — Z809 Family history of malignant neoplasm, unspecified: Secondary | ICD-10-CM | POA: Insufficient documentation

## 2021-07-16 DIAGNOSIS — S32010A Wedge compression fracture of first lumbar vertebra, initial encounter for closed fracture: Secondary | ICD-10-CM | POA: Diagnosis not present

## 2021-07-16 DIAGNOSIS — Z87891 Personal history of nicotine dependence: Secondary | ICD-10-CM | POA: Diagnosis not present

## 2021-07-16 DIAGNOSIS — Z801 Family history of malignant neoplasm of trachea, bronchus and lung: Secondary | ICD-10-CM | POA: Diagnosis not present

## 2021-07-16 DIAGNOSIS — X58XXXA Exposure to other specified factors, initial encounter: Secondary | ICD-10-CM | POA: Diagnosis not present

## 2021-07-16 DIAGNOSIS — I130 Hypertensive heart and chronic kidney disease with heart failure and stage 1 through stage 4 chronic kidney disease, or unspecified chronic kidney disease: Secondary | ICD-10-CM | POA: Insufficient documentation

## 2021-07-16 DIAGNOSIS — Z886 Allergy status to analgesic agent status: Secondary | ICD-10-CM | POA: Diagnosis not present

## 2021-07-16 DIAGNOSIS — N183 Chronic kidney disease, stage 3 unspecified: Secondary | ICD-10-CM | POA: Insufficient documentation

## 2021-07-16 DIAGNOSIS — Z91041 Radiographic dye allergy status: Secondary | ICD-10-CM | POA: Insufficient documentation

## 2021-07-16 DIAGNOSIS — Z951 Presence of aortocoronary bypass graft: Secondary | ICD-10-CM | POA: Diagnosis not present

## 2021-07-16 DIAGNOSIS — Z79899 Other long term (current) drug therapy: Secondary | ICD-10-CM | POA: Insufficient documentation

## 2021-07-16 DIAGNOSIS — M810 Age-related osteoporosis without current pathological fracture: Secondary | ICD-10-CM | POA: Insufficient documentation

## 2021-07-16 DIAGNOSIS — I509 Heart failure, unspecified: Secondary | ICD-10-CM | POA: Insufficient documentation

## 2021-07-16 DIAGNOSIS — Z419 Encounter for procedure for purposes other than remedying health state, unspecified: Secondary | ICD-10-CM

## 2021-07-16 HISTORY — PX: KYPHOPLASTY: SHX5884

## 2021-07-16 SURGERY — KYPHOPLASTY
Anesthesia: General

## 2021-07-16 MED ORDER — ONDANSETRON HCL 4 MG/2ML IJ SOLN: 4.0000 mg | Freq: Once | INTRAMUSCULAR | Status: DC | PRN

## 2021-07-16 MED ORDER — METOCLOPRAMIDE HCL 5 MG/ML IJ SOLN: 5.0000 mg | Freq: Three times a day (TID) | INTRAMUSCULAR | Status: DC | PRN

## 2021-07-16 MED ORDER — PHENYLEPHRINE HCL (PRESSORS) 10 MG/ML IV SOLN
INTRAVENOUS | Status: DC | PRN
  Administered 2021-07-16 (×4): 100 ug via INTRAVENOUS

## 2021-07-16 MED ORDER — LIDOCAINE HCL (PF) 1 % IJ SOLN
INTRAMUSCULAR | Status: DC | PRN
  Administered 2021-07-16: 40 mL

## 2021-07-16 MED ORDER — FAMOTIDINE 20 MG PO TABS
ORAL_TABLET | ORAL | Status: AC
  Administered 2021-07-16: 20 mg via ORAL
  Filled 2021-07-16: qty 1

## 2021-07-16 MED ORDER — LACTATED RINGERS IV SOLN
INTRAVENOUS | Status: DC
Start: 2021-07-16 — End: 2021-07-16
  Administered 2021-07-16: 10 mL via INTRAVENOUS

## 2021-07-16 MED ORDER — SODIUM CHLORIDE 0.9 % IV SOLN: INTRAVENOUS | Status: DC

## 2021-07-16 MED ORDER — CHLORHEXIDINE GLUCONATE 0.12 % MT SOLN
OROMUCOSAL | Status: AC
  Administered 2021-07-16: 15 mL via OROMUCOSAL
  Filled 2021-07-16: qty 15

## 2021-07-16 MED ORDER — ORAL CARE MOUTH RINSE: 15.0000 mL | Freq: Once | OROMUCOSAL | Status: AC

## 2021-07-16 MED ORDER — FENTANYL CITRATE (PF) 100 MCG/2ML IJ SOLN: 25.0000 ug | INTRAMUSCULAR | Status: DC | PRN

## 2021-07-16 MED ORDER — HYDROCODONE-ACETAMINOPHEN 5-325 MG PO TABS: 1.0000 | ORAL_TABLET | Freq: Four times a day (QID) | ORAL | 0 refills | Status: DC | PRN

## 2021-07-16 MED ORDER — MIDAZOLAM HCL 2 MG/2ML IJ SOLN
INTRAMUSCULAR | Status: DC | PRN
  Administered 2021-07-16 (×2): .5 mg via INTRAVENOUS
  Administered 2021-07-16: 1 mg via INTRAVENOUS

## 2021-07-16 MED ORDER — CEFAZOLIN SODIUM-DEXTROSE 2-4 GM/100ML-% IV SOLN
INTRAVENOUS | Status: AC
  Filled 2021-07-16: qty 100

## 2021-07-16 MED ORDER — CEFAZOLIN SODIUM-DEXTROSE 2-4 GM/100ML-% IV SOLN: 2.0000 g | INTRAVENOUS | Status: DC

## 2021-07-16 MED ORDER — FENTANYL CITRATE (PF) 100 MCG/2ML IJ SOLN
INTRAMUSCULAR | Status: AC
  Filled 2021-07-16: qty 2

## 2021-07-16 MED ORDER — ONDANSETRON HCL 4 MG PO TABS: 4.0000 mg | ORAL_TABLET | Freq: Four times a day (QID) | ORAL | Status: DC | PRN

## 2021-07-16 MED ORDER — PROPOFOL 500 MG/50ML IV EMUL
INTRAVENOUS | Status: DC | PRN
  Administered 2021-07-16: 30 ug/kg/min via INTRAVENOUS

## 2021-07-16 MED ORDER — CHLORHEXIDINE GLUCONATE 0.12 % MT SOLN: 15.0000 mL | Freq: Once | OROMUCOSAL | Status: AC

## 2021-07-16 MED ORDER — SODIUM CHLORIDE FLUSH 0.9 % IV SOLN
INTRAVENOUS | Status: AC
  Filled 2021-07-16: qty 20

## 2021-07-16 MED ORDER — FAMOTIDINE 20 MG PO TABS: 20.0000 mg | ORAL_TABLET | Freq: Once | ORAL | Status: AC

## 2021-07-16 MED ORDER — MIDAZOLAM HCL 2 MG/2ML IJ SOLN
INTRAMUSCULAR | Status: AC
  Filled 2021-07-16: qty 2

## 2021-07-16 MED ORDER — IOHEXOL 180 MG/ML  SOLN
INTRAMUSCULAR | Status: DC | PRN
  Administered 2021-07-16: 10 mL

## 2021-07-16 MED ORDER — FENTANYL CITRATE (PF) 100 MCG/2ML IJ SOLN
INTRAMUSCULAR | Status: DC | PRN
  Administered 2021-07-16 (×2): 25 ug via INTRAVENOUS
  Administered 2021-07-16: 50 ug via INTRAVENOUS

## 2021-07-16 MED ORDER — LIDOCAINE HCL 1 % IJ SOLN
INTRAMUSCULAR | Status: DC | PRN
  Administered 2021-07-16: 10 mL

## 2021-07-16 MED ORDER — LIDOCAINE HCL (PF) 1 % IJ SOLN
INTRAMUSCULAR | Status: AC
  Filled 2021-07-16: qty 30

## 2021-07-16 MED ORDER — METOCLOPRAMIDE HCL 10 MG PO TABS: 5.0000 mg | ORAL_TABLET | Freq: Three times a day (TID) | ORAL | Status: DC | PRN

## 2021-07-16 MED ORDER — ONDANSETRON HCL 4 MG/2ML IJ SOLN: 4.0000 mg | Freq: Four times a day (QID) | INTRAMUSCULAR | Status: DC | PRN

## 2021-07-16 MED ORDER — MEPERIDINE HCL 25 MG/ML IJ SOLN: 6.2500 mg | INTRAMUSCULAR | Status: DC | PRN

## 2021-07-16 SURGICAL SUPPLY — 22 items
ADH SKN CLS APL DERMABOND .7 (GAUZE/BANDAGES/DRESSINGS) ×1
CEMENT KYPHON CX01A KIT/MIXER (Cement) ×2 IMPLANT
DERMABOND ADVANCED (GAUZE/BANDAGES/DRESSINGS) ×1
DERMABOND ADVANCED .7 DNX12 (GAUZE/BANDAGES/DRESSINGS) ×1 IMPLANT
DEVICE BIOPSY BONE KYPHX (INSTRUMENTS) ×2 IMPLANT
DRAPE C-ARM XRAY 36X54 (DRAPES) ×2 IMPLANT
DURAPREP 26ML APPLICATOR (WOUND CARE) ×2 IMPLANT
GAUZE 4X4 16PLY ~~LOC~~+RFID DBL (SPONGE) ×2 IMPLANT
GLOVE SURG SYN 9.0  PF PI (GLOVE) ×1
GLOVE SURG SYN 9.0 PF PI (GLOVE) ×1 IMPLANT
GOWN SRG 2XL LVL 4 RGLN SLV (GOWNS) ×1 IMPLANT
GOWN STRL NON-REIN 2XL LVL4 (GOWNS) ×2
GOWN STRL REUS W/ TWL LRG LVL3 (GOWN DISPOSABLE) ×1 IMPLANT
GOWN STRL REUS W/TWL LRG LVL3 (GOWN DISPOSABLE) ×2
MANIFOLD NEPTUNE II (INSTRUMENTS) ×2 IMPLANT
PACK KYPHOPLASTY (MISCELLANEOUS) ×2 IMPLANT
RENTAL RFA GENERATOR (MISCELLANEOUS) IMPLANT
STRAP SAFETY 5IN WIDE (MISCELLANEOUS) ×2 IMPLANT
SWABSTK COMLB BENZOIN TINCTURE (MISCELLANEOUS) IMPLANT
TRAY KYPHOPAK 15/3 EXPRESS 1ST (MISCELLANEOUS) IMPLANT
TRAY KYPHOPAK 20/3 EXPRESS 1ST (MISCELLANEOUS) ×2 IMPLANT
WATER STERILE IRR 500ML POUR (IV SOLUTION) IMPLANT

## 2021-07-16 NOTE — H&P (Signed)
Chief Complaint  Patient presents with   Middle Back - Pain    History of the Present Illness: Mario Patrick is a 80 y.o. male here today.   The patient presents for evaluation of an L1 compression fracture. [facility] is reviewed. Date of MRI was 06/23/2021. MRI shows significant edema at L1 with superior end plate compression. There is also some abnormality into the L2 pedicle, but that is probably vascular. No evidence of nerve root compression. He comes in for discussion of his back problem and possible kyphoplasty.  The patient states he had a fall more than 2 months ago. He has not had multiple falls. He rates his pain as a 7 out of 10. He locates his pain across the low back around L4. The patient states he is able to get around and take care of himself. He states his son takes him to the grocery store. The patient presents with a male companion who states the patient takes half a pill of hydrocodone at night. The patient has also received injections.  The patient states he was active prior to his fracture. He did a lot of home renovations. His companion states he mowed the lawn, worked in the garage and did some welding. He had epidural injections in 04/2021, and he is still having pain.  The patient has lung problems and uses inhalers. He is not on blood thinners.  The patient is originally from [place], [place]. I have reviewed past medical, surgical, social and family history, and allergies as documented in the EMR.  Past Medical History: Past Medical History:  Diagnosis Date   Allergic state 2000  contrast dye - nausia   Anxiety   Arthritis  hands and wrists   Band-shaped keratopathy OD   Bilateral renal cysts 01/11/2018  MRI done 12/2017 [facility]   Blind painful eye  OD   BRAO (branch retinal artery occlusion)  OS   CAD (coronary artery disease)   CAD (coronary artery disease)  CAD s/p CABG x 3 vessels in 2005 ([facility]), COPD dx in 2001   Carotid  artery disease (CMS-HCC)   Carotid stenosis, bilateral 08/07/2015  100% stenosis b/l.   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 CT orbits with out mass, laboratory evaluation including Thyroid panel, PPD, ANCA, ANA RPR, FTA-ABS normal.   Chronic bilateral low back pain with bilateral sciatica   Chronic kidney disease (CKD), stage III (moderate) (CMS-HCC)  followed by [facility] nephrology Baseline Cr 1.7-1.8   Chronic right shoulder pain  s/p rotator cuff repair   Colon polyps 06/26/2012  Colonoscopy at [facility]   COPD (chronic obstructive pulmonary disease) (CMS-HCC) 05/11/2013   Depression 2000   History of non-ST elevation myocardial infarction (NSTEMI) 03/13/2021   Hyperlipidemia   Hypertension   Insomnia   Osteoporosis   PAD (peripheral artery disease) (CMS-HCC)   Prediabetes 01/31/2014   Pseudophakia, left eye   Renal insufficiency  followed by [facility] nephrology   Retinopathy of prematurity   Thrombocytopenia (CMS-HCC) 07/05/2013  90,000-150, 000 platelet range Followed by [facility]   Unspecified disorder of choroid OS   Unspecified vitamin D deficiency 05/10/2012   Vision abnormalities   Vision loss of right eye   Past Surgical History: Past Surgical History:  Procedure Laterality Date   APPENDECTOMY  childhood   cervical disc 1999   CLOSURE EYELIDS FROST SUTURE Right 01/23/2016  Procedure: TEMPORARY CLOSURE OF EYELIDS BY SUTURE (EG, FROST SUTURE); Surgeon: Dorisann Frames, MD; Location: [place]; Service: Ophthalmology; Laterality: Right;   COLONOSCOPY W/BIOPSY N/A 03/25/2016  Procedure: COLORECTAL CANCER SCREENING; Surgeon: Erenest Rasher, MD; Location: [place]; Service: Gastroenterology; Laterality: N/A;   CORONARY ARTERY BYPASS GRAFT    CORONARY ARTERY BYPASS W/ARTERIAL GRAFTS 2004, approx   ENUCLEATION EYE W/INSERTION ORBITAL IMPLANT Right 01/23/2016  Procedure: ENUCLEATION EYE W/INSERTION ORBITAL IMPLANT; Surgeon: Dorisann Frames, MD; Location: [place]; Service: Ophthalmology; Laterality: Right;   FEMORAL ARTERY - FEMORAL ARTERY BYPASS GRAFT 2000, approx   LENS EYE SURGERY   ROTATOR CUFF REPAIR Right   SPINE SURGERY 1998  Neck   TONSILLECTOMY 1947   Past Family History: Family History  Problem Relation Age of Onset   No Known Problems Mother   Cancer Father   Other Father  Lung cancer   Coronary Artery Disease (Blocked arteries around heart) Father  High blood pressure. Cancer. Alcohol abuse   Peripheral vascular disease Sister   Peripheral vascular disease Brother   Glaucoma Neg Hx   Macular degeneration Neg Hx   Medications: Current Outpatient Medications Ordered in Epic  Medication Sig Dispense Refill   albuterol 90 mcg/actuation inhaler Inhale 2 inhalations into the lungs every 6 (six) hours as needed 3 each 3   amLODIPine (NORVASC) 5 MG tablet Take 1 tablet by mouth once daily   atorvastatin (LIPITOR) 80 MG tablet Take 1 tablet (80 mg total) by mouth nightly 90 tablet 3   ezetimibe (ZETIA) 10 mg tablet Take 1 tablet (10 mg total) by mouth once daily 90 tablet 1   FUROsemide (LASIX) 20 MG tablet Take 0.5 tablets (10 mg total) by mouth once daily 30 tablet 11   HYDROcodone-acetaminophen (NORCO) 5-325 mg tablet Take 1 tablet p.o. twice daily as needed severe pain 20 tablet 0   lisinopriL (ZESTRIL) 10 MG tablet Take 1 tablet (10 mg total) by mouth once daily 30 tablet 11   meclizine (ANTIVERT) 25 mg tablet Take 1 tablet (25 mg total) by mouth 3 (three) times daily as needed for Dizziness 30 tablet 11   methocarbamoL (ROBAXIN) 500 MG tablet Take 1 tablet (500 mg total) by mouth 3 (three) times daily as needed 30 tablet 5   mirtazapine (REMERON) 7.5 MG tablet Take 1 tablet (7.5 mg total) by mouth nightly 90  tablet 3   nitroGLYcerin (NITROSTAT) 0.4 MG SL tablet Place 1 tablet (0.4 mg total) under the tongue every 5 (five) minutes as needed for Chest pain May take up to 3 doses. 25 tablet 11   sertraline (ZOLOFT) 100 MG tablet Take 1 tablet (100 mg total) by mouth once daily for 337 days 90 tablet 3   spironolactone (ALDACTONE) 25 MG tablet Take 1 tablet by mouth once daily   traZODone (DESYREL) 100 MG tablet Take 1 tablet (100 mg total) by mouth nightly 90 tablet 3   traZODone (DESYREL) 50 MG tablet   albuterol (PROVENTIL) 2.5 mg /3 mL (0.083 %) nebulizer solution Take 3 mLs (2.5 mg total) by nebulization every 6 (six) hours as needed for Wheezing 75 mL 3   ergocalciferol, vitamin D2, 1,250 mcg (50,000 unit) capsule Take 1 capsule (50,000 Units total) by mouth once a week for 30 days 12 capsule 0   fluticasone propion-salmeteroL (ADVAIR DISKUS) 250-50 mcg/dose diskus inhaler Inhale  1 inhalation into the lungs every 12 (twelve) hours 180 each 3   tiotropium (SPIRIVA WITH HANDIHALER) 18 mcg inhalation capsule Place 1 capsule (18 mcg total) into inhaler and inhale once daily 90 capsule 3   No current Epic-ordered facility-administered medications on file.   Allergies: Allergies  Allergen Reactions   Iodinated Contrast Media Nausea  GI upset   Aspirin Other (See Comments)  thrombocytopenia    Body mass index is 19.65 kg/m.  Review of Systems: A comprehensive 14 point ROS was performed, reviewed, and the pertinent orthopaedic findings are documented in the HPI.  Vitals:  07/13/21 1024  BP: 128/72    General Physical Examination:   General/Constitutional: No apparent distress: well-nourished and well developed. Eyes: Pupils equal, round with synchronous movement. Lungs: Clear to auscultation, no wheezing noted HEENT is full dentures. Vascular: No edema, swelling or tenderness, except as noted in detailed exam. Cardiac: Heart rate and rhythm is regular. Integumentary: No impressive skin  lesions present, except as noted in detailed exam. Neuro/Psych: Normal mood and affect, oriented to person, place, and time.  Musculoskeletal Examination:  On exam, tenderness to L1 and L4.  Radiographs:  No new imaging studies were obtained or reviewed today.  Assessment: ICD-10-CM  1. Closed wedge compression fracture of L1 vertebra, initial encounter (CMS-HCC) S32.010A   Plan:  The patient has clinical findings of painful L1 compression fracture for over 2 months duration.  We discussed the patient's prior MRI findings. I recommend L1 kyphoplasty. I explained the surgery and postoperative course in detail. I gave the patient a brochure on kyphoplasty. The patient will be scheduled for surgery in the near future.   Surgical Risks:  The nature of the condition and the proposed procedure has been reviewed in detail with the patient. Surgical versus non-surgical options and prognosis for recovery have been reviewed and the inherent risks and benefits of each have been discussed including the risks of infection, bleeding, injury to nerves/blood vessels/tendons, incomplete relief of symptoms, persisting pain and/or stiffness, loss of function, complex regional pain syndrome, failure of the procedure, as appropriate.  Teeth: Full dentures  Scribe Attestation: I, Dawn Royse, am acting as scribe for Lauris Poag, MD.   Electronically signed by Lauris Poag, MD at 07/13/2021 10:23 PM EDT Reviewed  H+P. No changes noted.

## 2021-07-16 NOTE — Anesthesia Preprocedure Evaluation (Addendum)
Anesthesia Evaluation  Patient identified by MRN, date of birth, ID band Patient awake    Reviewed: Allergy & Precautions, NPO status , Patient's Chart, lab work & pertinent test results  Airway Mallampati: II  TM Distance: >3 FB Neck ROM: Full    Dental  (+) Lower Dentures, Edentulous Upper, Edentulous Lower, Partial Upper   Pulmonary shortness of breath and with exertion, COPD,  COPD inhaler, former smoker,    Pulmonary exam normal        Cardiovascular hypertension, + angina + CAD, + Past MI, + CABG and +CHF  Normal cardiovascular exam     Neuro/Psych PSYCHIATRIC DISORDERS Depression negative neurological ROS     GI/Hepatic negative GI ROS, Neg liver ROS,   Endo/Other  negative endocrine ROS  Renal/GU Renal disease  negative genitourinary   Musculoskeletal  (+) Arthritis , Osteoarthritis,    Abdominal   Peds negative pediatric ROS (+)  Hematology negative hematology ROS (+)   Anesthesia Other Findings Anginal pain (HCC)    Arthritis    Back ache    CHF (congestive heart failure) (HCC) Chronic kidney disease   COPD    Coronary artery disease   Depression    Dyspnea    Hypercholesteremia    Hypertension    Myocardial infarction (HCC) Left ventricular ejection fraction, by estimation, is 55 to 60%   Reproductive/Obstetrics negative OB ROS                             Anesthesia Physical Anesthesia Plan  ASA: 3  Anesthesia Plan: General   Post-op Pain Management:    Induction: Intravenous  PONV Risk Score and Plan: 2 and Midazolam and Ondansetron  Airway Management Planned: Natural Airway, Nasal Cannula and Simple Face Mask  Additional Equipment:   Intra-op Plan:   Post-operative Plan:   Informed Consent: I have reviewed the patients History and Physical, chart, labs and discussed the procedure including the risks, benefits and alternatives for the proposed  anesthesia with the patient or authorized representative who has indicated his/her understanding and acceptance.       Plan Discussed with: CRNA, Anesthesiologist and Surgeon  Anesthesia Plan Comments:        Anesthesia Quick Evaluation

## 2021-07-16 NOTE — Op Note (Signed)
07/16/2021  3:21 PM  PATIENT:  Mario Patrick   MRN: [id number]   PRE-OPERATIVE DIAGNOSIS:  closed wedge compression fracture of L1   POST-OPERATIVE DIAGNOSIS:  closed wedge compression fracture of L1   PROCEDURE:  Procedure(s): KYPHOPLASTY L1  SURGEON: Laurene Footman, MD   ASSISTANTS: None   ANESTHESIA:   local and MAC   EBL:  No intake/output data recorded.   BLOOD ADMINISTERED:none   DRAINS: none    LOCAL MEDICATIONS USED:  MARCAINE    and XYLOCAINE    SPECIMEN: L1 vertebral body biopsy   DISPOSITION OF SPECIMEN: Pathology   COUNTS:  YES   TOURNIQUET:  * No tourniquets in log *   IMPLANTS: Bone cement   DICTATION: .Dragon Dictation  patient was brought to the operating room and after adequate anesthesia was obtained the patient was placed prone.  C arm was brought in in good visualization of the affected level obtained on both AP and lateral projections.  After patient identification and timeout procedures were completed, local anesthetic was infiltrated with 10 cc 1% Xylocaine infiltrated subcutaneously.  This is done the area on the each side of the planned approach.  The back was then prepped and draped in the usual sterile manner and repeat timeout procedure carried out.  A spinal needle was brought down to the pedicle on the each side of L1 and a 50-50 mix of 1% Xylocaine half percent Sensorcaine with epinephrine total of 20 cc injected on each side.  After allowing this to set a small incision was made and the trocar was advanced into the vertebral body in an extrapedicular fashion.  Biopsy was obtained drilling was carried out balloon inserted with inflation to 3-1/2 cc on the right and across the midline so second stick was not required.  When the cement was appropriate consistency 6-1/2 cc were injected on the right anto the vertebral body without extravasation, good fill superior to inferior endplates and from right to left sides along the inferior endplate.   After the cement had set the trochar was removed and permanent C-arm views obtained.  The wound was closed with Dermabond followed by Band-Aid   PLAN OF CARE: Discharge home after recovery room   PATIENT DISPOSITION:  PACU - hemodynamically stable.

## 2021-07-16 NOTE — Discharge Instructions (Addendum)
Take it easy today and try to get more active tomorrow. Try to avoid lifting but try to start walking is much as you can starting tomorrow Pain medicine as directed Remove Band-Aid on Saturday then okay to shower Call office if you are having any problems  AMBULATORY SURGERY  DISCHARGE INSTRUCTIONS   The drugs that you were given will stay in your system until tomorrow so for the next 24 hours you should not:  Drive an automobile Make any legal decisions Drink any alcoholic beverage   You may resume regular meals tomorrow.  Today it is better to start with liquids and gradually work up to solid foods.  You may eat anything you prefer, but it is better to start with liquids, then soup and crackers, and gradually work up to solid foods.   Please notify your doctor immediately if you have any unusual bleeding, trouble breathing, redness and pain at the surgery site, drainage, fever, or pain not relieved by medication.    Your post-operative visit with Dr.                                       is: Date:                        Time:    Please call to schedule your post-operative visit.  Additional Instructions:

## 2021-07-17 ENCOUNTER — Encounter: Payer: Self-pay | Admitting: Orthopedic Surgery

## 2021-07-17 NOTE — Anesthesia Postprocedure Evaluation (Signed)
Anesthesia Post Note  Patient: Mario Patrick  Procedure(s) Performed: L1 KYPHOPLASTY  Patient location during evaluation: PACU Anesthesia Type: MAC Level of consciousness: awake and alert Pain management: pain level controlled Vital Signs Assessment: post-procedure vital signs reviewed and stable Respiratory status: spontaneous breathing, nonlabored ventilation and respiratory function stable Cardiovascular status: blood pressure returned to baseline and stable Postop Assessment: no apparent nausea or vomiting Anesthetic complications: no   No notable events documented.   Last Vitals:  Vitals:   07/16/21 1530 07/16/21 1614  BP:  108/74  Pulse:  72  Resp:  14  Temp: (!) 36.4 C (!) 36.1 C  SpO2:  95%    Last Pain:  Vitals:   07/16/21 1614  TempSrc: Temporal  PainSc: 0-No pain                 [place] Ouch

## 2021-07-17 NOTE — Transfer of Care (Signed)
Immediate Anesthesia Transfer of Care Note  Patient: Mario Patrick  Procedure(s) Performed: L1 KYPHOPLASTY  Patient Location: PACU  Anesthesia Type:MAC  Level of Consciousness: awake, alert  and oriented  Airway & Oxygen Therapy: Patient Spontanous Breathing and Patient connected to nasal cannula oxygen  Post-op Assessment: Report given to RN and Post -op Vital signs reviewed and stable  Post vital signs: Reviewed and stable  Last Vitals:  Vitals Value Taken Time  BP 108/74 07/16/21 1614  Temp 36.1 C 07/16/21 1614  Pulse 72 07/16/21 1614  Resp 14 07/16/21 1614  SpO2 95 % 07/16/21 1614    Last Pain:  Vitals:   07/16/21 1614  TempSrc: Temporal  PainSc: 0-No pain         Complications: No notable events documented.

## 2021-07-18 ENCOUNTER — Other Ambulatory Visit: Payer: Self-pay | Admitting: Internal Medicine

## 2021-07-20 LAB — SURGICAL PATHOLOGY

## 2021-08-15 ENCOUNTER — Other Ambulatory Visit: Payer: Self-pay | Admitting: Family Medicine

## 2021-08-27 ENCOUNTER — Other Ambulatory Visit: Payer: Self-pay | Admitting: Nephrology

## 2021-08-27 ENCOUNTER — Other Ambulatory Visit (HOSPITAL_BASED_OUTPATIENT_CLINIC_OR_DEPARTMENT_OTHER): Payer: Self-pay | Admitting: Nephrology

## 2021-08-27 DIAGNOSIS — N184 Chronic kidney disease, stage 4 (severe): Secondary | ICD-10-CM

## 2021-09-09 ENCOUNTER — Ambulatory Visit: Payer: TRICARE For Life (TFL)

## 2021-09-16 ENCOUNTER — Other Ambulatory Visit: Payer: Self-pay

## 2021-09-16 ENCOUNTER — Ambulatory Visit
Admission: RE | Admit: 2021-09-16 | Discharge: 2021-09-16 | Disposition: A | Discharge status: Home / Self Care | Payer: Medicare HMO | Source: Ambulatory Visit | Attending: Nephrology | Admitting: Nephrology

## 2021-09-16 DIAGNOSIS — N184 Chronic kidney disease, stage 4 (severe): Secondary | ICD-10-CM | POA: Diagnosis present

## 2022-10-05 IMAGING — CR DG HIP (WITH OR WITHOUT PELVIS) 2-3V*L*
3 series · 3 of 3 positions shown · non-contrast
Comparison: None.

CLINICAL DATA: Left hip pain.  No recent fall or injury

EXAM:
DG HIP (WITH OR WITHOUT PELVIS) 2-3V LEFT

[pelvis ap]
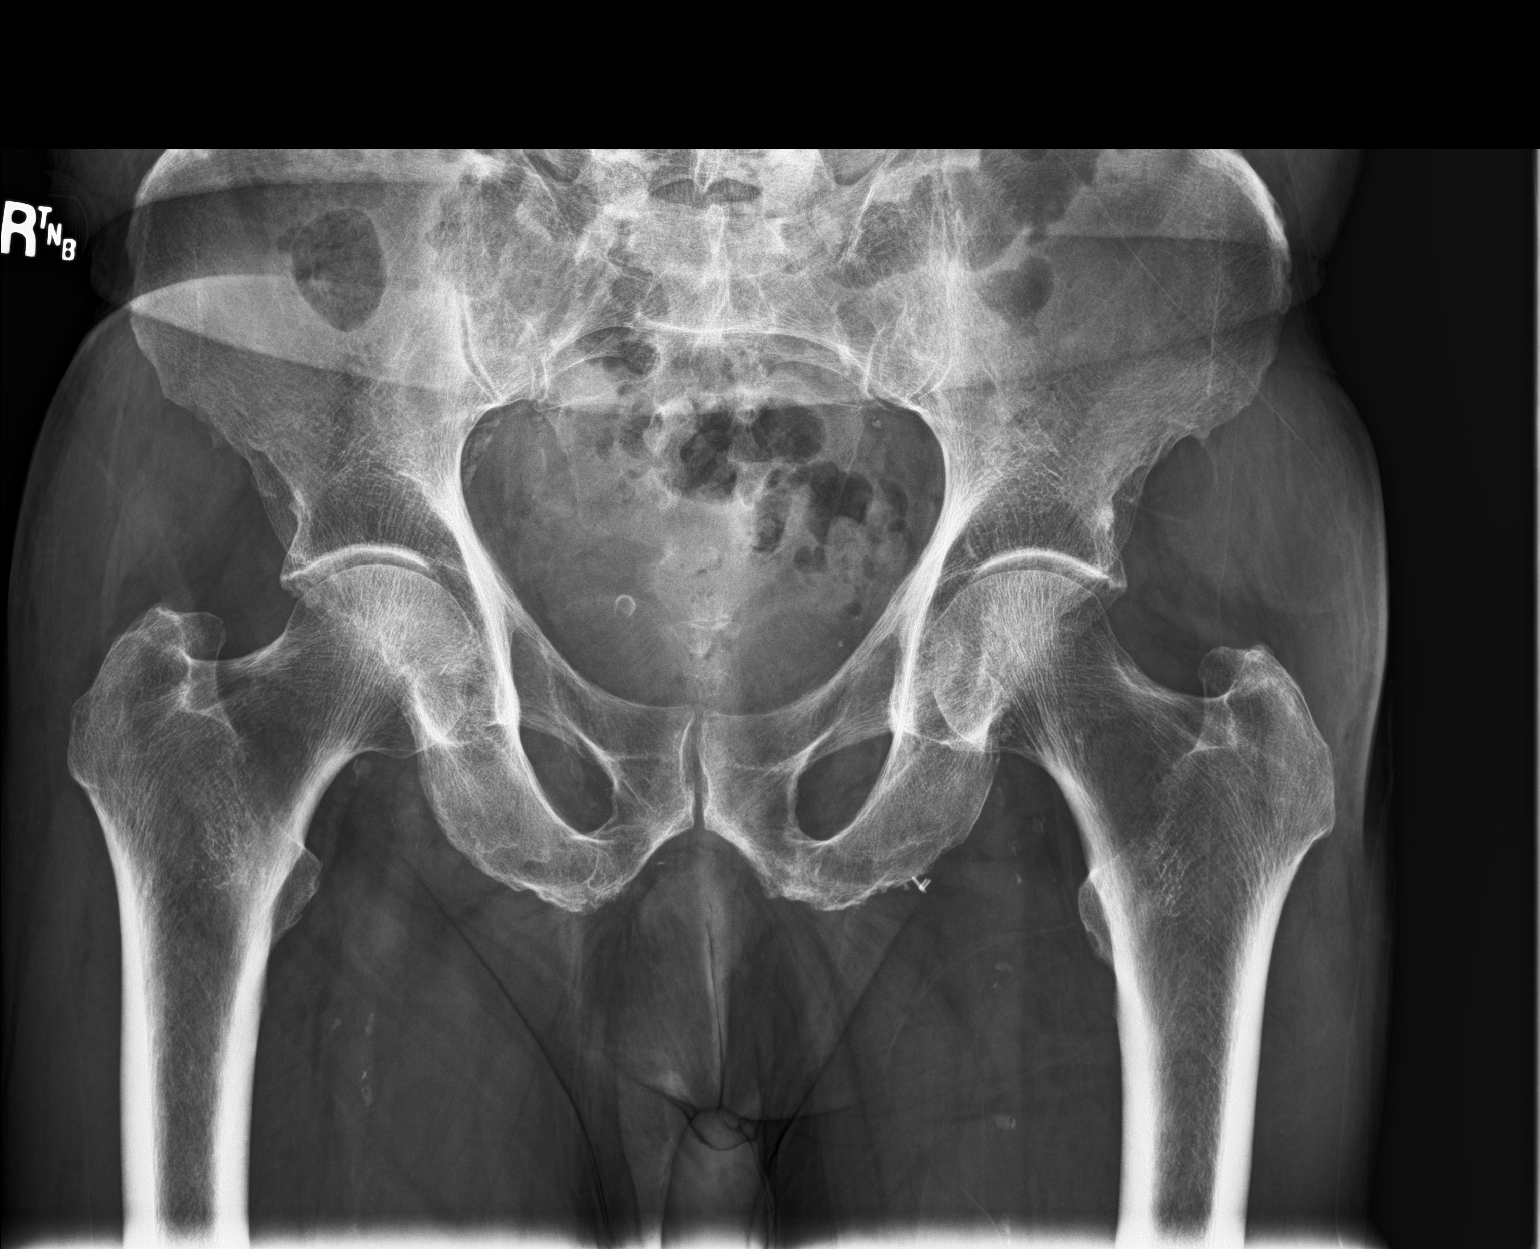

[hip ap]
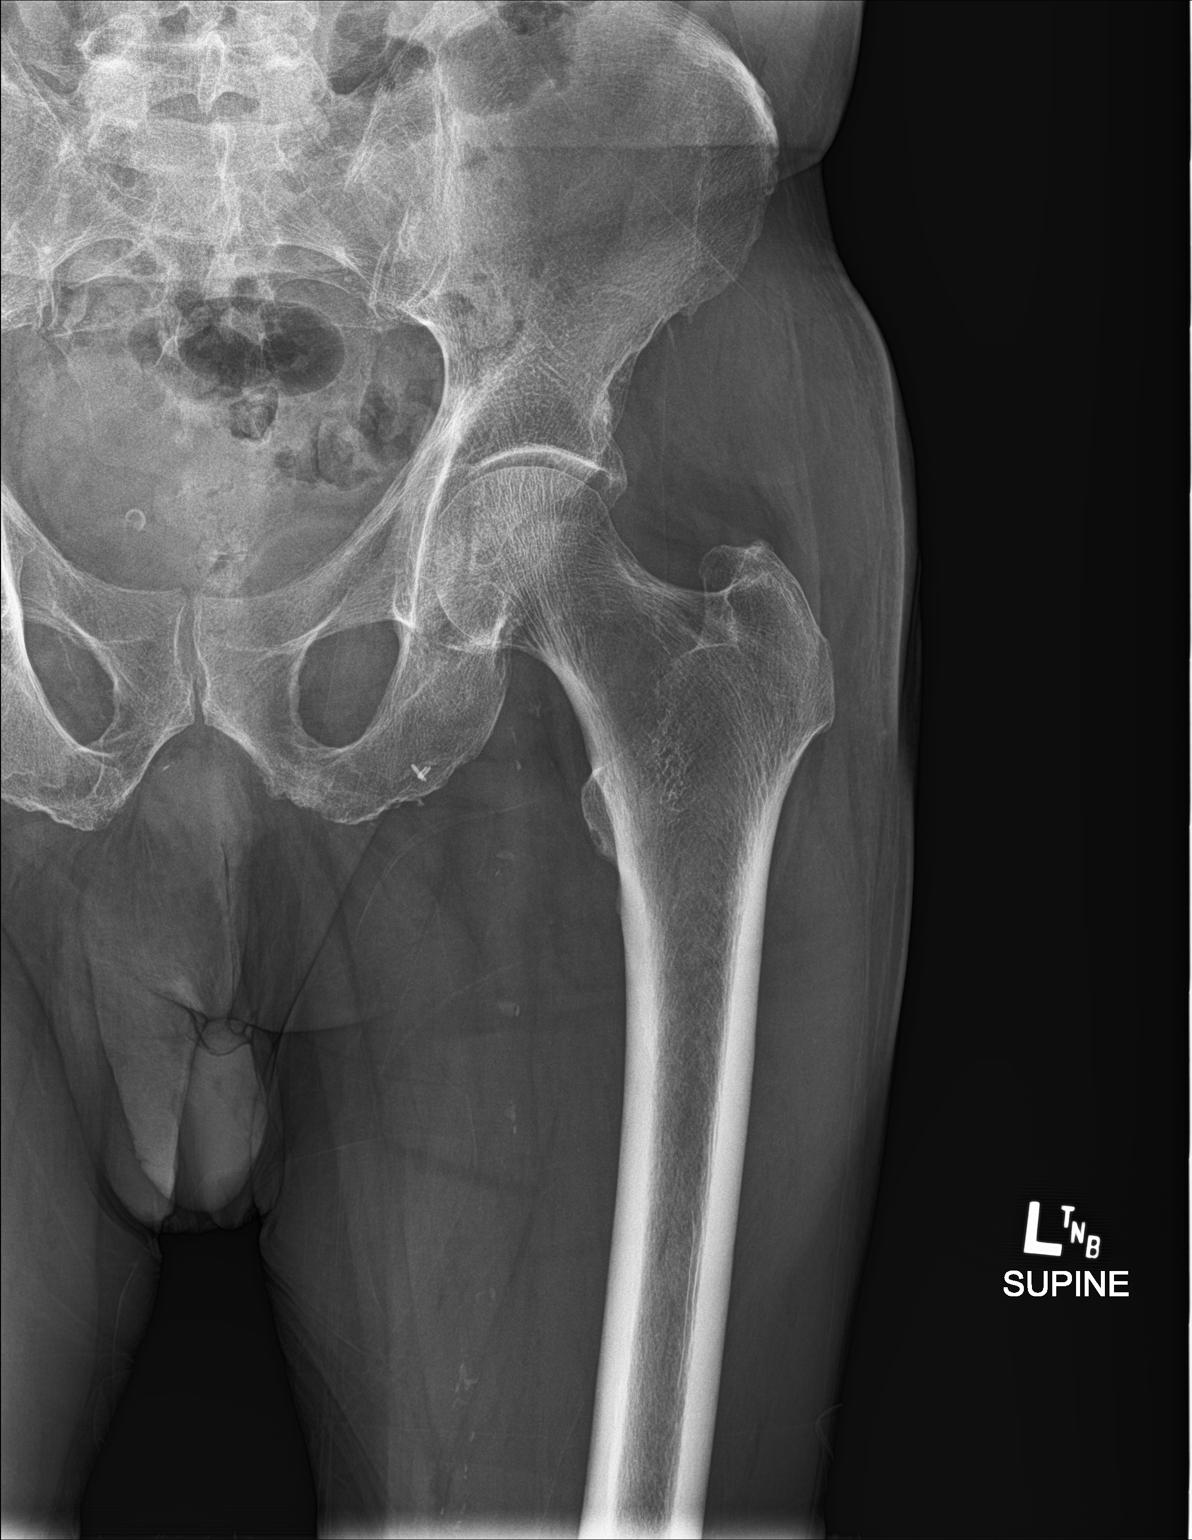

[hip lat]
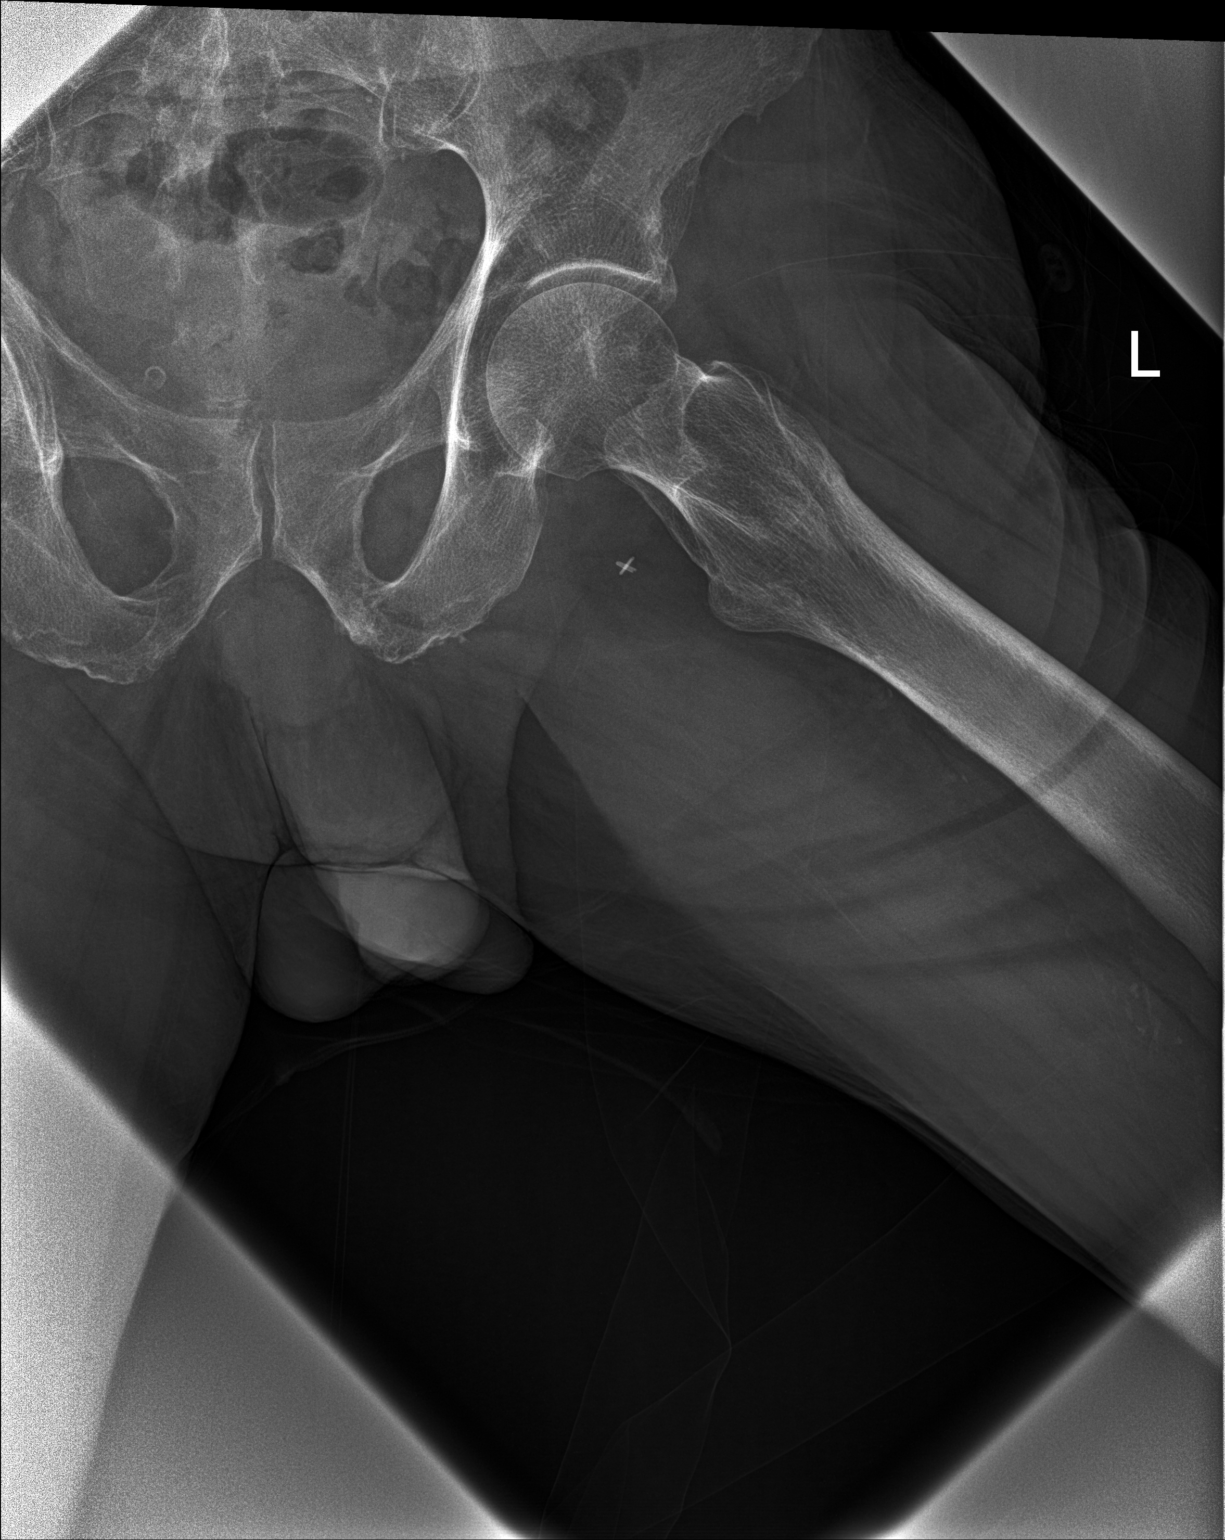

[3 of 3 positions shown; findings below may reference images not displayed]

FINDINGS: There is no evidence of hip fracture or dislocation. Mild joint
space narrowing of the bilateral hips. Otherwise, no focal bone
abnormality. Vascular calcifications are present.
IMPRESSION: No acute osseous abnormality.

## 2022-11-02 ENCOUNTER — Ambulatory Visit (INDEPENDENT_AMBULATORY_CARE_PROVIDER_SITE_OTHER): Payer: Medicare Other | Admitting: Urology

## 2022-11-02 VITALS — BP 126/73 | HR 57 | Ht 67.0 in | Wt 124.0 lb

## 2022-11-02 DIAGNOSIS — N401 Enlarged prostate with lower urinary tract symptoms: Secondary | ICD-10-CM

## 2022-11-02 DIAGNOSIS — N390 Urinary tract infection, site not specified: Secondary | ICD-10-CM

## 2022-11-02 DIAGNOSIS — R350 Frequency of micturition: Secondary | ICD-10-CM | POA: Diagnosis not present

## 2022-11-02 DIAGNOSIS — R339 Retention of urine, unspecified: Secondary | ICD-10-CM | POA: Diagnosis not present

## 2022-11-02 DIAGNOSIS — R972 Elevated prostate specific antigen [PSA]: Secondary | ICD-10-CM | POA: Diagnosis not present

## 2022-11-02 LAB — MICROSCOPIC EXAMINATION

## 2022-11-02 LAB — URINALYSIS, COMPLETE
Bilirubin, UA: NEGATIVE
Ketones, UA: NEGATIVE
Leukocytes,UA: NEGATIVE
Nitrite, UA: NEGATIVE
Protein,UA: NEGATIVE
RBC, UA: NEGATIVE
Specific Gravity, UA: 1.01 (ref 1.005–1.030)
Urobilinogen, Ur: 0.2 mg/dL (ref 0.2–1.0)
pH, UA: 5 (ref 5.0–7.5)

## 2022-11-02 LAB — BLADDER SCAN AMB NON-IMAGING

## 2022-11-02 MED ORDER — FINASTERIDE 5 MG PO TABS: 5.0000 mg | ORAL_TABLET | Freq: Every day | ORAL | 3 refills | Status: DC

## 2022-11-02 NOTE — Progress Notes (Unsigned)
11/02/2022 3:40 PM   Mario Patrick September 20, 1941 [id number]  Referring provider: Sherre Scarlet, PA-C [place],   [id number]  Chief Complaint  Patient presents with   Urinary Frequency    HPI: 81 year old male who presents today for further evaluation of possible hematuria along with voiding symptoms.  He was seen and evaluated by his PCP on 10/19/2022 found to have a suspicious appearing urinalysis with 2+ protein, 1+ glucose, 2+ blood and 3+ leukocytes on dip.  There is no microscopic performed.  He did end up growing E. coli a placed on Macrobid twice daily for 5 days.  He returns to his PCP on 10/25/2022 with ongoing urinary symptoms.  At this point, his urinalysis appeared persistently suspicious, no nitrite positive.  No repeat culture was sent.  On this occasion, he was treated with Cipro twice daily for 7 days.  A PSA was checked on the same day in the setting of urinary symptoms noted to be markedly elevated at 7.1, previously within the normal range.  He does have baseline CKD, most recent creatinine 2.8.  This was up from his previous on 08/03/2022.  Notably, per his PCP, has had suspicious appearing urinalysis for the past 10 or more years.  He is also former extensive smoker, greater than 50 pack years although he quit smoking about 20 years ago.  He is also had recent weight loss and night sweats.  Renal ultrasound on 09/16/2021 without hydronephrosis.  Kidneys were echogenic consistent with medical renal disease.  He is chronically on Flomax 0.8 mg for BPH.  PVR today is 287 cc.  IPSS as below.    Score:  1-7 Mild 8-19 Moderate 20-35 Severe    PMH: Past Medical History:  Diagnosis Date   Anginal pain (HCC)    Arthritis    Back ache    CHF (congestive heart failure) (HCC)    Chronic kidney disease    COPD (chronic obstructive pulmonary disease) (HCC)    Coronary artery disease    Depression    Dyspnea    Hypercholesteremia     Hypertension    Myocardial infarction [facility])     Surgical History: Past Surgical History:  Procedure Laterality Date   AORTIC VALVE REPLACEMENT     BACK SURGERY     CARDIAC SURGERY     CABG X4   COLONOSCOPY     KYPHOPLASTY N/A 07/16/2021   Procedure: L1 KYPHOPLASTY;  Surgeon: Hessie Knows, MD;  Location: [facility] ORS;  Service: Orthopedics;  Laterality: N/A;    Home Medications:  Allergies as of 11/02/2022       Reactions   Contrast Media  [iodinated Contrast Media] Nausea Only   GI upset   Aspirin Other (See Comments)   Thrombocytopenia (PER PT CAN TAKE LOW DOSE ASPIRIN)        Medication List        Accurate as of November 02, 2022  3:40 PM. If you have any questions, ask your nurse or doctor.          STOP taking these medications    HYDROcodone-acetaminophen 5-325 MG tablet Commonly known as: Norco   VITAMIN D PO       TAKE these medications    acetaminophen 500 MG tablet Commonly known as: TYLENOL Take 500 mg by mouth every 6 (six) hours as needed for moderate pain.   albuterol 108 (90 Base) MCG/ACT inhaler Commonly known as: VENTOLIN HFA Inhale 1-2 puffs into the lungs  every 6 (six) hours as needed for wheezing or shortness of breath. What changed: how much to take   amLODipine 5 MG tablet Commonly known as: NORVASC Take 5 mg by mouth daily.   aspirin EC 81 MG tablet Take 81 mg by mouth daily. Swallow whole.   atorvastatin 80 MG tablet Commonly known as: LIPITOR Take 1 tablet (80 mg total) by mouth daily.   cyanocobalamin 1000 MCG tablet Commonly known as: VITAMIN B12 Take by mouth.   diclofenac Sodium 1 % Gel Commonly known as: VOLTAREN Apply 1 application topically 4 (four) times daily as needed for pain.   ezetimibe 10 MG tablet Commonly known as: ZETIA Take 1 tablet (10 mg total) by mouth daily.   finasteride 5 MG tablet Commonly known as: PROSCAR Take 1 tablet (5 mg total) by mouth daily.   fluticasone 50 MCG/ACT nasal  spray Commonly known as: FLONASE SHAKE LIQUID AND USE 2 SPRAYS IN EACH NOSTRIL EVERY DAY   Fluticasone-Salmeterol 250-50 MCG/DOSE Aepb Commonly known as: ADVAIR Inhale 1 puff into the lungs 2 (two) times daily. What changed:  when to take this reasons to take this   furosemide 20 MG tablet Commonly known as: LASIX Take 20 mg by mouth daily.   ipratropium-albuterol 0.5-2.5 (3) MG/3ML Soln Commonly known as: DUONEB Take 3 mLs by nebulization every 6 (six) hours as needed.   Jardiance 10 MG Tabs tablet Generic drug: empagliflozin Take by mouth.   ketoconazole 2 % cream Commonly known as: NIZORAL Apply 1 application topically daily as needed (fungus).   lisinopril 5 MG tablet Commonly known as: ZESTRIL Take 5 mg by mouth daily. What changed: Another medication with the same name was removed. Continue taking this medication, and follow the directions you see here.   meclizine 25 MG tablet Commonly known as: ANTIVERT Take 1 tablet (25 mg total) by mouth 3 (three) times daily as needed for dizziness.   metFORMIN 500 MG 24 hr tablet Commonly known as: GLUCOPHAGE-XR Take 500 mg by mouth daily.   methocarbamol 500 MG tablet Commonly known as: ROBAXIN Take by mouth daily. As needed   metoprolol succinate 25 MG 24 hr tablet Commonly known as: TOPROL-XL Take 1 tablet by mouth daily.   nitroGLYCERIN 0.4 MG SL tablet Commonly known as: NITROSTAT Place 0.4 mg under the tongue every 5 (five) minutes as needed for chest pain.   OVER THE COUNTER MEDICATION Apply 1 application topically daily as needed (pain). Cbd cream   sertraline 100 MG tablet Commonly known as: ZOLOFT Take 1 tablet (100 mg total) by mouth daily.   Spiriva HandiHaler 18 MCG inhalation capsule Generic drug: tiotropium INHALE THE CONTENTS OF 1 CAPSULE VIA INHALATION DEVICE EVERY DAY What changed: See the new instructions.   spironolactone 25 MG tablet Commonly known as: ALDACTONE Take 25 mg by mouth  daily.   tamsulosin 0.4 MG Caps capsule Commonly known as: FLOMAX Take by mouth.   traMADol 50 MG tablet Commonly known as: ULTRAM Take by mouth.   traZODone 50 MG tablet Commonly known as: DESYREL Take 1 tablet by mouth at bedtime. What changed: Another medication with the same name was removed. Continue taking this medication, and follow the directions you see here.        Allergies:  Allergies  Allergen Reactions   Contrast Media  [Iodinated Contrast Media] Nausea Only    GI upset    Aspirin Other (See Comments)    Thrombocytopenia (PER PT CAN TAKE LOW DOSE ASPIRIN)  Family History: Family History  Problem Relation Age of Onset   Lung disease Mother    Cancer Father     Social History:  reports that he has quit smoking. He has never used smokeless tobacco. He reports that he does not drink alcohol and does not use drugs.   Physical Exam: BP 126/73   Pulse (!) 57   Ht 5\' 7"  (1.702 m)   Wt 124 lb (56.2 kg)   BMI 19.42 kg/m   Constitutional:  Alert and oriented, No acute distress. HEENT: [place] AT, moist mucus membranes.  Trachea midline, no masses. Cardiovascular: No clubbing, cyanosis, or edema. Respiratory: Normal respiratory effort, no increased work of breathing. GI: Abdomen is soft, nontender, nondistended, no abdominal masses GU: No CVA tenderness Skin: No rashes, bruises or suspicious lesions. Neurologic: Grossly intact, no focal deficits, moving all 4 extremities. Psychiatric: Normal mood and affect.  Laboratory Data: Lab Results  Component Value Date   WBC 6.2 10/01/2020   HGB 14.2 10/01/2020   HCT 43.7 10/01/2020   MCV 92.8 10/01/2020   PLT 64 (L) 10/01/2020    Lab Results  Component Value Date   CREATININE 1.59 (H) 10/01/2020    No results found for: "PSA"  No results found for: "TESTOSTERONE"  No results found for: "HGBA1C"  Urinalysis    Component Value Date/Time   COLORURINE YELLOW (A) 09/29/2020 0001   APPEARANCEUR CLEAR  (A) 09/29/2020 0001   LABSPEC 1.014 09/29/2020 0001   PHURINE 5.0 09/29/2020 0001   GLUCOSEU NEGATIVE 09/29/2020 0001   HGBUR NEGATIVE 09/29/2020 0001   BILIRUBINUR NEGATIVE 09/29/2020 0001   KETONESUR NEGATIVE 09/29/2020 0001   PROTEINUR NEGATIVE 09/29/2020 0001   NITRITE NEGATIVE 09/29/2020 0001   LEUKOCYTESUR NEGATIVE 09/29/2020 0001    Lab Results  Component Value Date   BACTERIA NONE SEEN 09/29/2020    Pertinent Imaging: *** No results found for this or any previous visit.  No results found for this or any previous visit.  No results found for this or any previous visit.  No results found for this or any previous visit.  Results for orders placed during the hospital encounter of 09/16/21  US RENAL  Narrative CLINICAL DATA:  Stage 4 chronic kidney disease.  EXAM: RENAL / URINARY TRACT ULTRASOUND COMPLETE  COMPARISON:  None.  FINDINGS: Right Kidney:  Renal measurements: 10.8 x 4.4 x 5.0 cm = volume: 124 mL. Diffuse increased renal parenchymal echogenicity. No hydronephrosis or shadowing stone. There is a 2 cm upper pole cyst as well as additional smaller cystic structures.  Left Kidney:  Renal measurements: 9.1 x 4.1 x 4.6 cm = volume: 88 ML. Diffuse parenchymal echogenicity. No hydronephrosis or shadowing stone. Several cysts measuring up to 2.4 cm.  Bladder:  Appears normal for degree of bladder distention.  Other:  The visualized portion of a femorofemoral bypass graft appears patent.  IMPRESSION: 1. Echogenic kidneys in keeping with chronic kidney disease. No hydronephrosis or shadowing stone. 2. Bilateral renal cysts.   Electronically Signed By: Anner Crete M.D. On: 09/18/2021 00:26  No valid procedures specified. No results found for this or any previous visit.  No results found for this or any previous visit.   Assessment & Plan:    1. Urinary frequency ***  2. Nocturia ***  3. Gross hematuria ***   Return for 3  months IPSS PVR.  Hollice Espy, MD  [facility] [address] [place], [place] [address] [phone]

## 2022-11-02 NOTE — Patient Instructions (Addendum)
Decrease Tamsulosin (flomax) to 0.4mg  daily and start Finasteride 5mg  daily.

## 2022-11-03 ENCOUNTER — Ambulatory Visit: Payer: Self-pay | Admitting: Urology

## 2023-01-13 ENCOUNTER — Other Ambulatory Visit: Payer: Self-pay | Admitting: Otolaryngology

## 2023-01-13 DIAGNOSIS — H90A22 Sensorineural hearing loss, unilateral, left ear, with restricted hearing on the contralateral side: Secondary | ICD-10-CM

## 2023-01-24 ENCOUNTER — Ambulatory Visit: Admission: RE | Admit: 2023-01-24 | Payer: Medicare Other | Source: Ambulatory Visit

## 2023-01-27 ENCOUNTER — Ambulatory Visit
Admission: RE | Admit: 2023-01-27 | Discharge: 2023-01-27 | Disposition: A | Discharge status: Home / Self Care | Payer: Medicare Other | Source: Ambulatory Visit | Attending: Otolaryngology | Admitting: Otolaryngology

## 2023-01-27 DIAGNOSIS — H90A22 Sensorineural hearing loss, unilateral, left ear, with restricted hearing on the contralateral side: Secondary | ICD-10-CM | POA: Insufficient documentation

## 2023-01-27 MED ORDER — GADOBUTROL 1 MMOL/ML IV SOLN
6.0000 mL | Freq: Once | INTRAVENOUS | Status: AC | PRN
  Administered 2023-01-27: 6 mL via INTRAVENOUS

## 2023-01-31 ENCOUNTER — Encounter: Payer: Self-pay | Admitting: Urology

## 2023-02-01 ENCOUNTER — Ambulatory Visit (INDEPENDENT_AMBULATORY_CARE_PROVIDER_SITE_OTHER): Payer: Medicare Other | Admitting: Urology

## 2023-02-01 VITALS — BP 155/78 | HR 60 | Ht 67.0 in | Wt 124.0 lb

## 2023-02-01 DIAGNOSIS — R972 Elevated prostate specific antigen [PSA]: Secondary | ICD-10-CM | POA: Diagnosis not present

## 2023-02-01 DIAGNOSIS — R350 Frequency of micturition: Secondary | ICD-10-CM | POA: Diagnosis not present

## 2023-02-01 DIAGNOSIS — N401 Enlarged prostate with lower urinary tract symptoms: Secondary | ICD-10-CM

## 2023-02-01 LAB — URINALYSIS, COMPLETE
Bilirubin, UA: NEGATIVE
Ketones, UA: NEGATIVE
Leukocytes,UA: NEGATIVE
Nitrite, UA: NEGATIVE
Specific Gravity, UA: 1.02 (ref 1.005–1.030)
Urobilinogen, Ur: 0.2 mg/dL (ref 0.2–1.0)
pH, UA: 5.5 (ref 5.0–7.5)

## 2023-02-01 LAB — MICROSCOPIC EXAMINATION

## 2023-02-01 LAB — BLADDER SCAN AMB NON-IMAGING: Scan Result: 216

## 2023-02-01 NOTE — Progress Notes (Signed)
I,Amy L Pierron,acting as a scribe for Hollice Espy, MD.,have documented all relevant documentation on the behalf of Hollice Espy, MD,as directed by  Hollice Espy, MD while in the presence of Hollice Espy, MD.  02/01/2023 3:27 PM   Satira Anis Werts [birth year] [id number]  Referring provider: Sharyne Peach, MD [place],  [place] [address]  Chief Complaint  Patient presents with   Follow-up   Benign Prostatic Hypertrophy    HPI: 82 year-old male returns today for a 3 month follow up. He was last seen in December for multiple issues including BPH with urinary frequency and incomplete bladder emptying, recurrent UTI's, and elevated PSA.  His PVR last visit was 287. His daughter-in-law sent me a MyChart message yesterday indicating that since his last visit, his Lasix had been stopped and they increased the dose of the Lisinopril. He'd been on chronic Flomax, 0.8 mg, and we added Finasteride to his regimen 6 months ago.   His urinalysis today is negative, no blood. He's still having some urgency frequency. His IPSS is 20/6.   He reports that his urinary symptoms have not yet improved.    Results for orders placed or performed in visit on 02/01/23  BLADDER SCAN AMB NON-IMAGING  Result Value Ref Range   Scan Result 216 ml      PMH: Past Medical History:  Diagnosis Date   Anginal pain (HCC)    Arthritis    Back ache    CHF (congestive heart failure) (HCC)    Chronic kidney disease    COPD (chronic obstructive pulmonary disease) (HCC)    Coronary artery disease    Depression    Dyspnea    Hypercholesteremia    Hypertension    Myocardial infarction [facility])     Surgical History: Past Surgical History:  Procedure Laterality Date   AORTIC VALVE REPLACEMENT     BACK SURGERY     CARDIAC SURGERY     CABG X4   COLONOSCOPY     KYPHOPLASTY N/A 07/16/2021   Procedure: L1 KYPHOPLASTY;  Surgeon: Hessie Knows, MD;  Location: [facility] ORS;  Service:  Orthopedics;  Laterality: N/A;    Home Medications:  Allergies as of 02/01/2023       Reactions   Contrast Media  [iodinated Contrast Media] Nausea Only   GI upset   Aspirin Other (See Comments)   Thrombocytopenia (PER PT CAN TAKE LOW DOSE ASPIRIN)        Medication List        Accurate as of February 01, 2023  3:27 PM. If you have any questions, ask your nurse or doctor.          acetaminophen 500 MG tablet Commonly known as: TYLENOL Take 500 mg by mouth every 6 (six) hours as needed for moderate pain.   albuterol 108 (90 Base) MCG/ACT inhaler Commonly known as: VENTOLIN HFA Inhale 1-2 puffs into the lungs every 6 (six) hours as needed for wheezing or shortness of breath. What changed: how much to take   amLODipine 5 MG tablet Commonly known as: NORVASC Take 5 mg by mouth daily.   aspirin EC 81 MG tablet Take 81 mg by mouth daily. Swallow whole.   atorvastatin 80 MG tablet Commonly known as: LIPITOR Take 1 tablet (80 mg total) by mouth daily.   atorvastatin 80 MG tablet Commonly known as: LIPITOR Take 1 tablet by mouth at bedtime.   diclofenac Sodium 1 % Gel Commonly known as: VOLTAREN Apply 1  application topically 4 (four) times daily as needed for pain.   ezetimibe 10 MG tablet Commonly known as: ZETIA Take 1 tablet (10 mg total) by mouth daily.   finasteride 5 MG tablet Commonly known as: PROSCAR Take 1 tablet (5 mg total) by mouth daily.   fluticasone 50 MCG/ACT nasal spray Commonly known as: FLONASE SHAKE LIQUID AND USE 2 SPRAYS IN EACH NOSTRIL EVERY DAY   Fluticasone-Salmeterol 250-50 MCG/DOSE Aepb Commonly known as: ADVAIR Inhale 1 puff into the lungs 2 (two) times daily. What changed:  when to take this reasons to take this   furosemide 20 MG tablet Commonly known as: LASIX Take 20 mg by mouth daily.   ipratropium-albuterol 0.5-2.5 (3) MG/3ML Soln Commonly known as: DUONEB Take 3 mLs by nebulization every 6 (six) hours as needed.    Jardiance 10 MG Tabs tablet Generic drug: empagliflozin Take by mouth.   ketoconazole 2 % cream Commonly known as: NIZORAL Apply 1 application topically daily as needed (fungus).   lisinopril 5 MG tablet Commonly known as: ZESTRIL Take 5 mg by mouth daily.   lisinopril 10 MG tablet Commonly known as: ZESTRIL Take 10 mg by mouth daily.   meclizine 25 MG tablet Commonly known as: ANTIVERT Take 1 tablet (25 mg total) by mouth 3 (three) times daily as needed for dizziness.   metFORMIN 500 MG 24 hr tablet Commonly known as: GLUCOPHAGE-XR Take 500 mg by mouth daily.   methocarbamol 500 MG tablet Commonly known as: ROBAXIN Take by mouth daily. As needed   metoprolol succinate 25 MG 24 hr tablet Commonly known as: TOPROL-XL Take 1 tablet by mouth daily.   nitroGLYCERIN 0.4 MG SL tablet Commonly known as: NITROSTAT Place 0.4 mg under the tongue every 5 (five) minutes as needed for chest pain.   OVER THE COUNTER MEDICATION Apply 1 application topically daily as needed (pain). Cbd cream   sertraline 100 MG tablet Commonly known as: ZOLOFT Take 1 tablet (100 mg total) by mouth daily.   Spiriva HandiHaler 18 MCG inhalation capsule Generic drug: tiotropium INHALE THE CONTENTS OF 1 CAPSULE VIA INHALATION DEVICE EVERY DAY What changed: See the new instructions.   spironolactone 25 MG tablet Commonly known as: ALDACTONE Take 25 mg by mouth daily.   tamsulosin 0.4 MG Caps capsule Commonly known as: FLOMAX Take by mouth.   traMADol 50 MG tablet Commonly known as: ULTRAM Take by mouth.   traMADol 50 MG tablet Commonly known as: ULTRAM Take by mouth.   traZODone 50 MG tablet Commonly known as: DESYREL Take 1 tablet by mouth at bedtime.        Allergies:  Allergies  Allergen Reactions   Contrast Media  [Iodinated Contrast Media] Nausea Only    GI upset    Aspirin Other (See Comments)    Thrombocytopenia (PER PT CAN TAKE LOW DOSE ASPIRIN)    Family  History: Family History  Problem Relation Age of Onset   Lung disease Mother    Cancer Father     Social History:  reports that he has quit smoking. He has never used smokeless tobacco. He reports that he does not drink alcohol and does not use drugs.   Physical Exam: BP (!) 155/78   Pulse 60   Ht '5\' 7"'$  (1.702 m)   Wt 124 lb (56.2 kg)   BMI 19.42 kg/m   Constitutional:  Alert and oriented, No acute distress. HEENT: [place] AT, moist mucus membranes.  Trachea midline, no masses. Neurologic: Grossly intact,  no focal deficits, moving all 4 extremities. Psychiatric: Normal mood and affect.   Assessment & Plan:    BPH with urinary frequency  - Continues to have obstructive and irritative urinary symptoms with elevated PVR results, slightly improved.  -  At this point concerned about starting him on anticholinergic or beta agonists due to concern for interval development of acute retention.  - Give the finasteride some additional time and then reassess. -continue flomax -continue conservative management but if still no improvement, may need to consider intervention  2. History of elevated PSA  - Checked at the time of active infection. Previously normal, non-elevated. Reassess at next follow up.  Return in about 4 months (around 06/03/2023) for PVR, IPSS, PSA.  I have reviewed the above documentation for accuracy and completeness, and I agree with the above.   Hollice Espy, MD   [facility] [address], [place], [place] [address] [phone]

## 2023-02-11 ENCOUNTER — Ambulatory Visit (INDEPENDENT_AMBULATORY_CARE_PROVIDER_SITE_OTHER): Payer: Medicare Other | Admitting: Vascular Surgery

## 2023-02-11 ENCOUNTER — Encounter (INDEPENDENT_AMBULATORY_CARE_PROVIDER_SITE_OTHER): Payer: Self-pay | Admitting: Vascular Surgery

## 2023-02-11 VITALS — BP 171/72 | HR 60 | Resp 16 | Wt 127.0 lb

## 2023-02-11 DIAGNOSIS — I10 Essential (primary) hypertension: Secondary | ICD-10-CM | POA: Diagnosis not present

## 2023-02-11 DIAGNOSIS — E7800 Pure hypercholesterolemia, unspecified: Secondary | ICD-10-CM | POA: Diagnosis not present

## 2023-02-11 DIAGNOSIS — I6523 Occlusion and stenosis of bilateral carotid arteries: Secondary | ICD-10-CM

## 2023-02-11 DIAGNOSIS — I6529 Occlusion and stenosis of unspecified carotid artery: Secondary | ICD-10-CM | POA: Insufficient documentation

## 2023-02-11 NOTE — Assessment & Plan Note (Signed)
lipid control important in reducing the progression of atherosclerotic disease. Continue statin therapy  

## 2023-02-11 NOTE — Progress Notes (Signed)
Patient ID: Mario Patrick, male   DOB: 05/08/1941, 82 y.o.   MRN: [id number]  Chief Complaint  Patient presents with   New Patient (Initial Visit)    Ref Drane consult carotid stenosis    HPI Mario Patrick is a 82 y.o. male.  I am asked to see the patient by Mario Patrick for evaluation of carotid artery disease.  She was seen him for his cardiac issues, and astutely noticed a bruit on physical exam.  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  Both vertebral arteries appear to be antegrade.  The patient and his family denies any previous history of stroke, TIA, or focal neurologic symptoms.  He had an MRI which did not show any obvious stroke.   Past Medical History:  Diagnosis Date   Anginal pain ([place])    Arthritis    Back ache    CHF (congestive heart failure) (HCC)    Chronic kidney disease    COPD (chronic obstructive pulmonary disease) ([place])    Coronary artery disease    Depression    Dyspnea    Hypercholesteremia    Hypertension    Myocardial infarction [facility])     Past Surgical History:  Procedure Laterality Date   AORTIC VALVE REPLACEMENT     BACK SURGERY     CARDIAC SURGERY     CABG X4   COLONOSCOPY     KYPHOPLASTY N/A 07/16/2021   Procedure: L1 KYPHOPLASTY;  Surgeon: Hessie Knows, MD;  Location: [facility] ORS;  Service: Orthopedics;  Laterality: N/A;     Family History  Problem Relation Age of Onset   Lung disease Mother    Cancer Father   No bleeding or clotting disorders   Social History   Tobacco Use   Smoking status: Former   Smokeless tobacco: Never  Scientific laboratory technician Use: Never used  Substance Use Topics   Alcohol use: No   Drug use: Never     Allergies  Allergen Reactions   Contrast Media  [Iodinated Contrast Media] Nausea Only    GI upset    Aspirin Other (See Comments)    Thrombocytopenia (PER PT CAN TAKE LOW DOSE  ASPIRIN)    Current Outpatient Medications  Medication Sig Dispense Refill   acetaminophen (TYLENOL) 500 MG tablet Take 500 mg by mouth every 6 (six) hours as needed for moderate pain.     albuterol (VENTOLIN HFA) 108 (90 Base) MCG/ACT inhaler Inhale 1-2 puffs into the lungs every 6 (six) hours as needed for wheezing or shortness of breath. (Patient taking differently: Inhale 2 puffs into the lungs every 6 (six) hours as needed for wheezing or shortness of breath.) 1 each 0   amLODipine (NORVASC) 5 MG tablet Take 5 mg by mouth daily.     atorvastatin (LIPITOR) 80 MG tablet Take 1 tablet (80 mg total) by mouth daily. 30 tablet 1   atorvastatin (LIPITOR) 80 MG tablet Take 1 tablet by mouth at bedtime.     diclofenac Sodium (VOLTAREN) 1 % GEL Apply 1 application topically 4 (four) times daily as needed for pain.     ezetimibe (ZETIA) 10 MG tablet Take 1 tablet (10 mg total) by mouth daily. 30 tablet 1   finasteride (PROSCAR) 5 MG tablet Take 1 tablet (5 mg total) by mouth daily. 90 tablet 3   fluticasone (FLONASE) 50 MCG/ACT nasal spray SHAKE LIQUID AND USE  2 SPRAYS IN EACH NOSTRIL EVERY DAY     Fluticasone-Salmeterol (ADVAIR) 250-50 MCG/DOSE AEPB Inhale 1 puff into the lungs 2 (two) times daily. (Patient taking differently: Inhale 1 puff into the lungs 2 (two) times daily as needed (shortness of breath).) 60 each 0   furosemide (LASIX) 20 MG tablet Take 20 mg by mouth daily.     ipratropium-albuterol (DUONEB) 0.5-2.5 (3) MG/3ML SOLN Take 3 mLs by nebulization every 6 (six) hours as needed. 360 mL 0   JARDIANCE 10 MG TABS tablet Take by mouth.     ketoconazole (NIZORAL) 2 % cream Apply 1 application topically daily as needed (fungus).     lisinopril (ZESTRIL) 20 MG tablet Take 20 mg by mouth daily.     lisinopril (ZESTRIL) 5 MG tablet Take 5 mg by mouth daily.     meclizine (ANTIVERT) 25 MG tablet Take 1 tablet (25 mg total) by mouth 3 (three) times daily as needed for dizziness. 30 tablet 0    metFORMIN (GLUCOPHAGE-XR) 500 MG 24 hr tablet Take 500 mg by mouth daily.     methocarbamol (ROBAXIN) 500 MG tablet Take by mouth daily. As needed     metoprolol succinate (TOPROL-XL) 25 MG 24 hr tablet Take 1 tablet by mouth daily.     nitroGLYCERIN (NITROSTAT) 0.4 MG SL tablet Place 0.4 mg under the tongue every 5 (five) minutes as needed for chest pain.     OVER THE COUNTER MEDICATION Apply 1 application topically daily as needed (pain). Cbd cream     sertraline (ZOLOFT) 100 MG tablet Take 1 tablet (100 mg total) by mouth daily. 30 tablet 0   SPIRIVA HANDIHALER 18 MCG inhalation capsule INHALE THE CONTENTS OF 1 CAPSULE VIA INHALATION DEVICE EVERY DAY (Patient taking differently: Place 18 mcg into inhaler and inhale daily as needed (shortness of breath).) 30 capsule 0   spironolactone (ALDACTONE) 25 MG tablet Take 25 mg by mouth daily.     tamsulosin (FLOMAX) 0.4 MG CAPS capsule Take by mouth.     traMADol (ULTRAM) 50 MG tablet Take by mouth.     traMADol (ULTRAM) 50 MG tablet Take by mouth.     traZODone (DESYREL) 50 MG tablet Take 1 tablet by mouth at bedtime.     aspirin EC 81 MG tablet Take 81 mg by mouth daily. Swallow whole.     No current facility-administered medications for this visit.      REVIEW OF SYSTEMS (Negative unless checked)  Constitutional: [] Weight loss  [] Fever  [] Chills Cardiac: [] Chest pain   [] Chest pressure   [] Palpitations   [] Shortness of breath when laying flat   [] Shortness of breath at rest   [] Shortness of breath with exertion. Vascular:  [] Pain in legs with walking   [] Pain in legs at rest   [] Pain in legs when laying flat   [] Claudication   [] Pain in feet when walking  [] Pain in feet at rest  [] Pain in feet when laying flat   [] History of DVT   [] Phlebitis   [] Swelling in legs   [] Varicose veins   [] Non-healing ulcers Pulmonary:   [] Uses home oxygen   [] Productive cough   [] Hemoptysis   [] Wheeze  [x] COPD   [] Asthma Neurologic:  [] Dizziness  [] Blackouts    [] Seizures   [] History of stroke   [] History of TIA  [] Aphasia   [] Temporary blindness   [] Dysphagia   [] Weakness or numbness in arms   [] Weakness or numbness in legs Musculoskeletal:  [x] Arthritis   [] Joint  swelling   [] Joint pain   [x] Low back pain Hematologic:  [] Easy bruising  [] Easy bleeding   [] Hypercoagulable state   [] Anemic  [] Hepatitis Gastrointestinal:  [] Blood in stool   [] Vomiting blood  [] Gastroesophageal reflux/heartburn   [] Abdominal pain Genitourinary:  [] Chronic kidney disease   [] Difficult urination  [] Frequent urination  [] Burning with urination   [] Hematuria Skin:  [] Rashes   [] Ulcers   [] Wounds Psychological:  [] History of anxiety   [x]  History of major depression.    Physical Exam BP (!) 171/72 (BP Location: Left Arm)   Pulse 60   Resp 16   Wt 127 lb (57.6 kg)   BMI 19.89 kg/m  Gen:  WD/WN, NAD Head: [place]/AT, No temporalis wasting.  Ear/Nose/Throat: Hearing grossly intact, nares w/o erythema or drainage, oropharynx w/o Erythema/Exudate Eyes: Conjunctiva clear, sclera non-icteric  Neck: trachea midline.  No JVD.  Left carotid bruit is present Pulmonary:  Good air movement, respirations not labored, no use of accessory muscles  Cardiac: Irregular Vascular:  Vessel Right Left  Radial Palpable Palpable                                   Gastrointestinal:. No masses, surgical incisions, or scars. Musculoskeletal: M/S 5/5 throughout.  Extremities without ischemic changes.  No deformity or atrophy. No edema. Neurologic: Sensation grossly intact in extremities.  Symmetrical.  Speech is fluent. Motor exam as listed above. Psychiatric: Judgment intact, Mood & affect appropriate for pt's clinical situation. Dermatologic: No rashes or ulcers noted.  No cellulitis or open wounds.    Radiology MR [place] [place] WO CONTRAST  Result Date: 01/28/2023 CLINICAL DATA:  Decreased hearing in the left ear EXAM: MRI HEAD WITHOUT AND WITH CONTRAST TECHNIQUE: Multiplanar,  multiecho pulse sequences of the brain and surrounding structures were obtained without and with intravenous contrast. CONTRAST:  31mL GADAVIST GADOBUTROL 1 MMOL/ML IV SOLN COMPARISON:  None Available. FINDINGS: Cranial Nerves VII and VIII: Normal bilaterally, without evidence of mass or abnormal enhancement. Cochleae: Normal bilaterally. Semicircular Canals: Normal bilaterally. Porus Acusticus: Normal bilaterally. Cerebellopontine Angle: Normal bilaterally, without evidence of mass. Brain: No restricted diffusion to suggest acute or subacute infarct. No abnormal parenchymal meningeal enhancement. No acute hemorrhage, mass, mass effect, or midline shift. No hydrocephalus or extra-axial collection. Normal pituitary and craniocervical junction. Mildly advanced cerebral volume loss for age, particularly affecting the medial frontal lobes bilaterally. 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 Remote lacunar infarct in the right corona radiata and cerebellum. T2 hyperintense signal in the periventricular white matter, likely the sequela of mild-to-moderate chronic small vessel ischemic disease. Vascular: Loss of the right petrous ICA flow void, with narrowing of the left petrous ICA flow void, although these are somewhat opacified with contrast, concerning for severe stenosis. Otherwise normal arterial flow voids. Normal venous enhancement. Skull and upper cervical spine: Normal marrow signal. Sinuses/Orbits: Clear paranasal sinuses. No acute finding in the orbits. Status post left lens replacement. Right globe prosthesis. Other: Fluid in the bilateral mastoid air cells. IMPRESSION: 1. No acute intracranial process. No etiology is seen for the patient's left-sided hearing loss. 2. Loss of the right petrous ICA flow void, with narrowing of the left petrous ICA flow void, although these are somewhat opacified with contrast, concerning for severe stenosis. This could be further  evaluated with a CTA of the head and neck Electronically Signed   By: Merilyn Baba  M.D.   On: 01/28/2023 01:30    Labs Recent Results (from the past 2160 hour(s))  Urinalysis, Complete     Status: Abnormal   Collection Time: 02/01/23  1:15 PM  Result Value Ref Range   Specific Gravity, UA 1.020 1.005 - 1.030   pH, UA 5.5 5.0 - 7.5   Color, UA Yellow Yellow   Appearance Ur Clear Clear   Leukocytes,UA Negative Negative   Protein,UA 2+ (A) Negative/Trace   Glucose, UA 3+ (A) Negative   Ketones, UA Negative Negative   RBC, UA Trace (A) Negative   Bilirubin, UA Negative Negative   Urobilinogen, Ur 0.2 0.2 - 1.0 mg/dL   Nitrite, UA Negative Negative   Microscopic Examination See below:   Microscopic Examination     Status: Abnormal   Collection Time: 02/01/23  1:15 PM   Urine  Result Value Ref Range   WBC, UA 0-5 0 - 5 /hpf   RBC, Urine 0-2 0 - 2 /hpf   Epithelial Cells (non renal) 0-10 0 - 10 /hpf   Casts Present (A) None seen /lpf   Cast Type Hyaline casts N/A   Mucus, UA Present (A) Not Estab.   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  Both vertebral arteries appear to be antegrade.  I had a long discussion with the patient and his family today.  He is already on Lipitor, aspirin, as well as his appropriate cardiopulmonary medications.  He has no symptoms.  I do think would be prudent to get a CT angiogram to confirm the occlusion this as well as to better evaluate the subclavian, vertebral, and external carotid arteries.  Likely no role for intervention with chronic occlusion.  Follow-up after the CT scan.  Hypertension blood pressure control important in reducing the progression of  atherosclerotic disease. On appropriate oral medications.   Hypercholesteremia lipid control important in reducing the progression of atherosclerotic disease. Continue statin therapy      Leotis Pain 02/11/2023, 12:17 PM   This note was created with Dragon medical transcription system.  Any errors from dictation are unintentional.

## 2023-02-11 NOTE — Assessment & Plan Note (Signed)
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  Both vertebral arteries appear to be antegrade.  I had a long discussion with the patient and his family today.  He is already on Lipitor, aspirin, as well as his appropriate cardiopulmonary medications.  He has no symptoms.  I do think would be prudent to get a CT angiogram to confirm the occlusion this as well as to better evaluate the subclavian, vertebral, and external carotid arteries.  Likely no role for intervention with chronic occlusion.  Follow-up after the CT scan.

## 2023-02-11 NOTE — Assessment & Plan Note (Signed)
blood pressure control important in reducing the progression of atherosclerotic disease. On appropriate oral medications.  

## 2023-02-22 ENCOUNTER — Telehealth (INDEPENDENT_AMBULATORY_CARE_PROVIDER_SITE_OTHER): Payer: Self-pay | Admitting: Vascular Surgery

## 2023-02-22 NOTE — Telephone Encounter (Signed)
LVM with both pt and daughter Ms. Heltzer (HIPAA [employer] wide). I advised of the no prior auth required for the CT ordered by Dr. Lucky Cowboy. I advised them to call radiology scheduling at [phone] and make the CT appt. I also advised for them to call back after the CT appt was made, to make an appt for CT results appt with Dr. Lucky Cowboy. Will await call from pt/daughter.

## 2023-03-03 ENCOUNTER — Telehealth (INDEPENDENT_AMBULATORY_CARE_PROVIDER_SITE_OTHER): Payer: Self-pay

## 2023-03-03 NOTE — Telephone Encounter (Signed)
Patient is schedule for CT on 03/04/2023. Contrast dye protocol has been called into pharmacy. I left a detailed message on patient voicemail.

## 2023-03-04 ENCOUNTER — Ambulatory Visit
Admission: RE | Admit: 2023-03-04 | Discharge: 2023-03-04 | Disposition: A | Discharge status: Home / Self Care | Payer: Medicare Other | Source: Ambulatory Visit | Attending: Vascular Surgery | Admitting: Vascular Surgery

## 2023-03-04 DIAGNOSIS — I6523 Occlusion and stenosis of bilateral carotid arteries: Secondary | ICD-10-CM

## 2023-03-24 ENCOUNTER — Ambulatory Visit: Payer: Medicare Other

## 2023-03-31 ENCOUNTER — Ambulatory Visit
Admission: RE | Admit: 2023-03-31 | Discharge: 2023-03-31 | Disposition: A | Discharge status: Home / Self Care | Payer: Medicare Other | Source: Ambulatory Visit | Attending: Vascular Surgery | Admitting: Vascular Surgery

## 2023-03-31 ENCOUNTER — Telehealth (INDEPENDENT_AMBULATORY_CARE_PROVIDER_SITE_OTHER): Payer: Self-pay | Admitting: Vascular Surgery

## 2023-03-31 DIAGNOSIS — N189 Chronic kidney disease, unspecified: Secondary | ICD-10-CM | POA: Diagnosis present

## 2023-03-31 LAB — POCT I-STAT CREATININE: Creatinine, Ser: 2.3 mg/dL — ABNORMAL HIGH (ref 0.61–1.24)

## 2023-03-31 MED ORDER — IOHEXOL 350 MG/ML SOLN: 75.0000 mL | Freq: Once | INTRAVENOUS | Status: DC | PRN

## 2023-03-31 NOTE — Telephone Encounter (Signed)
Per secure chat from Dr. Wyn Quaker - bring pt in for duplex scan and visit with him  Cconsult. can't get a CTA. we should have him come back in to discuss monitoring with duplex versus me performing a catheter based angiogram with limited contrast. See JD

## 2023-03-31 NOTE — Telephone Encounter (Signed)
LVM for pt TCB and schedule appt  carotid + consult. can't get a CTA. we should have him come back in to discuss monitoring with duplex versus me performing a catheter based angiogram with limited contrast. See JD

## 2023-04-01 ENCOUNTER — Other Ambulatory Visit (INDEPENDENT_AMBULATORY_CARE_PROVIDER_SITE_OTHER): Payer: Self-pay | Admitting: Vascular Surgery

## 2023-04-01 DIAGNOSIS — I779 Disorder of arteries and arterioles, unspecified: Secondary | ICD-10-CM

## 2023-04-04 ENCOUNTER — Ambulatory Visit (INDEPENDENT_AMBULATORY_CARE_PROVIDER_SITE_OTHER): Payer: Medicare Other

## 2023-04-04 DIAGNOSIS — I779 Disorder of arteries and arterioles, unspecified: Secondary | ICD-10-CM

## 2023-04-05 ENCOUNTER — Telehealth (INDEPENDENT_AMBULATORY_CARE_PROVIDER_SITE_OTHER): Payer: Self-pay

## 2023-04-05 ENCOUNTER — Encounter (INDEPENDENT_AMBULATORY_CARE_PROVIDER_SITE_OTHER): Payer: Self-pay

## 2023-04-05 ENCOUNTER — Ambulatory Visit (INDEPENDENT_AMBULATORY_CARE_PROVIDER_SITE_OTHER): Payer: Medicare Other | Admitting: Vascular Surgery

## 2023-04-07 NOTE — Telephone Encounter (Signed)
Please reschedule pt.

## 2023-04-24 ENCOUNTER — Emergency Department: Payer: Medicare Other

## 2023-04-24 ENCOUNTER — Emergency Department
Admission: EM | Admit: 2023-04-24 | Discharge: 2023-04-24 | Disposition: A | Discharge status: Home / Self Care | Payer: Medicare Other | Attending: Emergency Medicine | Admitting: Emergency Medicine

## 2023-04-24 ENCOUNTER — Other Ambulatory Visit: Payer: Self-pay

## 2023-04-24 DIAGNOSIS — K5732 Diverticulitis of large intestine without perforation or abscess without bleeding: Secondary | ICD-10-CM | POA: Insufficient documentation

## 2023-04-24 DIAGNOSIS — K5792 Diverticulitis of intestine, part unspecified, without perforation or abscess without bleeding: Secondary | ICD-10-CM

## 2023-04-24 DIAGNOSIS — R109 Unspecified abdominal pain: Secondary | ICD-10-CM | POA: Diagnosis present

## 2023-04-24 LAB — CBC WITH DIFFERENTIAL/PLATELET
Abs Immature Granulocytes: 0.02 10*3/uL (ref 0.00–0.07)
Basophils Absolute: 0 10*3/uL (ref 0.0–0.1)
Basophils Relative: 0 %
Eosinophils Absolute: 0.1 10*3/uL (ref 0.0–0.5)
Eosinophils Relative: 1 %
HCT: 39.6 % (ref 39.0–52.0)
Hemoglobin: 12.6 g/dL — ABNORMAL LOW (ref 13.0–17.0)
Immature Granulocytes: 0 %
Lymphocytes Relative: 10 %
Lymphs Abs: 0.8 10*3/uL (ref 0.7–4.0)
MCH: 28.5 pg (ref 26.0–34.0)
MCHC: 31.8 g/dL (ref 30.0–36.0)
MCV: 89.6 fL (ref 80.0–100.0)
Monocytes Absolute: 0.8 10*3/uL (ref 0.1–1.0)
Monocytes Relative: 10 %
Neutro Abs: 5.7 10*3/uL (ref 1.7–7.7)
Neutrophils Relative %: 79 %
Platelets: 71 10*3/uL — ABNORMAL LOW (ref 150–400)
RBC: 4.42 MIL/uL (ref 4.22–5.81)
RDW: 13.6 % (ref 11.5–15.5)
Smear Review: NORMAL
WBC: 7.2 10*3/uL (ref 4.0–10.5)
nRBC: 0 % (ref 0.0–0.2)

## 2023-04-24 LAB — URINALYSIS, ROUTINE W REFLEX MICROSCOPIC
Bilirubin Urine: NEGATIVE
Glucose, UA: 150 mg/dL — AB
Hgb urine dipstick: NEGATIVE
Ketones, ur: NEGATIVE mg/dL
Leukocytes,Ua: NEGATIVE
Nitrite: NEGATIVE
Protein, ur: 30 mg/dL — AB
Specific Gravity, Urine: 1.016 (ref 1.005–1.030)
pH: 5 (ref 5.0–8.0)

## 2023-04-24 LAB — COMPREHENSIVE METABOLIC PANEL
ALT: 18 U/L (ref 0–44)
AST: 20 U/L (ref 15–41)
Albumin: 3.8 g/dL (ref 3.5–5.0)
Alkaline Phosphatase: 68 U/L (ref 38–126)
Anion gap: 11 (ref 5–15)
BUN: 55 mg/dL — ABNORMAL HIGH (ref 8–23)
CO2: 26 mmol/L (ref 22–32)
Calcium: 8.7 mg/dL — ABNORMAL LOW (ref 8.9–10.3)
Chloride: 102 mmol/L (ref 98–111)
Creatinine, Ser: 2.35 mg/dL — ABNORMAL HIGH (ref 0.61–1.24)
GFR, Estimated: 27 mL/min — ABNORMAL LOW (ref >60)
Glucose, Bld: 91 mg/dL (ref 70–99)
Potassium: 4.9 mmol/L (ref 3.5–5.1)
Sodium: 139 mmol/L (ref 135–145)
Total Bilirubin: 0.8 mg/dL (ref 0.3–1.2)
Total Protein: 6.9 g/dL (ref 6.5–8.1)

## 2023-04-24 LAB — LIPASE, BLOOD: Lipase: 39 U/L (ref 11–51)

## 2023-04-24 MED ORDER — AMOXICILLIN-POT CLAVULANATE 875-125 MG PO TABS
1.0000 | ORAL_TABLET | Freq: Once | ORAL | Status: AC
  Administered 2023-04-24: 1 via ORAL
  Filled 2023-04-24: qty 1

## 2023-04-24 MED ORDER — AMOXICILLIN-POT CLAVULANATE 875-125 MG PO TABS: 1.0000 | ORAL_TABLET | Freq: Three times a day (TID) | ORAL | 0 refills | Status: AC

## 2023-04-24 MED ORDER — AMOXICILLIN-POT CLAVULANATE 875-125 MG PO TABS: 1.0000 | ORAL_TABLET | Freq: Three times a day (TID) | ORAL | 0 refills | Status: DC

## 2023-04-24 NOTE — Discharge Instructions (Addendum)
Take the antibiotics.  Try to do bowel rest with liquid diet and low fiber foods to initially start.  Return to ER if you develop worsening pain fevers or any other concerns.  Follow-up with his primary care doctor to discuss colonoscopy in 6 weeks.  Incidental findings on CT imaging noted and he can be followed up outpatient with PCP as well  IMPRESSION: 1. Acute diverticulitis of the distal descending colon. No evidence for perforation or abscess. 2. Left renal atrophy. 3. Nonobstructing right renal calculi. 4. Right solid pulmonary nodule measuring 5 mm. Per Fleischner Society Guidelines, no routine follow-up imaging is recommended. These guidelines do not apply to immunocompromised patients and patients with cancer. Follow up in patients with significant comorbidities as clinically warranted. For lung cancer screening, adhere to Lung-RADS guidelines. Reference: Radiology. 2017; 284(1):228-43. 5. Left Bosniak I benign renal cyst measuring 2.5 cm. No follow-up imaging is recommended. JACR 2018 Feb; 264-273, Management of the Incidental Renal Mass on CT, [employer] 2021; 814-848, Bosniak Classification of Cystic Renal Masses, Version 2019.

## 2023-04-24 NOTE — ED Provider Notes (Signed)
[facility] Provider Note    Event Date/Time   First MD Initiated Contact with Patient 04/24/23 1647     (approximate)   History   Constipation   HPI  Mario Patrick is a 82 y.o. male who comes in with concerns for abdominal pain.  Patient reports lower abdominal pain for the past few days intermittent small amounts of bowel movements and liquid but starting did not pass gas.  He has been talking with his primary care doctor who has had him on multiple different medications, suppositories without significant relief.  His primary doctor told him to come in and rule out diverticulitis or obstruction if he continue to have pain.  Denies any chest pain, shortness of breath, falls or other issues.   Physical Exam   Triage Vital Signs: ED Triage Vitals  Enc Vitals Group     BP 04/24/23 1403 100/74     Pulse Rate 04/24/23 1403 60     Resp 04/24/23 1403 14     Temp 04/24/23 1403 98.3 F (36.8 C)     Temp Source 04/24/23 1403 Oral     SpO2 04/24/23 1403 98 %     Weight 04/24/23 1400 120 lb (54.4 kg)     Height 04/24/23 1400 5\' 6"  (1.676 m)     Head Circumference --      Peak Flow --      Pain Score 04/24/23 1359 9     Pain Loc --      Pain Edu? --      Excl. in GC? --     Most recent vital signs: Vitals:   04/24/23 1403  BP: 100/74  Pulse: 60  Resp: 14  Temp: 98.3 F (36.8 C)  SpO2: 98%     General: Awake, no distress.  CV:  Good peripheral perfusion.  Resp:  Normal effort.  Abd:  No distention.  Patient tender in his lower abdomen without any rebound or guarding. Other:     ED Results / Procedures / Treatments   Labs (all labs ordered are listed, but only abnormal results are displayed) Labs Reviewed  CBC WITH DIFFERENTIAL/PLATELET  COMPREHENSIVE METABOLIC PANEL  LIPASE, BLOOD  URINALYSIS, ROUTINE W REFLEX MICROSCOPIC   I have reviewed the xray personally and interpreted no obvious stool burden  PROCEDURES:  Critical Care  performed: No  Procedures   MEDICATIONS ORDERED IN ED: Medications - No data to display   IMPRESSION / MDM / ASSESSMENT AND PLAN / ED COURSE  I reviewed the triage vital signs and the nursing notes.   Patient's presentation is most consistent with acute presentation with potential threat to life or bodily function.   Patient comes in with concern for lower abdominal discomfort decrease in bowel movements.  No recent changes to medications or prior history of this.  Given he is tender in his lower abdomen we discussed different options including CT imaging given the x-ray without evidence of constipation.  Patient would like to proceed with CT to rule out diverticulitis, perforation, obstruction.  Patient does have contrast allergy so they would like to go without contrast which I think is reasonable given his does not sound like an aneurysm rupture is going on for multiple days and patient is hemodynamically stable.  We discussed somewhat limitations without contrast but they would like to proceed without contrast due to issues with his kidneys as well as allergy.  UA without evidence of UTI.  CMP shows creatinine around  baseline at 2.35.  Lipase normal  IMPRESSION: 1. Acute diverticulitis of the distal descending colon. No evidence for perforation or abscess. 2. Left renal atrophy. 3. Nonobstructing right renal calculi. 4. Right solid pulmonary nodule measuring 5 mm. Per Fleischner Society Guidelines, no routine follow-up imaging is recommended. These guidelines do not apply to immunocompromised patients and patients with cancer. Follow up in patients with significant comorbidities as clinically warranted. For lung cancer screening, adhere to Lung-RADS guidelines. Reference: Radiology. 2017; 284(1):228-43. 5. Left Bosniak I benign renal cyst measuring 2.5 cm. No follow-up imaging is recommended. JACR 2018 Feb; 264-273, Management of the Incidental Renal Mass on CT, [employer]  2021; 814-848, Bosniak Classification of Cystic Renal Masses, Version 2019.   Discussed with patient diverticulitis being positive.  We discussed treatment with liquid diet and bowel rest and then antibiotics given continued symptoms.  Reevaluated patient and recommended colonoscopy in 6 weeks expressed understanding.  We discussed pros and cons of discharge patient well-appearing normal vital signs labs around baseline he feels comfortable with discharge at this time tolerating p.o. he does have some constipation noted on CT imaging so we discussed some MiraLAX as needed to help with this as well  FINAL CLINICAL IMPRESSION(S) / ED DIAGNOSES   Final diagnoses:  Diverticulitis     Rx / DC Orders   ED Discharge Orders          Ordered    amoxicillin-clavulanate (AUGMENTIN) 875-125 MG tablet  3 times daily,   Status:  Discontinued        04/24/23 1903    amoxicillin-clavulanate (AUGMENTIN) 875-125 MG tablet  Every 8 hours        04/24/23 1905             Note:  This document was prepared using Dragon voice recognition software and may include unintentional dictation errors.   [name], MD 04/24/23 Ebony Cargo

## 2023-04-24 NOTE — ED Triage Notes (Signed)
Pt to ED with daughter in law vca POV for constipation and intermittent bloating and abdominal pain since Wednesday. LBM Tuesday (5 days ago). Pt rates pain 9/10 to lower abdomen. Denies urinary symptoms. Pt in NAD.

## 2023-06-01 ENCOUNTER — Telehealth: Payer: TRICARE For Life (TFL) | Admitting: Urology

## 2023-06-27 ENCOUNTER — Encounter: Payer: Self-pay | Admitting: Urology

## 2023-06-28 ENCOUNTER — Ambulatory Visit: Payer: Medicare Other | Admitting: Urology

## 2023-06-28 VITALS — BP 99/62 | HR 57 | Ht 66.0 in | Wt 121.0 lb

## 2023-06-28 DIAGNOSIS — Z87440 Personal history of urinary (tract) infections: Secondary | ICD-10-CM

## 2023-06-28 DIAGNOSIS — R338 Other retention of urine: Secondary | ICD-10-CM

## 2023-06-28 DIAGNOSIS — R972 Elevated prostate specific antigen [PSA]: Secondary | ICD-10-CM | POA: Diagnosis not present

## 2023-06-28 DIAGNOSIS — N401 Enlarged prostate with lower urinary tract symptoms: Secondary | ICD-10-CM

## 2023-06-28 DIAGNOSIS — N390 Urinary tract infection, site not specified: Secondary | ICD-10-CM

## 2023-06-28 DIAGNOSIS — R350 Frequency of micturition: Secondary | ICD-10-CM | POA: Diagnosis not present

## 2023-06-28 LAB — BLADDER SCAN AMB NON-IMAGING: Scan Result: 215

## 2023-06-28 NOTE — Progress Notes (Signed)
I, Mario Patrick,acting as a scribe for Vanna Scotland, MD.,have documented all relevant documentation on the behalf of Vanna Scotland, MD,as directed by  Vanna Scotland, MD while in the presence of Vanna Scotland, MD.   06/28/23 4:19 PM   Mario Patrick Dec 20, 1940 [id number]  Referring provider: Rayetta Humphrey, MD [address] [place],  [place] [address]  Chief Complaint Patient presents with  Benign Prostatic Hypertrophy  Elevated PSA   HPI: 82 year-old male with a personal history of BPH, incomplete bladder emptying, recurrent UTI's who returns today for 6 month follow-up.  He's now managed on Flomax 0.8 mg as well as finasteride. At the last visit his PVR was elevated in the 200 range.  He is due for a PSA test, as his PSA was previously elevated to 7.1 in December 2023, during a period when he was experiencing infections.  His daughter-in-law reported that he has had no issues with recurrent urinary tract infections in the past six months and has experienced less urinary frequency on his current BPH medications.    Results for orders placed or performed in visit on 06/28/23 Bladder Scan (Post Void Residual) in office Result Value Ref Range Scan Result 215 ml  IPSS  Row Name 06/28/23 1000 International Prostate Symptom Score How often have you had the sensation of not emptying your bladder? Almost always How often have you had to urinate less than every two hours? Less than half the time How often have you found you stopped and started again several times when you urinated? Not at All How often have you found it difficult to postpone urination? Not at All How often have you had a weak urinary stream? About half the time How often have you had to strain to start urination? Not at All How many times did you typically get up at night to urinate? 3 Times Total IPSS Score 13 Quality of Life due to urinary symptoms If you were to spend the rest of  your life with your urinary condition just the way it is now how would you feel about that? Unhappy    Score:  1-7 Mild 8-19 Moderate 20-35 Severe   PMH: Past Medical History: Diagnosis Date  Anginal pain (HCC)  Arthritis  Back ache  CHF (congestive heart failure) (HCC)  Chronic kidney disease stage 3  COPD (chronic obstructive pulmonary disease) (HCC)  Coronary artery disease recently checked carotid arteries, significant blockage  Depression  Dyspnea  Hypercholesteremia  Hypertension  Myocardial infarction [facility])   Surgical History: Past Surgical History: Procedure Laterality Date  AORTIC VALVE REPLACEMENT  BACK SURGERY  CARDIAC SURGERY CABG X4  COLONOSCOPY  KYPHOPLASTY N/A 07/16/2021 Procedure: L1 KYPHOPLASTY;  Surgeon: Kennedy Bucker, MD;  Location: [facility] ORS;  Service: Orthopedics;  Laterality: N/A;   Home Medications:  Allergies as of 06/28/2023     Reactions Contrast Media  [iodinated Contrast Media] Nausea Only GI upset Aspirin Other (See Comments) Thrombocytopenia (PER PT CAN TAKE LOW DOSE ASPIRIN)   Medication List   Accurate as of June 28, 2023  4:19 PM. If you have any questions, ask your nurse or doctor.   acetaminophen 500 MG tablet Commonly known as: TYLENOL Take 500 mg by mouth every 6 (six) hours as needed for moderate pain. albuterol 108 (90 Base) MCG/ACT inhaler Commonly known as: VENTOLIN HFA Inhale 1-2 puffs into the lungs every 6 (six) hours as needed for wheezing or shortness of breath. What changed: how much to take amLODipine 5  MG tablet Commonly known as: NORVASC Take 5 mg by mouth daily. aspirin EC 81 MG tablet Take 81 mg by mouth daily. Swallow whole. atorvastatin 80 MG tablet Commonly known as: LIPITOR Take 1 tablet by mouth at bedtime. diclofenac Sodium 1 % Gel Commonly known as: VOLTAREN Apply 1 application topically 4 (four) times daily as needed for pain. ezetimibe 10 MG tablet Commonly  known as: ZETIA Take 1 tablet (10 mg total) by mouth daily. finasteride 5 MG tablet Commonly known as: PROSCAR Take 1 tablet (5 mg total) by mouth daily. fluticasone 50 MCG/ACT nasal spray Commonly known as: FLONASE SHAKE LIQUID AND USE 2 SPRAYS IN EACH NOSTRIL EVERY DAY Fluticasone-Salmeterol 250-50 MCG/DOSE Aepb Commonly known as: ADVAIR Inhale 1 puff into the lungs 2 (two) times daily. What changed:  when to take this reasons to take this furosemide 20 MG tablet Commonly known as: LASIX Take 20 mg by mouth daily. ipratropium-albuterol 0.5-2.5 (3) MG/3ML Soln Commonly known as: DUONEB Take 3 mLs by nebulization every 6 (six) hours as needed. Jardiance 10 MG Tabs tablet Generic drug: empagliflozin Take by mouth. ketoconazole 2 % cream Commonly known as: NIZORAL Apply 1 application topically daily as needed (fungus). lisinopril 5 MG tablet Commonly known as: ZESTRIL Take 5 mg by mouth daily. lisinopril 20 MG tablet Commonly known as: ZESTRIL Take 20 mg by mouth daily. meclizine 25 MG tablet Commonly known as: ANTIVERT Take 1 tablet (25 mg total) by mouth 3 (three) times daily as needed for dizziness. metFORMIN 500 MG 24 hr tablet Commonly known as: GLUCOPHAGE-XR Take 500 mg by mouth daily. methocarbamol 500 MG tablet Commonly known as: ROBAXIN Take by mouth daily. As needed metoprolol succinate 25 MG 24 hr tablet Commonly known as: TOPROL-XL Take 1 tablet by mouth daily. mirtazapine 7.5 MG tablet Commonly known as: REMERON Take 7.5 mg by mouth at bedtime. nitroGLYCERIN 0.4 MG SL tablet Commonly known as: NITROSTAT Place 0.4 mg under the tongue every 5 (five) minutes as needed for chest pain. OVER THE COUNTER MEDICATION Apply 1 application topically daily as needed (pain). Cbd cream sertraline 100 MG tablet Commonly known as: ZOLOFT Take 1 tablet (100 mg total) by mouth daily. Spiriva HandiHaler 18 MCG inhalation capsule Generic drug: tiotropium INHALE THE  CONTENTS OF 1 CAPSULE VIA INHALATION DEVICE EVERY DAY What changed: See the new instructions. spironolactone 25 MG tablet Commonly known as: ALDACTONE Take 25 mg by mouth daily. tamsulosin 0.4 MG Caps capsule Commonly known as: FLOMAX Take by mouth. traMADol 50 MG tablet Commonly known as: ULTRAM Take by mouth. What changed: Another medication with the same name was removed. Continue taking this medication, and follow the directions you see here. traZODone 50 MG tablet Commonly known as: DESYREL Take 1 tablet by mouth at bedtime.    Allergies:  Allergies Allergen Reactions  Contrast Media  [Iodinated Contrast Media] Nausea Only GI upset  Aspirin Other (See Comments) Thrombocytopenia (PER PT CAN TAKE LOW DOSE ASPIRIN)   Family History: Family History Problem Relation Age of Onset  Lung disease Mother  Cancer Father   Social History:  reports that he has quit smoking. He has never used smokeless tobacco. He reports that he does not drink alcohol and does not use drugs.   Physical Exam: BP 99/62   Pulse (!) 57   Ht 5\' 6"  (1.676 m)   Wt 121 lb (54.9 kg)   BMI 19.53 kg/m   Constitutional:  Alert and oriented, No acute distress. HEENT: [place] AT, moist mucus  membranes.  Trachea midline, no masses. Neurologic: Grossly intact, no focal deficits, moving all 4 extremities. Psychiatric: Normal mood and affect.   Assessment & Plan:    BPH with incomplete emptying -Continue Finasteride and Flomax 0.8 mg -PVR remains elevated but stably so, minimally symptomatic  2. Recurrent UTI's -Has not had an issue over the past six months -If he starts having issues again, we'll reassess the need for outlet procedure  3. Elevated PSA -PSA was elevated to 7.1 in December 2023 during an infection. -Repeat PSA today to ensure it is coming back down.  Return in about 1 year (around 06/27/2024) for IPSS, PVR.  I have reviewed the above documentation for accuracy and  completeness, and I agree with the above.   Vanna Scotland, MD    [facility] [address], [address] [place], [place] [address] [phone]

## 2024-01-04 ENCOUNTER — Emergency Department: Payer: Medicare Other

## 2024-01-04 ENCOUNTER — Observation Stay
Admission: EM | Admit: 2024-01-04 | Discharge: 2024-01-05 | Disposition: A | Discharge status: Home / Self Care | Payer: Medicare Other | Attending: Internal Medicine | Admitting: Internal Medicine

## 2024-01-04 ENCOUNTER — Other Ambulatory Visit: Payer: Self-pay

## 2024-01-04 DIAGNOSIS — E1122 Type 2 diabetes mellitus with diabetic chronic kidney disease: Secondary | ICD-10-CM | POA: Insufficient documentation

## 2024-01-04 DIAGNOSIS — J449 Chronic obstructive pulmonary disease, unspecified: Secondary | ICD-10-CM | POA: Diagnosis present

## 2024-01-04 DIAGNOSIS — N1831 Chronic kidney disease, stage 3a: Secondary | ICD-10-CM | POA: Insufficient documentation

## 2024-01-04 DIAGNOSIS — I10 Essential (primary) hypertension: Secondary | ICD-10-CM | POA: Diagnosis not present

## 2024-01-04 DIAGNOSIS — F32A Depression, unspecified: Secondary | ICD-10-CM | POA: Insufficient documentation

## 2024-01-04 DIAGNOSIS — I2510 Atherosclerotic heart disease of native coronary artery without angina pectoris: Secondary | ICD-10-CM | POA: Diagnosis not present

## 2024-01-04 DIAGNOSIS — N1832 Chronic kidney disease, stage 3b: Secondary | ICD-10-CM

## 2024-01-04 DIAGNOSIS — I130 Hypertensive heart and chronic kidney disease with heart failure and stage 1 through stage 4 chronic kidney disease, or unspecified chronic kidney disease: Secondary | ICD-10-CM | POA: Diagnosis not present

## 2024-01-04 DIAGNOSIS — Z7982 Long term (current) use of aspirin: Secondary | ICD-10-CM | POA: Insufficient documentation

## 2024-01-04 DIAGNOSIS — I5A Non-ischemic myocardial injury (non-traumatic): Secondary | ICD-10-CM

## 2024-01-04 DIAGNOSIS — E1129 Type 2 diabetes mellitus with other diabetic kidney complication: Secondary | ICD-10-CM | POA: Diagnosis present

## 2024-01-04 DIAGNOSIS — Z79899 Other long term (current) drug therapy: Secondary | ICD-10-CM | POA: Diagnosis not present

## 2024-01-04 DIAGNOSIS — D696 Thrombocytopenia, unspecified: Secondary | ICD-10-CM

## 2024-01-04 DIAGNOSIS — I5033 Acute on chronic diastolic (congestive) heart failure: Principal | ICD-10-CM | POA: Diagnosis present

## 2024-01-04 DIAGNOSIS — K219 Gastro-esophageal reflux disease without esophagitis: Secondary | ICD-10-CM | POA: Diagnosis present

## 2024-01-04 DIAGNOSIS — E7800 Pure hypercholesterolemia, unspecified: Secondary | ICD-10-CM

## 2024-01-04 DIAGNOSIS — Z87891 Personal history of nicotine dependence: Secondary | ICD-10-CM | POA: Diagnosis not present

## 2024-01-04 DIAGNOSIS — R0602 Shortness of breath: Secondary | ICD-10-CM | POA: Diagnosis present

## 2024-01-04 DIAGNOSIS — Z7984 Long term (current) use of oral hypoglycemic drugs: Secondary | ICD-10-CM | POA: Diagnosis not present

## 2024-01-04 DIAGNOSIS — I509 Heart failure, unspecified: Principal | ICD-10-CM

## 2024-01-04 DIAGNOSIS — Z951 Presence of aortocoronary bypass graft: Secondary | ICD-10-CM | POA: Diagnosis not present

## 2024-01-04 LAB — TROPONIN I (HIGH SENSITIVITY)
Troponin I (High Sensitivity): 57 ng/L — ABNORMAL HIGH (ref <18)
Troponin I (High Sensitivity): 56 ng/L — ABNORMAL HIGH (ref <18)

## 2024-01-04 LAB — COMPREHENSIVE METABOLIC PANEL
ALT: 30 U/L (ref 0–44)
AST: 27 U/L (ref 15–41)
Albumin: 3.9 g/dL (ref 3.5–5.0)
Alkaline Phosphatase: 91 U/L (ref 38–126)
Anion gap: 8 (ref 5–15)
BUN: 28 mg/dL — ABNORMAL HIGH (ref 8–23)
CO2: 25 mmol/L (ref 22–32)
Calcium: 9 mg/dL (ref 8.9–10.3)
Chloride: 108 mmol/L (ref 98–111)
Creatinine, Ser: 1.98 mg/dL — ABNORMAL HIGH (ref 0.61–1.24)
GFR, Estimated: 33 mL/min — ABNORMAL LOW (ref >60)
Glucose, Bld: 107 mg/dL — ABNORMAL HIGH (ref 70–99)
Potassium: 4.1 mmol/L (ref 3.5–5.1)
Sodium: 141 mmol/L (ref 135–145)
Total Bilirubin: 1.1 mg/dL (ref 0.0–1.2)
Total Protein: 7 g/dL (ref 6.5–8.1)

## 2024-01-04 LAB — URINALYSIS, W/ REFLEX TO CULTURE (INFECTION SUSPECTED)
Bilirubin Urine: NEGATIVE
Glucose, UA: 150 mg/dL — AB
Ketones, ur: NEGATIVE mg/dL
Nitrite: NEGATIVE
Protein, ur: 30 mg/dL — AB
Specific Gravity, Urine: 1.005 (ref 1.005–1.030)
Squamous Epithelial / HPF: 0 /[HPF] (ref 0–5)
pH: 6 (ref 5.0–8.0)

## 2024-01-04 LAB — CBC
HCT: 42.8 % (ref 39.0–52.0)
Hemoglobin: 13.3 g/dL (ref 13.0–17.0)
MCH: 28.8 pg (ref 26.0–34.0)
MCHC: 31.1 g/dL (ref 30.0–36.0)
MCV: 92.6 fL (ref 80.0–100.0)
Platelets: 63 10*3/uL — ABNORMAL LOW (ref 150–400)
RBC: 4.62 MIL/uL (ref 4.22–5.81)
RDW: 14.9 % (ref 11.5–15.5)
WBC: 6.6 10*3/uL (ref 4.0–10.5)
nRBC: 0 % (ref 0.0–0.2)

## 2024-01-04 LAB — TSH: TSH: 0.824 u[IU]/mL (ref 0.350–4.500)

## 2024-01-04 LAB — CBG MONITORING, ED: Glucose-Capillary: 90 mg/dL (ref 70–99)

## 2024-01-04 LAB — BRAIN NATRIURETIC PEPTIDE: B Natriuretic Peptide: 1437.9 pg/mL — ABNORMAL HIGH (ref 0.0–100.0)

## 2024-01-04 LAB — RESP PANEL BY RT-PCR (RSV, FLU A&B, COVID)  RVPGX2
Influenza A by PCR: NEGATIVE
Influenza B by PCR: NEGATIVE
Resp Syncytial Virus by PCR: NEGATIVE
SARS Coronavirus 2 by RT PCR: NEGATIVE

## 2024-01-04 LAB — T4, FREE: Free T4: 0.98 ng/dL (ref 0.61–1.12)

## 2024-01-04 MED ORDER — SERTRALINE HCL 50 MG PO TABS
150.0000 mg | ORAL_TABLET | Freq: Every day | ORAL | Status: DC
  Administered 2024-01-05: 150 mg via ORAL
  Filled 2024-01-04: qty 3

## 2024-01-04 MED ORDER — FUROSEMIDE 10 MG/ML IJ SOLN
40.0000 mg | Freq: Two times a day (BID) | INTRAMUSCULAR | Status: DC
  Administered 2024-01-05: 40 mg via INTRAVENOUS
  Filled 2024-01-04: qty 4

## 2024-01-04 MED ORDER — ASPIRIN 81 MG PO TBEC
81.0000 mg | DELAYED_RELEASE_TABLET | Freq: Every day | ORAL | Status: DC
  Administered 2024-01-04 – 2024-01-05 (×2): 81 mg via ORAL
  Filled 2024-01-04 (×2): qty 1

## 2024-01-04 MED ORDER — EZETIMIBE 10 MG PO TABS
10.0000 mg | ORAL_TABLET | Freq: Every day | ORAL | Status: DC
  Administered 2024-01-05: 10 mg via ORAL
  Filled 2024-01-04: qty 1

## 2024-01-04 MED ORDER — HYDRALAZINE HCL 20 MG/ML IJ SOLN: 5.0000 mg | INTRAMUSCULAR | Status: DC | PRN

## 2024-01-04 MED ORDER — IPRATROPIUM-ALBUTEROL 0.5-2.5 (3) MG/3ML IN SOLN
3.0000 mL | RESPIRATORY_TRACT | Status: DC
  Administered 2024-01-04 – 2024-01-05 (×4): 3 mL via RESPIRATORY_TRACT
  Filled 2024-01-04 (×4): qty 3

## 2024-01-04 MED ORDER — FLUTICASONE PROPIONATE 50 MCG/ACT NA SUSP
2.0000 | Freq: Every day | NASAL | Status: DC
  Administered 2024-01-05: 2 via NASAL
  Filled 2024-01-04: qty 16

## 2024-01-04 MED ORDER — MOMETASONE FURO-FORMOTEROL FUM 200-5 MCG/ACT IN AERO
2.0000 | INHALATION_SPRAY | Freq: Two times a day (BID) | RESPIRATORY_TRACT | Status: DC
  Administered 2024-01-04 – 2024-01-05 (×2): 2 via RESPIRATORY_TRACT
  Filled 2024-01-04: qty 8.8

## 2024-01-04 MED ORDER — FINASTERIDE 5 MG PO TABS
5.0000 mg | ORAL_TABLET | Freq: Every day | ORAL | Status: DC
  Administered 2024-01-05: 5 mg via ORAL
  Filled 2024-01-04: qty 1

## 2024-01-04 MED ORDER — METHOCARBAMOL 500 MG PO TABS: 500.0000 mg | ORAL_TABLET | Freq: Three times a day (TID) | ORAL | Status: DC | PRN

## 2024-01-04 MED ORDER — TAMSULOSIN HCL 0.4 MG PO CAPS
0.4000 mg | ORAL_CAPSULE | Freq: Every day | ORAL | Status: DC
  Administered 2024-01-05: 0.4 mg via ORAL
  Filled 2024-01-04: qty 1

## 2024-01-04 MED ORDER — ALUM & MAG HYDROXIDE-SIMETH 200-200-20 MG/5ML PO SUSP: 30.0000 mL | Freq: Four times a day (QID) | ORAL | Status: DC | PRN

## 2024-01-04 MED ORDER — DM-GUAIFENESIN ER 30-600 MG PO TB12
1.0000 | ORAL_TABLET | Freq: Two times a day (BID) | ORAL | Status: DC | PRN
Start: 2024-01-04 — End: 2024-01-05

## 2024-01-04 MED ORDER — METOPROLOL SUCCINATE ER 25 MG PO TB24
12.5000 mg | ORAL_TABLET | Freq: Every day | ORAL | Status: DC
  Administered 2024-01-05: 12.5 mg via ORAL
  Filled 2024-01-04: qty 1

## 2024-01-04 MED ORDER — TRAZODONE HCL 100 MG PO TABS
100.0000 mg | ORAL_TABLET | Freq: Every evening | ORAL | Status: DC | PRN
  Administered 2024-01-04: 100 mg via ORAL
  Filled 2024-01-04: qty 1

## 2024-01-04 MED ORDER — NITROGLYCERIN 0.4 MG SL SUBL: 0.4000 mg | SUBLINGUAL_TABLET | SUBLINGUAL | Status: DC | PRN

## 2024-01-04 MED ORDER — ATORVASTATIN CALCIUM 20 MG PO TABS
80.0000 mg | ORAL_TABLET | Freq: Every day | ORAL | Status: DC
  Administered 2024-01-04: 80 mg via ORAL
  Filled 2024-01-04: qty 4

## 2024-01-04 MED ORDER — MIRTAZAPINE 15 MG PO TABS
7.5000 mg | ORAL_TABLET | Freq: Every day | ORAL | Status: DC
  Administered 2024-01-04: 7.5 mg via ORAL
  Filled 2024-01-04: qty 1

## 2024-01-04 MED ORDER — ACETAMINOPHEN 325 MG PO TABS
650.0000 mg | ORAL_TABLET | Freq: Four times a day (QID) | ORAL | Status: DC | PRN
  Administered 2024-01-05: 650 mg via ORAL
  Filled 2024-01-04: qty 2

## 2024-01-04 MED ORDER — ONDANSETRON HCL 4 MG/2ML IJ SOLN: 4.0000 mg | Freq: Three times a day (TID) | INTRAMUSCULAR | Status: DC | PRN

## 2024-01-04 MED ORDER — INSULIN ASPART 100 UNIT/ML IJ SOLN
0.0000 [IU] | Freq: Three times a day (TID) | INTRAMUSCULAR | Status: DC
  Administered 2024-01-05: 3 [IU] via SUBCUTANEOUS
  Filled 2024-01-04: qty 1

## 2024-01-04 MED ORDER — TRAMADOL HCL 50 MG PO TABS: 50.0000 mg | ORAL_TABLET | Freq: Four times a day (QID) | ORAL | Status: DC | PRN

## 2024-01-04 MED ORDER — ALBUTEROL SULFATE (2.5 MG/3ML) 0.083% IN NEBU: 2.5000 mg | INHALATION_SOLUTION | RESPIRATORY_TRACT | Status: DC | PRN

## 2024-01-04 MED ORDER — INSULIN ASPART 100 UNIT/ML IJ SOLN: 0.0000 [IU] | Freq: Every day | INTRAMUSCULAR | Status: DC

## 2024-01-04 MED ORDER — PANTOPRAZOLE SODIUM 40 MG PO TBEC
40.0000 mg | DELAYED_RELEASE_TABLET | Freq: Every day | ORAL | Status: DC
  Administered 2024-01-04 – 2024-01-05 (×2): 40 mg via ORAL
  Filled 2024-01-04 (×2): qty 1

## 2024-01-04 MED ORDER — FUROSEMIDE 10 MG/ML IJ SOLN
40.0000 mg | Freq: Once | INTRAMUSCULAR | Status: AC
  Administered 2024-01-04: 40 mg via INTRAVENOUS
  Filled 2024-01-04: qty 4

## 2024-01-04 NOTE — ED Notes (Signed)
Daughter Gavin Pound informed this NT she would like to be called with any changes regarding the pt.  Deborah's # [phone]

## 2024-01-04 NOTE — ED Triage Notes (Signed)
Pt here with SOB x2 weeks. Pt denies CP. Pt sent from his primary for a HF workup. Pt has ST elevation in V2 and V3. Pt also having crackles and wheezing. Pt has hx of COPD and HF.

## 2024-01-04 NOTE — H&P (Signed)
History and Physical    Mario Patrick XLK:[id number] DOB: 1941-02-11 DOA: 01/04/2024  Referring MD/NP/PA:   PCP: Rayetta Humphrey, MD   Patient coming from:  The patient is coming from home.     Chief Complaint: SOB  HPI: Mario Patrick is a 83 y.o. male with medical history significant of the CHF, HTH, HLD, DM, COPD, CAD, CABG, depression, BPH, CKD-3B, thrombocytopenia, who presents with shortness of breath.  Patient states that he has shortness of breath for almost 2 weeks, which has been progressively worsening.  Patient has cough with little mucus production.  No chest pain, fever or chills.  No nausea, vomiting, diarrhea or abdominal pain.  No symptoms of UTI.  Patient reports acid reflux symptoms.   Data reviewed independently and ED Course: pt was found to have BNP 1437, troponin 57 --> 56, WBC 6.6, negative PCR for COVID, flu and RSV, stable renal function, temperature normal, blood pressure 175/99, heart rate 61, RR 23, oxygen saturation 98% on room air.  Chest x-ray negative.  Patient is placed on telemetry bed for observation.   EKG: I have personally reviewed.  Sinus rhythm, QTc 436, T wave inversion in lateral leads, V4-V6 and lead II.   Review of Systems:   General: no fevers, chills, no body weight gain, has fatigue HEENT: no blurry vision, hearing changes or sore throat Respiratory: has dyspnea, coughing, no wheezing CV: no chest pain, no palpitations GI: no nausea, vomiting, abdominal pain, diarrhea, constipation GU: no dysuria, burning on urination, increased urinary frequency, hematuria  Ext: has trace leg edema Neuro: no unilateral weakness, numbness, or tingling, no vision change or hearing loss Skin: no rash, no skin tear. MSK: No muscle spasm, no deformity, no limitation of range of movement in spin Heme: No easy bruising.  Travel history: No recent long distant travel.   Allergy:  Allergies  Allergen Reactions   Contrast Media  [Iodinated  Contrast Media] Nausea Only    GI upset    Aspirin Other (See Comments)    Thrombocytopenia (PER PT CAN TAKE LOW DOSE ASPIRIN)    Past Medical History:  Diagnosis Date   Anginal pain (HCC)    Arthritis    Back ache    CHF (congestive heart failure) (HCC)    Chronic kidney disease    stage 3   COPD (chronic obstructive pulmonary disease) (HCC)    Coronary artery disease    recently checked carotid arteries, significant blockage   Depression    Dyspnea    Hypercholesteremia    Hypertension    Myocardial infarction [facility])     Past Surgical History:  Procedure Laterality Date   AORTIC VALVE REPLACEMENT     BACK SURGERY     CARDIAC SURGERY     CABG X4   COLONOSCOPY     KYPHOPLASTY N/A 07/16/2021   Procedure: L1 KYPHOPLASTY;  Surgeon: Kennedy Bucker, MD;  Location: [facility] ORS;  Service: Orthopedics;  Laterality: N/A;    Social History:  reports that he has quit smoking. He has never used smokeless tobacco. He reports that he does not drink alcohol and does not use drugs.  Family History:  Family History  Problem Relation Age of Onset   Lung disease Mother    Cancer Father      Prior to Admission medications   Medication Sig Start Date End Date Taking? Authorizing Provider  acetaminophen (TYLENOL) 500 MG tablet Take 500 mg by mouth every 6 (six) hours as needed for  moderate pain.   Yes [provider]  albuterol (VENTOLIN HFA) 108 (90 Base) MCG/ACT inhaler Inhale 1-2 puffs into the lungs every 6 (six) hours as needed for wheezing or shortness of breath. Patient taking differently: Inhale 2 puffs into the lungs every 6 (six) hours as needed for wheezing or shortness of breath. 03/06/21  Yes Delton See, MD  atorvastatin (LIPITOR) 80 MG tablet Take 1 tablet by mouth at bedtime. 12/22/22 01/04/24 Yes [provider]  ezetimibe (ZETIA) 10 MG tablet Take 1 tablet (10 mg total) by mouth daily. 10/01/20  Yes Pahwani, Rinka R, MD  finasteride (PROSCAR) 5 MG tablet  Take 1 tablet (5 mg total) by mouth daily. 11/02/22  Yes Vanna Scotland, MD  fluticasone (FLONASE) 50 MCG/ACT nasal spray SHAKE LIQUID AND USE 2 SPRAYS IN EACH NOSTRIL EVERY DAY 04/20/22  Yes [provider]  Fluticasone-Salmeterol (ADVAIR) 250-50 MCG/DOSE AEPB Inhale 1 puff into the lungs 2 (two) times daily. Patient taking differently: Inhale 1 puff into the lungs 2 (two) times daily as needed (shortness of breath). 10/01/20  Yes Pahwani, Kasandra Knudsen, MD  JARDIANCE 10 MG TABS tablet Take by mouth. 03/12/21  Yes [provider]  lisinopril (ZESTRIL) 10 MG tablet Take 10 mg by mouth daily. 01/15/23  Yes [provider]  methocarbamol (ROBAXIN) 500 MG tablet Take by mouth daily. As needed   Yes [provider]  metoprolol succinate (TOPROL-XL) 25 MG 24 hr tablet Take 12.5 mg by mouth daily. 08/28/21 01/04/24 Yes [provider]  mirtazapine (REMERON) 7.5 MG tablet Take 7.5 mg by mouth at bedtime. 06/20/23 06/19/24 Yes [provider]  nitroGLYCERIN (NITROSTAT) 0.4 MG SL tablet Place 0.4 mg under the tongue every 5 (five) minutes as needed for chest pain.   Yes [provider]  sertraline (ZOLOFT) 100 MG tablet Take 1 tablet (100 mg total) by mouth daily. Patient taking differently: Take 150 mg by mouth daily. 10/01/20  Yes Pahwani, Kasandra Knudsen, MD  SPIRIVA HANDIHALER 18 MCG inhalation capsule INHALE THE CONTENTS OF 1 CAPSULE VIA INHALATION DEVICE EVERY DAY Patient taking differently: Place 18 mcg into inhaler and inhale daily as needed (shortness of breath). 12/03/20  Yes Kerri Perches, MD  tamsulosin (FLOMAX) 0.4 MG CAPS capsule Take 1 capsule by mouth daily.   Yes [provider]  traZODone (DESYREL) 100 MG tablet Take 100 mg by mouth at bedtime. 11/01/22  Yes [provider]  amLODipine (NORVASC) 5 MG tablet Take 5 mg by mouth daily. Patient not taking: Reported on 01/04/2024 10/08/20   [provider]  aspirin EC 81  MG tablet Take 81 mg by mouth daily. Swallow whole. Patient not taking: Reported on 01/04/2024    [provider]  diclofenac Sodium (VOLTAREN) 1 % GEL Apply 1 application topically 4 (four) times daily as needed for pain. Patient not taking: Reported on 01/04/2024 03/11/21   [provider]  furosemide (LASIX) 20 MG tablet Take 20 mg by mouth daily. Patient not taking: Reported on 01/04/2024    [provider]  ipratropium-albuterol (DUONEB) 0.5-2.5 (3) MG/3ML SOLN Take 3 mLs by nebulization every 6 (six) hours as needed. Patient not taking: Reported on 01/04/2024 10/01/20   Ollen Bowl, MD  ketoconazole (NIZORAL) 2 % cream Apply 1 application topically daily as needed (fungus). Patient not taking: Reported on 01/04/2024 12/24/19   [provider]  meclizine (ANTIVERT) 25 MG tablet Take 1 tablet (25 mg total) by mouth 3 (three) times daily  as needed for dizziness. Patient not taking: Reported on 01/04/2024 10/01/20   Ollen Bowl, MD  metFORMIN (GLUCOPHAGE-XR) 500 MG 24 hr tablet Take 500 mg by mouth daily. Patient not taking: Reported on 01/04/2024    [provider]  OVER THE COUNTER MEDICATION Apply 1 application topically daily as needed (pain). Cbd cream    [provider]  spironolactone (ALDACTONE) 25 MG tablet Take 25 mg by mouth daily. Patient not taking: Reported on 01/04/2024    [provider]  traMADol (ULTRAM) 50 MG tablet Take by mouth. Patient not taking: Reported on 01/04/2024 10/11/22   [provider]    Physical Exam: Vitals:   01/04/24 1634 01/04/24 1831 01/04/24 2052 01/04/24 2200  BP:  (!) 175/99 (!) 152/91 (!) 147/85  Pulse:  60 (!) 58 64  Resp:  (!) 23 (!) 25 (!) 25  Temp:  98.9 F (37.2 C)  98.9 F (37.2 C)  TempSrc:  Oral  Oral  SpO2: 100% 98% 93% 91%   General: Not in acute distress HEENT:       Eyes: PERRL, EOMI, no jaundice       ENT: No discharge from the ears and nose, no pharynx  injection, no tonsillar enlargement.        Neck: Positive JVD, no bruit, no mass felt. Heme: No neck lymph node enlargement. Cardiac: S1/S2, RRR, No murmurs, No gallops or rubs. Respiratory: has fine crackles bilaterally GI: Soft, nondistended, nontender, no rebound pain, no organomegaly, BS present. GU: No hematuria Ext: has trace leg edema bilaterally. 1+DP/PT pulse bilaterally. Musculoskeletal: No joint deformities, No joint redness or warmth, no limitation of ROM in spin. Skin: No rashes.  Neuro: Alert, oriented X3, cranial nerves II-XII grossly intact, moves all extremities normally. Psych: Patient is not psychotic, no suicidal or hemocidal ideation.  Labs on Admission: I have personally reviewed following labs and imaging studies  CBC: Recent Labs  Lab 01/04/24 1433  WBC 6.6  HGB 13.3  HCT 42.8  MCV 92.6  PLT 63*   Basic Metabolic Panel: Recent Labs  Lab 01/04/24 1433  NA 141  K 4.1  CL 108  CO2 25  GLUCOSE 107*  BUN 28*  CREATININE 1.98*  CALCIUM 9.0   GFR: CrCl cannot be calculated (Unknown ideal weight.). Liver Function Tests: Recent Labs  Lab 01/04/24 1433  AST 27  ALT 30  ALKPHOS 91  BILITOT 1.1  PROT 7.0  ALBUMIN 3.9   No results for input(s): "LIPASE", "AMYLASE" in the last 168 hours. No results for input(s): "AMMONIA" in the last 168 hours. Coagulation Profile: No results for input(s): "INR", "PROTIME" in the last 168 hours. Cardiac Enzymes: No results for input(s): "CKTOTAL", "CKMB", "CKMBINDEX", "TROPONINI" in the last 168 hours. BNP (last 3 results) No results for input(s): "PROBNP" in the last 8760 hours. HbA1C: No results for input(s): "HGBA1C" in the last 72 hours. CBG: Recent Labs  Lab 01/04/24 2136  GLUCAP 90   Lipid Profile: No results for input(s): "CHOL", "HDL", "LDLCALC", "TRIG", "CHOLHDL", "LDLDIRECT" in the last 72 hours. Thyroid Function Tests: Recent Labs    01/04/24 1555  TSH 0.824  FREET4 0.98   Anemia  Panel: No results for input(s): "VITAMINB12", "FOLATE", "FERRITIN", "TIBC", "IRON", "RETICCTPCT" in the last 72 hours. Urine analysis:    Component Value Date/Time   COLORURINE STRAW (A) 01/04/2024 2154   APPEARANCEUR CLEAR (A) 01/04/2024 2154   APPEARANCEUR Clear 02/01/2023 1315   LABSPEC 1.005 01/04/2024 2154   PHURINE  6.0 01/04/2024 2154   GLUCOSEU 150 (A) 01/04/2024 2154   HGBUR SMALL (A) 01/04/2024 2154   BILIRUBINUR NEGATIVE 01/04/2024 2154   BILIRUBINUR Negative 02/01/2023 1315   KETONESUR NEGATIVE 01/04/2024 2154   PROTEINUR 30 (A) 01/04/2024 2154   NITRITE NEGATIVE 01/04/2024 2154   LEUKOCYTESUR TRACE (A) 01/04/2024 2154   Sepsis Labs: @LABRCNTIP (procalcitonin:4,lacticidven:4) ) Recent Results (from the past 240 hours)  Resp panel by RT-PCR (RSV, Flu A&B, Covid) Anterior Nasal Swab     Status: None   Collection Time: 01/04/24  3:32 PM   Specimen: Anterior Nasal Swab  Result Value Ref Range Status   SARS Coronavirus 2 by RT PCR NEGATIVE NEGATIVE Final    Comment: (NOTE) SARS-CoV-2 target nucleic acids are NOT DETECTED.  The SARS-CoV-2 RNA is generally detectable in upper respiratory specimens during the acute phase of infection. The lowest concentration of SARS-CoV-2 viral copies this assay can detect is 138 copies/mL. A negative result does not preclude SARS-Cov-2 infection and should not be used as the sole basis for treatment or other patient management decisions. A negative result may occur with  improper specimen collection/handling, submission of specimen other than nasopharyngeal swab, presence of viral mutation(s) within the areas targeted by this assay, and inadequate number of viral copies(<138 copies/mL). A negative result must be combined with clinical observations, patient history, and epidemiological information. The expected result is Negative.  Fact Sheet for Patients:  BloggerCourse.com  Fact Sheet for Healthcare  Providers:  SeriousBroker.it  This test is no t yet approved or cleared by the [place] FDA and  has been authorized for detection and/or diagnosis of SARS-CoV-2 by FDA under an Emergency Use Authorization (EUA). This EUA will remain  in effect (meaning this test can be used) for the duration of the COVID-19 declaration under Section 564(b)(1) of the Act, 21 U.S.C.section 360bbb-3(b)(1), unless the authorization is terminated  or revoked sooner.       Influenza A by PCR NEGATIVE NEGATIVE Final   Influenza B by PCR NEGATIVE NEGATIVE Final    Comment: (NOTE) The Xpert Xpress SARS-CoV-2/FLU/RSV plus assay is intended as an aid in the diagnosis of influenza from Nasopharyngeal swab specimens and should not be used as a sole basis for treatment. Nasal washings and aspirates are unacceptable for Xpert Xpress SARS-CoV-2/FLU/RSV testing.  Fact Sheet for Patients: BloggerCourse.com  Fact Sheet for Healthcare Providers: SeriousBroker.it  This test is not yet approved or cleared by the [place] FDA and has been authorized for detection and/or diagnosis of SARS-CoV-2 by FDA under an Emergency Use Authorization (EUA). This EUA will remain in effect (meaning this test can be used) for the duration of the COVID-19 declaration under Section 564(b)(1) of the Act, 21 U.S.C. section 360bbb-3(b)(1), unless the authorization is terminated or revoked.     Resp Syncytial Virus by PCR NEGATIVE NEGATIVE Final    Comment: (NOTE) Fact Sheet for Patients: BloggerCourse.com  Fact Sheet for Healthcare Providers: SeriousBroker.it  This test is not yet approved or cleared by the [place] FDA and has been authorized for detection and/or diagnosis of SARS-CoV-2 by FDA under an Emergency Use Authorization (EUA). This EUA will remain in effect (meaning this test can be  used) for the duration of the COVID-19 declaration under Section 564(b)(1) of the Act, 21 U.S.C. section 360bbb-3(b)(1), unless the authorization is terminated or revoked.  Performed at [facility], [address], [place], [place] [address]      Radiological Exams on Admission:   Assessment/Plan Principal  Problem:   Acute on chronic diastolic CHF (congestive heart failure) (HCC) Active Problems:   COPD (chronic obstructive pulmonary disease) (HCC)   CAD (coronary artery disease)   Myocardial injury   HTN (hypertension)   Hypercholesteremia   Type II diabetes mellitus with renal manifestations (HCC)   Thrombocytopenia (HCC)   Chronic kidney disease, stage 3b (HCC)   GERD (gastroesophageal reflux disease)   Depression   Assessment and Plan:   Acute on chronic diastolic CHF (congestive heart failure) (HCC): Patient only has trace leg edema, but he has significantly elevated BNP 1437, positive JVD and crackles on auscultation, clinically consistent with CHF exacerbation.  2D echo on 07/20/2023 showed EF> 55%.  -Place in tele bed for observation -Lasix 40 mg bid by IV -Daily weights -strict I/O's -Low salt diet -Fluid restriction -As needed bronchodilators for shortness of breath  COPD (chronic obstructive pulmonary disease) (HCC): No wheezing on my examination. -Bronchodilators and as needed Mucinex  CAD (coronary artery disease) and myocardial injury: trop  57--> 56.  Likely due to demand ischemia.  No chest pain. -Lipitor, Zetia -Patient is allergic to aspirin -Trend troponin -Check A1c, FLP  HTN (hypertension) -IV hydralazine as needed -Patient is on IV Lasix as above -Metoprolol  Hypercholesteremia -Lipitor and Zetia  Type II diabetes mellitus with renal manifestations (HCC): Recent A1c 5.1, well-controlled.  Patient is taking Jardiance and metformin at home -Sliding scale insulin  Thrombocytopenia (HCC): This is chronic issue.  Patient had  platelet 271 on 04/24/2023.  His platelet is 63 today.  No active bleeding. -Follow-up with CBC  Chronic kidney disease, stage 3b [facility]): Renal function stable.  Recent baseline creatinine 2.25 on 04/24/2023.  His creatinine is 1.98, BUN 28, GFR 33 today -Follow-up by BMP  GERD (gastroesophageal reflux disease) -Protonix -As needed Mylanta  Depression -Continue home medications    DVT ppx: SCD  Code Status: Full code    Family Communication:     not done, no family member is at bed side.   Disposition Plan:  Anticipate discharge back to previous environment  Consults called:  nonoe  Admission status and Level of care: Telemetry Cardiac:    for obs    Dispo: The patient is from: Home              Anticipated d/c is to: Home              Anticipated d/c date is: 1 day              Patient currently is not medically stable to d/c.    Severity of Illness:  The appropriate patient status for this patient is OBSERVATION. Observation status is judged to be reasonable and necessary in order to provide the required intensity of service to ensure the patient's safety. The patient's presenting symptoms, physical exam findings, and initial radiographic and laboratory data in the context of their medical condition is felt to place them at decreased risk for further clinical deterioration. Furthermore, it is anticipated that the patient will be medically stable for discharge from the hospital within 2 midnights of admission.        Date of Service 01/04/2024    Lorretta Harp [facility]   If 7PM-7AM, please contact night-coverage [email or web address] 01/04/2024, 11:42 PM

## 2024-01-04 NOTE — ED Provider Notes (Signed)
[facility] Provider Note    Event Date/Time   First MD Initiated Contact with Patient 01/04/24 1500     (approximate)   History   Chief Complaint: Shortness of Breath   HPI  Mario Patrick is a 83 y.o. male with a history of COPD, CHF, hypertension who comes to the ED due to worsening shortness of breath over the past 1 to 2 weeks.  No chest pain.  No cough or fever.  Denies lower extremity edema.  Unsure about orthopnea because he sleeps in a hospital bed at home and normally is not lying supine.  Denies PND.  He does have severe dyspnea on exertion, gets very short of breath with even minimal exertion.          Physical Exam   Triage Vital Signs: ED Triage Vitals [01/04/24 1430]  Encounter Vitals Group     BP (!) 178/96     Systolic BP Percentile      Diastolic BP Percentile      Pulse Rate 61     Resp 17     Temp 98.2 F (36.8 C)     Temp Source Oral     SpO2 100 %     Weight      Height      Head Circumference      Peak Flow      Pain Score 7     Pain Loc      Pain Education      Exclude from Growth Chart     Most recent vital signs: Vitals:   01/04/24 1634 01/04/24 1831  BP:  (!) 175/99  Pulse:  60  Resp:  (!) 23  Temp:  98.9 F (37.2 C)  SpO2: 100% 98%    General: Awake, no distress.  CV:  Good peripheral perfusion.  Regular rate rhythm.  There is JVD and hepatojugular reflex Resp:  Normal effort.  Bibasilar crackles.  No wheezing Abd:  No distention.  Soft nontender. Other:  No lower extremity edema.   ED Results / Procedures / Treatments   Labs (all labs ordered are listed, but only abnormal results are displayed) Labs Reviewed  CBC - Abnormal; Notable for the following components:      Result Value   Platelets 63 (*)    All other components within normal limits  COMPREHENSIVE METABOLIC PANEL - Abnormal; Notable for the following components:   Glucose, Bld 107 (*)    BUN 28 (*)    Creatinine, Ser 1.98  (*)    GFR, Estimated 33 (*)    All other components within normal limits  BRAIN NATRIURETIC PEPTIDE - Abnormal; Notable for the following components:   B Natriuretic Peptide 1,437.9 (*)    All other components within normal limits  TROPONIN I (HIGH SENSITIVITY) - Abnormal; Notable for the following components:   Troponin I (High Sensitivity) 57 (*)    All other components within normal limits  RESP PANEL BY RT-PCR (RSV, FLU A&B, COVID)  [id number]  TSH  T4, FREE  URINALYSIS, W/ REFLEX TO CULTURE (INFECTION SUSPECTED)  TROPONIN I (HIGH SENSITIVITY)     EKG Interpreted by me Sinus rhythm rate of 64.  Normal axis, LVH with repolarization abnormality.  No acute ischemic changes.   RADIOLOGY Chest x-ray interpreted by me, unremarkable.  Radiology report reviewed   PROCEDURES:  Procedures   MEDICATIONS ORDERED IN ED: Medications  albuterol (PROVENTIL) (2.5 MG/3ML) 0.083% nebulizer solution 2.5 mg (has  no administration in time range)  dextromethorphan-guaiFENesin (MUCINEX DM) 30-600 MG per 12 hr tablet 1 tablet (has no administration in time range)  ondansetron (ZOFRAN) injection 4 mg (has no administration in time range)  hydrALAZINE (APRESOLINE) injection 5 mg (has no administration in time range)  acetaminophen (TYLENOL) tablet 650 mg (has no administration in time range)  furosemide (LASIX) injection 40 mg (40 mg Intravenous Given 01/04/24 1833)     IMPRESSION / MDM / ASSESSMENT AND PLAN / ED COURSE  I reviewed the triage vital signs and the nursing notes.  DDx: NSTEMI, CHF exacerbation, hypothyroidism, COVID, influenza, pneumonia, pleural effusion, pulmonary edema, AKI, electrolyte abnormality, anemia  Patient's presentation is most consistent with acute presentation with potential threat to life or bodily function.  Patient presents with worsening dyspnea on exertion over the past week.  Fairly symptomatic.  Not hypoxic, but even pivoting from wheelchair to treatment bed  in the ED room the patient became severely dyspneic.  Exam is consistent with a degree of pulmonary edema.  BNP very high on my panel.  Patient given IV Lasix, case discussed with hospitalist for further management.       FINAL CLINICAL IMPRESSION(S) / ED DIAGNOSES   Final diagnoses:  Acute on chronic congestive heart failure, unspecified heart failure type (HCC)     Rx / DC Orders   ED Discharge Orders     None        Note:  This document was prepared using Dragon voice recognition software and may include unintentional dictation errors.   Sharman Cheek, MD 01/04/24 [phone]

## 2024-01-05 ENCOUNTER — Observation Stay (HOSPITAL_BASED_OUTPATIENT_CLINIC_OR_DEPARTMENT_OTHER)
Admit: 2024-01-05 | Discharge: 2024-01-05 | Disposition: A | Discharge status: Home / Self Care | Payer: Medicare Other | Attending: Internal Medicine | Admitting: Internal Medicine

## 2024-01-05 DIAGNOSIS — I509 Heart failure, unspecified: Secondary | ICD-10-CM

## 2024-01-05 DIAGNOSIS — K219 Gastro-esophageal reflux disease without esophagitis: Secondary | ICD-10-CM

## 2024-01-05 DIAGNOSIS — I2510 Atherosclerotic heart disease of native coronary artery without angina pectoris: Secondary | ICD-10-CM

## 2024-01-05 DIAGNOSIS — J449 Chronic obstructive pulmonary disease, unspecified: Secondary | ICD-10-CM

## 2024-01-05 DIAGNOSIS — I5033 Acute on chronic diastolic (congestive) heart failure: Secondary | ICD-10-CM | POA: Diagnosis not present

## 2024-01-05 DIAGNOSIS — I5A Non-ischemic myocardial injury (non-traumatic): Secondary | ICD-10-CM | POA: Diagnosis not present

## 2024-01-05 DIAGNOSIS — E1122 Type 2 diabetes mellitus with diabetic chronic kidney disease: Secondary | ICD-10-CM

## 2024-01-05 LAB — BASIC METABOLIC PANEL
Anion gap: 8 (ref 5–15)
Anion gap: 11 (ref 5–15)
BUN: 41 mg/dL — ABNORMAL HIGH (ref 8–23)
BUN: 44 mg/dL — ABNORMAL HIGH (ref 8–23)
CO2: 27 mmol/L (ref 22–32)
CO2: 26 mmol/L (ref 22–32)
Calcium: 8.6 mg/dL — ABNORMAL LOW (ref 8.9–10.3)
Calcium: 8.9 mg/dL (ref 8.9–10.3)
Chloride: 105 mmol/L (ref 98–111)
Chloride: 103 mmol/L (ref 98–111)
Creatinine, Ser: 2.76 mg/dL — ABNORMAL HIGH (ref 0.61–1.24)
Creatinine, Ser: 2.94 mg/dL — ABNORMAL HIGH (ref 0.61–1.24)
GFR, Estimated: 22 mL/min — ABNORMAL LOW (ref >60)
GFR, Estimated: 21 mL/min — ABNORMAL LOW (ref >60)
Glucose, Bld: 110 mg/dL — ABNORMAL HIGH (ref 70–99)
Glucose, Bld: 127 mg/dL — ABNORMAL HIGH (ref 70–99)
Potassium: 3.9 mmol/L (ref 3.5–5.1)
Potassium: 3.2 mmol/L — ABNORMAL LOW (ref 3.5–5.1)
Sodium: 140 mmol/L (ref 135–145)
Sodium: 140 mmol/L (ref 135–145)

## 2024-01-05 LAB — CBC
HCT: 40.4 % (ref 39.0–52.0)
Hemoglobin: 12.8 g/dL — ABNORMAL LOW (ref 13.0–17.0)
MCH: 28.9 pg (ref 26.0–34.0)
MCHC: 31.7 g/dL (ref 30.0–36.0)
MCV: 91.2 fL (ref 80.0–100.0)
Platelets: 66 10*3/uL — ABNORMAL LOW (ref 150–400)
RBC: 4.43 MIL/uL (ref 4.22–5.81)
RDW: 14.6 % (ref 11.5–15.5)
WBC: 7.1 10*3/uL (ref 4.0–10.5)
nRBC: 0 % (ref 0.0–0.2)

## 2024-01-05 LAB — LIPID PANEL
Cholesterol: 112 mg/dL (ref 0–200)
HDL: 39 mg/dL — ABNORMAL LOW (ref >40)
LDL Cholesterol: 62 mg/dL (ref 0–99)
Total CHOL/HDL Ratio: 2.9 {ratio}
Triglycerides: 55 mg/dL (ref <150)
VLDL: 11 mg/dL (ref 0–40)

## 2024-01-05 LAB — ECHOCARDIOGRAM COMPLETE
AR max vel: 1.53 cm2
AV Area VTI: 1.34 cm2
AV Area mean vel: 1.38 cm2
AV Mean grad: 4 mm[Hg]
AV Peak grad: 7.6 mm[Hg]
Ao pk vel: 1.38 m/s
Area-P 1/2: 4.06 cm2
S' Lateral: 3.6 cm

## 2024-01-05 LAB — CBG MONITORING, ED
Glucose-Capillary: 100 mg/dL — ABNORMAL HIGH (ref 70–99)
Glucose-Capillary: 202 mg/dL — ABNORMAL HIGH (ref 70–99)

## 2024-01-05 LAB — HEMOGLOBIN A1C
Hgb A1c MFr Bld: 5.6 % (ref 4.8–5.6)
Mean Plasma Glucose: 114.02 mg/dL

## 2024-01-05 MED ORDER — POTASSIUM CHLORIDE CRYS ER 20 MEQ PO TBCR
20.0000 meq | EXTENDED_RELEASE_TABLET | Freq: Once | ORAL | Status: AC
  Administered 2024-01-05: 20 meq via ORAL
  Filled 2024-01-05: qty 1

## 2024-01-05 NOTE — Discharge Instructions (Addendum)
Recommendations at discharge:  Please obtain CBC and renal function on follow-up Follow-up with primary care provider Follow-up with cardiology Follow-up with nephrology  STOP taking these medications     amLODipine 5 MG tablet Commonly known as: NORVASC    aspirin EC 81 MG tablet    diclofenac Sodium 1 % Gel Commonly known as: VOLTAREN    furosemide 20 MG tablet Commonly known as: LASIX    ketoconazole 2 % cream Commonly known as: NIZORAL    metFORMIN 500 MG 24 hr tablet Commonly known as: GLUCOPHAGE-XR    spironolactone 25 MG tablet Commonly known as: ALDACTONE    traMADol 50 MG tablet Commonly known as: ULTRAM           TAKE these medications     acetaminophen 500 MG tablet Commonly known as: TYLENOL Take 500 mg by mouth every 6 (six) hours as needed for moderate pain.    albuterol 108 (90 Base) MCG/ACT inhaler Commonly known as: VENTOLIN HFA Inhale 1-2 puffs into the lungs every 6 (six) hours as needed for wheezing or shortness of breath. What changed: how much to take    atorvastatin 80 MG tablet Commonly known as: LIPITOR Take 1 tablet by mouth at bedtime.    ezetimibe 10 MG tablet Commonly known as: ZETIA Take 1 tablet (10 mg total) by mouth daily.    finasteride 5 MG tablet Commonly known as: PROSCAR Take 1 tablet (5 mg total) by mouth daily.    fluticasone 50 MCG/ACT nasal spray Commonly known as: FLONASE SHAKE LIQUID AND USE 2 SPRAYS IN EACH NOSTRIL EVERY DAY    Fluticasone-Salmeterol 250-50 MCG/DOSE Aepb Commonly known as: ADVAIR Inhale 1 puff into the lungs 2 (two) times daily. What changed:  when to take this reasons to take this    ipratropium-albuterol 0.5-2.5 (3) MG/3ML Soln Commonly known as: DUONEB Take 3 mLs by nebulization every 6 (six) hours as needed.    Jardiance 10 MG Tabs tablet Generic drug: empagliflozin Take by mouth.    lisinopril 10 MG tablet Commonly known as: ZESTRIL Take 10 mg by mouth daily.    meclizine  25 MG tablet Commonly known as: ANTIVERT Take 1 tablet (25 mg total) by mouth 3 (three) times daily as needed for dizziness.    methocarbamol 500 MG tablet Commonly known as: ROBAXIN Take by mouth daily. As needed    metoprolol succinate 25 MG 24 hr tablet Commonly known as: TOPROL-XL Take 12.5 mg by mouth daily.    mirtazapine 7.5 MG tablet Commonly known as: REMERON Take 7.5 mg by mouth at bedtime.    nitroGLYCERIN 0.4 MG SL tablet Commonly known as: NITROSTAT Place 0.4 mg under the tongue every 5 (five) minutes as needed for chest pain.    OVER THE COUNTER MEDICATION Apply 1 application topically daily as needed (pain). Cbd cream    sertraline 100 MG tablet Commonly known as: ZOLOFT Take 1 tablet (100 mg total) by mouth daily. What changed: how much to take    Spiriva HandiHaler 18 MCG inhalation capsule Generic drug: tiotropium INHALE THE CONTENTS OF 1 CAPSULE VIA INHALATION DEVICE EVERY DAY What changed: See the new instructions.    tamsulosin 0.4 MG Caps capsule Commonly known as: FLOMAX Take 1 capsule by mouth daily.    traZODone 100 MG tablet Commonly known as: DESYREL Take 100 mg by mouth at bedtime.             Follow-up Information       Greggory Stallion,  Sionne A, MD. Schedule an appointment as soon as possible for a visit in 1 week(s).   Specialty: Family Medicine Contact information: [address] [place] [address] [phone]

## 2024-01-05 NOTE — Evaluation (Signed)
Occupational Therapy Evaluation Patient Details Name: Mario Patrick MRN: [id number] DOB: 12-13-1940 Today's Date: 01/05/2024   History of Present Illness   Pt is an 83 y.o. male presenting to hospital 01/04/24 with c/o worsening SOB past 1-2 weeks.  Pt admitted with acute on chronic diastolic CHF, COPD, CAD, htn.  PMH includes CHF, htn, HLD, DM, COPD, CAD, CABG, depression, BPH, CKD-3B, thrombocytopenia, back sx, aortic valve replacement, kyphoplasty.     Clinical Impressions PTA, pt reports being mod independent in ADLs with family available 24/7 to support with IADLs. Pt on RA start of eval, SpO2 93%. Pt does endorse mild SOB with exertion, and requests supplemental O2 for comfort with O2 at 91%. 1L/min applied via [place], and pt endorses improvement in SOB symptoms for ADL assessment. Pt able to stand from normal bed height with supervision, mild decreased safety awareness noted. LB ADLs supervision to simulate socks, and dons gown in standing with minA. Pt states he feels at baseline.  Pt would benefit from skilled OT services to address noted impairments and functional limitations (see below for any additional details) in order to maximize safety and independence while minimizing falls risk and caregiver burden. Anticipate the need for follow up [facility] OT services upon acute hospital DC.      If plan is discharge home, recommend the following:   A little help with walking and/or transfers;A little help with bathing/dressing/bathroom;Assistance with cooking/housework;Direct supervision/assist for medications management;Direct supervision/assist for financial management;Assist for transportation;Help with stairs or ramp for entrance;Supervision due to cognitive status     Functional Status Assessment   Patient has had a recent decline in their functional status and demonstrates the ability to make significant improvements in function in a reasonable and predictable amount of time.      Equipment Recommendations   None recommended by OT      Precautions/Restrictions   Precautions Precautions: Fall Restrictions Weight Bearing Restrictions Per Provider Order: No Other Position/Activity Restrictions: Pt reports h/o R shoulder rotator cuff tear.     Mobility Bed Mobility Overal bed mobility: Needs Assistance Bed Mobility: Supine to Sit, Sit to Supine     Supine to sit: Supervision, HOB elevated Sit to supine: Supervision, HOB elevated   General bed mobility comments: mild increased effort to perform on own    Transfers Overall transfer level: Needs assistance Equipment used: Rolling walker (2 wheels), None Transfers: Sit to/from Stand Sit to Stand: Contact guard assist           General transfer comment: pt spontanously standing from EOB with no AD (poor safety awareness, stands and says "well its easier with something to hold on to" despite OT asking      Balance Overall balance assessment: Needs assistance Sitting-balance support: No upper extremity supported, Feet supported Sitting balance-Leahy Scale: Good Sitting balance - Comments: steady reaching within BOS with L UE   Standing balance support: Bilateral upper extremity supported, During functional activity, Reliant on assistive device for balance Standing balance-Leahy Scale: Good                             ADL either performed or assessed with clinical judgement   ADL Overall ADL's : Needs assistance/impaired Eating/Feeding: Minimal assistance;Bed level Eating/Feeding Details (indicate cue type and reason): will need minA for opening containers                 Lower Body Dressing: Supervision/safety;Sit to/from stand Lower Body Dressing  Details (indicate cue type and reason): pt simulates donning/doffing socks supervision, no LOB, states at baseline he gets SOB with activity, but does not note SOB on 1L via [place] during ADL performance Toilet Transfer: Contact  guard assist;Rolling walker (2 wheels);BSC/3in1 Toilet Transfer Details (indicate cue type and reason): clinical judgement, pt was able to step forwards and backwards with CGA and RW. anticipate SOB with exertion when on RA Toileting- Clothing Manipulation and Hygiene: Contact guard assist;Sit to/from stand       Functional mobility during ADLs: Contact guard assist;Cueing for safety;Rolling walker (2 wheels)       Vision Baseline Vision/History: 1 Wears glasses Ability to See in Adequate Light: 0 Adequate Vision Assessment?: Wears glasses for reading            Pertinent Vitals/Pain Pain Assessment Pain Assessment: No/denies pain     Extremity/Trunk Assessment Upper Extremity Assessment Upper Extremity Assessment: Generalized weakness;Left hand dominant (impaired bilat fmc (difficulties opening containers))   Lower Extremity Assessment Lower Extremity Assessment: Generalized weakness   Cervical / Trunk Assessment Cervical / Trunk Assessment: Normal   Communication Communication Communication: Impaired Factors Affecting Communication: Hearing impaired   Cognition Arousal: Alert Behavior During Therapy: WFL for tasks assessed/performed                                 Following commands: Intact       Cueing  General Comments   Cueing Techniques: Verbal cues              Home Living Family/patient expects to be discharged to:: Private residence Living Arrangements: Alone Available Help at Discharge: Family Type of Home: House Home Access: Ramped entrance     Home Layout: Two level;Able to live on main level with bedroom/bathroom     Bathroom Shower/Tub: Producer, television/film/video: Handicapped height     Home Equipment: Shower seat - built in;Grab bars - tub/shower;Grab bars - toilet;Cane - single point;Rolling Walker (2 wheels);Wheelchair - manual (adjustable bed)          Prior Functioning/Environment Prior Level of  Function : Needs assist       Physical Assist : ADLs (physical)   ADLs (physical): IADLs Mobility Comments: Pt reports being modified independent ambulating with SPC; no recent falls; uses manual w/c in community ADLs Comments: Pt's DIL assists with changing linens, meal preparation.  Pt reports he is currently not driving.    OT Problem List: Decreased activity tolerance;Impaired balance (sitting and/or standing);Decreased safety awareness;Cardiopulmonary status limiting activity;Decreased range of motion;Decreased strength   OT Treatment/Interventions: Self-care/ADL training;Therapeutic exercise;Neuromuscular education;Energy conservation;DME and/or AE instruction;Therapeutic activities;Patient/family education;Balance training      OT Goals(Current goals can be found in the care plan section)   Acute Rehab OT Goals OT Goal Formulation: With patient Time For Goal Achievement: 01/19/24 Potential to Achieve Goals: Good   OT Frequency:  Min 1X/week       AM-PAC OT "6 Clicks" Daily Activity     Outcome Measure Help from another person eating meals?: A Little Help from another person taking care of personal grooming?: A Little Help from another person toileting, which includes using toliet, bedpan, or urinal?: A Little Help from another person bathing (including washing, rinsing, drying)?: A Little Help from another person to put on and taking off regular upper body clothing?: A Little Help from another person to put on and taking off regular lower body clothing?:  A Little 6 Click Score: 18   End of Session Equipment Utilized During Treatment: Oxygen;Rolling walker (2 wheels) Nurse Communication: Mobility status  Activity Tolerance: Patient tolerated treatment well Patient left: in bed;with call bell/phone within reach;with bed alarm set  OT Visit Diagnosis: Unsteadiness on feet (R26.81);Other abnormalities of gait and mobility (R26.89)                Time: 1610-9604 OT  Time Calculation (min): 36 min Charges:  OT General Charges $OT Visit: 1 Visit OT Evaluation $OT Eval Low Complexity: 1 Low OT Treatments $Self Care/Home Management : 8-22 mins Blu Lori L. Myer Bohlman, OTR/L  01/05/24, 3:09 PM

## 2024-01-05 NOTE — ED Notes (Addendum)
PT bedside

## 2024-01-05 NOTE — TOC CM/SW Note (Signed)
RN informed CSW pt is with [facility] for [facility]HS and discharging today. CSW reached out to [place] at [facility] to make her aware of pt's discharge today. Provided update to RN and attending. TOC signing off.

## 2024-01-05 NOTE — Discharge Summary (Signed)
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   Mario Patrick is a 83 y.o. male with medical history significant of the CHF, HTH, HLD, DM, COPD, CAD, CABG, depression, BPH, CKD-3B, thrombocytopenia, who presents with progressively worsening shortness of breath for 2-week.  ROS positive for some cough and acid reflux.  On presentation patient had elevated blood pressure at 175/99, labs pertinent for BNP 1437, troponin 57>> 56, negative PCR for COVID, flu and RSV. Chest x-ray negative for any acute abnormality. EKG with sinus rhythm, QTc 436, T wave inversion in inferolateral leads. Patient has positive JVD, fine crackles and 1+ LE edema consistent with acute on chronic CHF.  Started on IV Lasix.  2/13: Vital stable, UOP of 1700 recorded, CBC stable, renal function with worsening BUN to 41 and creatinine to 2.76 from 1.98.  Patient received total of 2 doses of IV Lasix at 40 mg each.  Further IV diuresis was held and patient was instructed to keep himself well-hydrated. Echocardiogram with normal EF, grade 1  diastolic dysfunction and no regional wall motion abnormalities.  There was also noted moderate left ventricular hypertrophy.  Mild to moderate dilatation of left atrium.  No other significant abnormality.  Saturating well on room air but wants to get oxygen.  We ambulated the patient without any significant desaturation to qualify him for home oxygen.  Had a lengthy discussion with stepdaughter, patient with some progressive exertional dyspnea.  Advised patient to follow-up with his own cardiologist and nephrologist at [facility], likely multifactorial with his history of HFpEF and advanced renal disease.  PT evaluated him and recommended home health services which were ordered.  Apparently home diuretics were recently discontinued by his provider.  Patient will continue on his current medications and need to have a close follow-up with his providers for further recommendations.  Consultants: None Procedures performed: None Disposition: Home health Diet recommendation:  Discharge Diet Orders (From admission, onward)     Start     Ordered   01/05/24 0000  Diet - low sodium heart healthy        01/05/24 1434           Cardiac diet DISCHARGE MEDICATION: Allergies as of 01/05/2024       Reactions   Contrast Media  [iodinated Contrast Media] Nausea Only   GI upset   Aspirin Other (See Comments)   Thrombocytopenia (PER PT CAN TAKE LOW DOSE ASPIRIN)        Medication List     STOP taking these medications    amLODipine 5 MG tablet Commonly known as: NORVASC   aspirin EC 81 MG tablet   diclofenac Sodium 1 %  Gel Commonly known as: VOLTAREN   furosemide 20 MG tablet Commonly known as: LASIX   ketoconazole 2 % cream Commonly known as: NIZORAL   metFORMIN 500 MG 24 hr tablet Commonly known as: GLUCOPHAGE-XR   spironolactone 25 MG tablet Commonly known as: ALDACTONE   traMADol 50 MG tablet Commonly known as: ULTRAM       TAKE these medications    acetaminophen  500 MG tablet Commonly known as: TYLENOL Take 500 mg by mouth every 6 (six) hours as needed for moderate pain.   albuterol 108 (90 Base) MCG/ACT inhaler Commonly known as: VENTOLIN HFA Inhale 1-2 puffs into the lungs every 6 (six) hours as needed for wheezing or shortness of breath. What changed: how much to take   atorvastatin 80 MG tablet Commonly known as: LIPITOR Take 1 tablet by mouth at bedtime.   ezetimibe 10 MG tablet Commonly known as: ZETIA Take 1 tablet (10 mg total) by mouth daily.   finasteride 5 MG tablet Commonly known as: PROSCAR Take 1 tablet (5 mg total) by mouth daily.   fluticasone 50 MCG/ACT nasal spray Commonly known as: FLONASE SHAKE LIQUID AND USE 2 SPRAYS IN EACH NOSTRIL EVERY DAY   Fluticasone-Salmeterol 250-50 MCG/DOSE Aepb Commonly known as: ADVAIR Inhale 1 puff into the lungs 2 (two) times daily. What changed:  when to take this reasons to take this   ipratropium-albuterol 0.5-2.5 (3) MG/3ML Soln Commonly known as: DUONEB Take 3 mLs by nebulization every 6 (six) hours as needed.   Jardiance 10 MG Tabs tablet Generic drug: empagliflozin Take by mouth.   lisinopril 10 MG tablet Commonly known as: ZESTRIL Take 10 mg by mouth daily.   meclizine 25 MG tablet Commonly known as: ANTIVERT Take 1 tablet (25 mg total) by mouth 3 (three) times daily as needed for dizziness.   methocarbamol 500 MG tablet Commonly known as: ROBAXIN Take by mouth daily. As needed   metoprolol succinate 25 MG 24 hr tablet Commonly known as: TOPROL-XL Take 12.5 mg by mouth daily.   mirtazapine 7.5 MG tablet Commonly known as: REMERON Take 7.5 mg by mouth at bedtime.   nitroGLYCERIN 0.4 MG SL tablet Commonly known as: NITROSTAT Place 0.4 mg under the tongue every 5 (five) minutes as needed for chest pain.   OVER THE COUNTER MEDICATION Apply 1 application topically daily as needed (pain). Cbd cream   sertraline 100 MG tablet Commonly known as:  ZOLOFT Take 1 tablet (100 mg total) by mouth daily. What changed: how much to take   Spiriva HandiHaler 18 MCG inhalation capsule Generic drug: tiotropium INHALE THE CONTENTS OF 1 CAPSULE VIA INHALATION DEVICE EVERY DAY What changed: See the new instructions.   tamsulosin 0.4 MG Caps capsule Commonly known as: FLOMAX Take 1 capsule by mouth daily.   traZODone 100 MG tablet Commonly known as: DESYREL Take 100 mg by mouth at bedtime.        Follow-up Information     Rayetta Humphrey, MD. Schedule an appointment as soon as possible for a visit in 1 week(s).   Specialty: Family Medicine Contact information: [address] [place] [address] [phone]                Discharge Exam: There were no vitals filed for this visit. General.  Frail and malnourished elderly man, in no acute distress. Pulmonary.  Lungs clear bilaterally, normal respiratory effort. CV.  Regular rate and rhythm, no JVD, rub or murmur. Abdomen.  Soft, nontender,  nondistended, BS positive. CNS.  Alert and oriented .  No focal neurologic deficit. Extremities.  No edema, no cyanosis, pulses intact and symmetrical.  Condition at discharge: stable  The results of significant diagnostics from this hospitalization (including imaging, microbiology, ancillary and laboratory) are listed below for reference.   Imaging Studies: ECHOCARDIOGRAM COMPLETE Result Date: 01/05/2024    ECHOCARDIOGRAM REPORT   Patient Name:   BRAYN ECKSTEIN Date of Exam: 01/05/2024 Medical Rec #:  [id number]           Height:       66.0 in Accession #:    [id number]          Weight:       121.0 lb Date of Birth:  Jun 24, 1941          BSA:          1.615 m Patient Age:    82 years            BP:           130/77 mmHg Patient Gender: M                   HR:           60 bpm. Exam Location:  [facility] Procedure: 2D Echo, Cardiac Doppler and Color Doppler (Both Spectral and Color            Flow Doppler were utilized during procedure).  Indications:     CAD  History:         Patient has prior history of Echocardiogram examinations, most                  recent 10/01/2020. CHF, CAD, COPD; Risk Factors:Diabetes,                  Hypertension, Dyslipidemia and Former Smoker.  Sonographer:     Dondra Prader RVT RCS Referring Phys:  [id number] [facility] Amberley Hamler Diagnosing Phys: Julien Nordmann MD IMPRESSIONS  1. Left ventricular ejection fraction, by estimation, is 60 to 65%. The left ventricle has normal function. The left ventricle has no regional wall motion abnormalities. There is moderate left ventricular hypertrophy. Left ventricular diastolic parameters are consistent with Grade I diastolic dysfunction (impaired relaxation).  2. Right ventricular systolic function is normal. The right ventricular size is normal. Tricuspid regurgitation signal is inadequate for assessing PA pressure.  3. Left atrial size was mild to moderately dilated.  4. The mitral valve is normal in structure. Mild mitral valve regurgitation. No evidence of mitral stenosis.  5. The aortic valve is normal in structure. There is mild calcification of the aortic valve. Aortic valve regurgitation is not visualized. Aortic valve sclerosis/calcification is present, without any evidence of aortic stenosis.  6. The inferior vena cava is normal in size with greater than 50% respiratory variability, suggesting right atrial pressure of 3 mmHg. FINDINGS  Left Ventricle: Left ventricular ejection fraction, by estimation, is 60 to 65%. The left ventricle has normal function. The left ventricle has no regional wall motion abnormalities. The left ventricular internal cavity size was normal in size. There is  moderate left ventricular hypertrophy. Left ventricular diastolic parameters are consistent with Grade I diastolic dysfunction (impaired relaxation). Right Ventricle: The right ventricular size is normal. No increase in right ventricular wall thickness. Right ventricular systolic function is normal.  Tricuspid regurgitation signal is inadequate for assessing PA pressure. Left Atrium: Left atrial size was mild to moderately dilated. Right Atrium: Right atrial size  was normal in size. Pericardium: There is no evidence of pericardial effusion. Mitral Valve: The mitral valve is normal in structure. Mild mitral valve regurgitation. No evidence of mitral valve stenosis. Tricuspid Valve: The tricuspid valve is normal in structure. Tricuspid valve regurgitation is mild . No evidence of tricuspid stenosis. Aortic Valve: The aortic valve is normal in structure. There is mild calcification of the aortic valve. Aortic valve regurgitation is not visualized. Aortic valve sclerosis/calcification is present, without any evidence of aortic stenosis. Aortic valve mean gradient measures 4.0 mmHg. Aortic valve peak gradient measures 7.6 mmHg. Aortic valve area, by VTI measures 1.34 cm. Pulmonic Valve: The pulmonic valve was normal in structure. Pulmonic valve regurgitation is not visualized. No evidence of pulmonic stenosis. Aorta: The aortic root is normal in size and structure. Venous: The inferior vena cava is normal in size with greater than 50% respiratory variability, suggesting right atrial pressure of 3 mmHg. IAS/Shunts: No atrial level shunt detected by color flow Doppler. Additional Comments: 3D imaging was not performed.  LEFT VENTRICLE PLAX 2D LVIDd:         5.40 cm   Diastology LVIDs:         3.60 cm   LV e' medial:    4.87 cm/s LV PW:         1.10 cm   LV E/e' medial:  11.5 LV IVS:        0.90 cm   LV e' lateral:   5.68 cm/s LVOT diam:     1.60 cm   LV E/e' lateral: 9.9 LV SV:         43 LV SV Index:   27 LVOT Area:     2.01 cm  RIGHT VENTRICLE             IVC RV Basal diam:  3.90 cm     IVC diam: 1.60 cm RV Mid diam:    2.90 cm RV S prime:     10.90 cm/s TAPSE (M-mode): 1.6 cm LEFT ATRIUM             Index        RIGHT ATRIUM           Index LA diam:        3.20 cm 1.98 cm/m   RA Area:     25.90 cm LA Vol (A2C):    61.5 ml 38.08 ml/m  RA Volume:   95.30 ml  59.00 ml/m LA Vol (A4C):   77.6 ml 48.01 ml/m LA Biplane Vol: 77.1 ml 47.73 ml/m  AORTIC VALVE                    PULMONIC VALVE AV Area (Vmax):    1.53 cm     PV Vmax:       0.90 m/s AV Area (Vmean):   1.38 cm     PV Peak grad:  3.2 mmHg AV Area (VTI):     1.34 cm AV Vmax:           138.00 cm/s AV Vmean:          95.300 cm/s AV VTI:            0.324 m AV Peak Grad:      7.6 mmHg AV Mean Grad:      4.0 mmHg LVOT Vmax:         105.00 cm/s LVOT Vmean:        65.500 cm/s LVOT VTI:  0.216 m LVOT/AV VTI ratio: 0.67  AORTA Ao Root diam: 2.90 cm Ao Asc diam:  3.30 cm MITRAL VALVE MV Area (PHT): 4.06 cm    SHUNTS MV Decel Time: 187 msec    Systemic VTI:  0.22 m MV E velocity: 56.10 cm/s  Systemic Diam: 1.60 cm MV A velocity: 60.80 cm/s MV E/A ratio:  0.92 Julien Nordmann MD Electronically signed by Julien Nordmann MD Signature Date/Time: 01/05/2024/1:28:30 PM    Final    DG Chest 2 View Result Date: 01/04/2024 CLINICAL DATA:  Shortness of breath. EXAM: CHEST - 2 VIEW COMPARISON:  04/25/2020. FINDINGS: Bilateral lungs appear hyperexpanded and hyperlucent with coarse bronchovascular markings, in concerning for underlying COPD. Bilateral lungs otherwise appear clear. No dense consolidation or lung collapse. Bilateral costophrenic angles are clear. Stable cardio-mediastinal silhouette. No acute osseous abnormalities. Redemonstration of sternal ORIF. The soft tissues are within normal limits. IMPRESSION: No active cardiopulmonary disease.  Finding concerning for COPD. Electronically Signed   By: Jules Schick M.D.   On: 01/04/2024 16:31    Microbiology: Results for orders placed or performed during the hospital encounter of 01/04/24  Resp panel by RT-PCR (RSV, Flu A&B, Covid) Anterior Nasal Swab     Status: None   Collection Time: 01/04/24  3:32 PM   Specimen: Anterior Nasal Swab  Result Value Ref Range Status   SARS Coronavirus 2 by RT PCR NEGATIVE NEGATIVE  Final    Comment: (NOTE) SARS-CoV-2 target nucleic acids are NOT DETECTED.  The SARS-CoV-2 RNA is generally detectable in upper respiratory specimens during the acute phase of infection. The lowest concentration of SARS-CoV-2 viral copies this assay can detect is 138 copies/mL. A negative result does not preclude SARS-Cov-2 infection and should not be used as the sole basis for treatment or other patient management decisions. A negative result may occur with  improper specimen collection/handling, submission of specimen other than nasopharyngeal swab, presence of viral mutation(s) within the areas targeted by this assay, and inadequate number of viral copies(<138 copies/mL). A negative result must be combined with clinical observations, patient history, and epidemiological information. The expected result is Negative.  Fact Sheet for Patients:  BloggerCourse.com  Fact Sheet for Healthcare Providers:  SeriousBroker.it  This test is no t yet approved or cleared by the [place] FDA and  has been authorized for detection and/or diagnosis of SARS-CoV-2 by FDA under an Emergency Use Authorization (EUA). This EUA will remain  in effect (meaning this test can be used) for the duration of the COVID-19 declaration under Section 564(b)(1) of the Act, 21 U.S.C.section 360bbb-3(b)(1), unless the authorization is terminated  or revoked sooner.       Influenza A by PCR NEGATIVE NEGATIVE Final   Influenza B by PCR NEGATIVE NEGATIVE Final    Comment: (NOTE) The Xpert Xpress SARS-CoV-2/FLU/RSV plus assay is intended as an aid in the diagnosis of influenza from Nasopharyngeal swab specimens and should not be used as a sole basis for treatment. Nasal washings and aspirates are unacceptable for Xpert Xpress SARS-CoV-2/FLU/RSV testing.  Fact Sheet for Patients: BloggerCourse.com  Fact Sheet for Healthcare  Providers: SeriousBroker.it  This test is not yet approved or cleared by the [place] FDA and has been authorized for detection and/or diagnosis of SARS-CoV-2 by FDA under an Emergency Use Authorization (EUA). This EUA will remain in effect (meaning this test can be used) for the duration of the COVID-19 declaration under Section 564(b)(1) of the Act, 21 U.S.C. section 360bbb-3(b)(1), unless the authorization is  terminated or revoked.     Resp Syncytial Virus by PCR NEGATIVE NEGATIVE Final    Comment: (NOTE) Fact Sheet for Patients: BloggerCourse.com  Fact Sheet for Healthcare Providers: SeriousBroker.it  This test is not yet approved or cleared by the [place] FDA and has been authorized for detection and/or diagnosis of SARS-CoV-2 by FDA under an Emergency Use Authorization (EUA). This EUA will remain in effect (meaning this test can be used) for the duration of the COVID-19 declaration under Section 564(b)(1) of the Act, 21 U.S.C. section 360bbb-3(b)(1), unless the authorization is terminated or revoked.  Performed at [facility], [address] [place], [place] [address]     Labs: CBC: Recent Labs  Lab 01/04/24 1433 01/05/24 0441  WBC 6.6 7.1  HGB 13.3 12.8*  HCT 42.8 40.4  MCV 92.6 91.2  PLT 63* 66*   Basic Metabolic Panel: Recent Labs  Lab 01/04/24 1433 01/05/24 0441 01/05/24 1303  NA 141 140 140  K 4.1 3.9 3.2*  CL 108 105 103  CO2 25 27 26   GLUCOSE 107* 110* 127*  BUN 28* 41* 44*  CREATININE 1.98* 2.76* 2.94*  CALCIUM 9.0 8.6* 8.9   Liver Function Tests: Recent Labs  Lab 01/04/24 1433  AST 27  ALT 30  ALKPHOS 91  BILITOT 1.1  PROT 7.0  ALBUMIN 3.9   CBG: Recent Labs  Lab 01/04/24 2136 01/05/24 0804 01/05/24 1117  GLUCAP 90 100* 202*    Discharge time spent: greater than 30 minutes.  This record has been created using Software engineer. Errors have been sought and corrected,but may not always be located. Such creation errors do not reflect on the standard of care.   Signed: Arnetha Courser, MD [facility] 01/05/2024

## 2024-01-05 NOTE — Hospital Course (Addendum)
Taken from H&P.   Mario Patrick is a 83 y.o. male with medical history significant of the CHF, HTH, HLD, DM, COPD, CAD, CABG, depression, BPH, CKD-3B, thrombocytopenia, who presents with progressively worsening shortness of breath for 2-week.  ROS positive for some cough and acid reflux.  On presentation patient had elevated blood pressure at 175/99, labs pertinent for BNP 1437, troponin 57>> 56, negative PCR for COVID, flu and RSV. Chest x-ray negative for any acute abnormality. EKG with sinus rhythm, QTc 436, T wave inversion in inferolateral leads. Patient has positive JVD, fine crackles and 1+ LE edema consistent with acute on chronic CHF.  Started on IV Lasix.  2/13: Vital stable, UOP of 1700 recorded, CBC stable, renal function with worsening BUN to 41 and creatinine to 2.76 from 1.98.  Patient received total of 2 doses of IV Lasix at 40 mg each.  Further IV diuresis was held and patient was instructed to keep himself well-hydrated. Echocardiogram with normal EF, grade 1 diastolic dysfunction and no regional wall motion abnormalities.  There was also noted moderate left ventricular hypertrophy.  Mild to moderate dilatation of left atrium.  No other significant abnormality.  Saturating well on room air but wants to get oxygen.  We ambulated the patient without any significant desaturation to qualify him for home oxygen.  Had a lengthy discussion with stepdaughter, patient with some progressive exertional dyspnea.  Advised patient to follow-up with his own cardiologist and nephrologist at [facility], likely multifactorial with his history of HFpEF and advanced renal disease.  PT evaluated him and recommended home health services which were ordered.  Apparently home diuretics were recently discontinued by his provider.  Patient will continue on his current medications and need to have a close follow-up with his providers for further recommendations.

## 2024-01-05 NOTE — Evaluation (Signed)
Physical Therapy Evaluation Patient Details Name: Mario Patrick MRN: [id number] DOB: [birth year] Today's Date: 01/05/2024  History of Present Illness  Pt is an 83 y.o. male presenting to hospital 01/04/24 with c/o worsening SOB past 1-2 weeks.  Pt admitted with acute on chronic diastolic CHF, COPD, CAD, htn.  PMH includes CHF, htn, HLD, DM, COPD, CAD, CABG, depression, BPH, CKD-3B, thrombocytopenia, back sx, aortic valve replacement, kyphoplasty.  Clinical Impression  Prior to recent medical concerns, pt reports being modified independent ambulating with SPC; no recent falls reported; lives alone on main level of home with ramp to enter; has assist from family.  8/10 chronic R shoulder pain reported (d/t h/o rotator cuff tear)--nurse notified of pt's request for Tylenol.  Currently pt is SBA with bed mobility; CGA with transfer from bed; and CGA to ambulate 24 feet with RW use.  Mild SOB noted with activity but pt reported he felt the supplemental O2 was helping his breathing.  Pt's SpO2 sats on 2 L O2 via nasal cannula 92% or greater during sessions activities.  Pt would currently benefit from skilled PT to address noted impairments and functional limitations (see below for any additional details).  Upon hospital discharge, pt would benefit from ongoing therapy.     If plan is discharge home, recommend the following: A little help with walking and/or transfers;A little help with bathing/dressing/bathroom;Assistance with cooking/housework;Assist for transportation;Help with stairs or ramp for entrance   Can travel by private vehicle    Yes    Equipment Recommendations Rolling walker (2 wheels) (pt has RW at home already)  Recommendations for Other Services  OT consult    Functional Status Assessment Patient has had a recent decline in their functional status and demonstrates the ability to make significant improvements in function in a reasonable and predictable amount of time.      Precautions / Restrictions Precautions Precautions: Fall Restrictions Weight Bearing Restrictions Per Provider Order: No Other Position/Activity Restrictions: Pt reports h/o R shoulder rotator cuff tear.      Mobility  Bed Mobility Overal bed mobility: Needs Assistance Bed Mobility: Supine to Sit, Sit to Supine     Supine to sit: Supervision, HOB elevated Sit to supine: Supervision, HOB elevated   General bed mobility comments: mild increased effort to perform on own    Transfers Overall transfer level: Needs assistance Equipment used: Rolling walker (2 wheels) Transfers: Sit to/from Stand Sit to Stand: Contact guard assist           General transfer comment: vc's for UE placement; mild increased effort to stand from bed    Ambulation/Gait Ambulation/Gait assistance: Contact guard assist Gait Distance (Feet): 24 Feet Assistive device: Rolling walker (2 wheels) Gait Pattern/deviations: Step-through pattern, Decreased step length - right, Decreased step length - left Gait velocity: decreased     General Gait Details: steady ambulation with RW use  Stairs            Wheelchair Mobility     Tilt Bed    Modified Rankin (Stroke Patients Only)       Balance Overall balance assessment: Needs assistance Sitting-balance support: No upper extremity supported, Feet supported Sitting balance-Leahy Scale: Good Sitting balance - Comments: steady reaching within BOS with L UE   Standing balance support: Bilateral upper extremity supported, During functional activity, Reliant on assistive device for balance Standing balance-Leahy Scale: Good Standing balance comment: steady ambulating with RW use  Pertinent Vitals/Pain Pain Assessment Pain Assessment: 0-10 Pain Score: 8  Pain Location: chronic R shoulder pain (d/t h/o rotator cuff tear) Pain Descriptors / Indicators: Aching Pain Intervention(s): Limited activity  within patient's tolerance, Monitored during session, Repositioned, Patient requesting pain meds-RN notified (Nurse notified pt requesting Tylenol) HR 59-68 bpm during sessions activities.    Home Living Family/patient expects to be discharged to:: Private residence Living Arrangements: Alone Available Help at Discharge: Family Type of Home: House Home Access: Ramped entrance       Home Layout: Two level;Able to live on main level with bedroom/bathroom Home Equipment: Shower seat - built in;Grab bars - tub/shower;Grab bars - toilet;Cane - single point;Rolling Walker (2 wheels);Wheelchair - manual;Hospital bed      Prior Function Prior Level of Function : Needs assist       Physical Assist : ADLs (physical)   ADLs (physical): IADLs Mobility Comments: Pt reports being modified independent ambulating with SPC; no recent falls; uses manual w/c in community ADLs Comments: Pt's DIL assists with changing linens, meal preparation.  Pt reports he is currently not driving.     Extremity/Trunk Assessment   Upper Extremity Assessment Upper Extremity Assessment:  (Limited R shoulder active movement d/t h/o rotator cuff tear)    Lower Extremity Assessment Lower Extremity Assessment: Generalized weakness    Cervical / Trunk Assessment Cervical / Trunk Assessment: Normal  Communication   Communication Communication: Impaired Factors Affecting Communication: Hearing impaired (HOH (has hearing aids but battery ran out and doesn't have charger))    Cognition Arousal: Alert Behavior During Therapy: WFL for tasks assessed/performed   PT - Cognitive impairments: No apparent impairments                       PT - Cognition Comments: A&Ox4 Following commands: Intact       Cueing Cueing Techniques: Verbal cues     General Comments  Pt agreeable to therapy session.    Exercises     Assessment/Plan    PT Assessment Patient needs continued PT services  PT Problem List  Decreased strength;Decreased activity tolerance;Decreased balance;Decreased mobility;Cardiopulmonary status limiting activity       PT Treatment Interventions DME instruction;Gait training;Functional mobility training;Therapeutic activities;Therapeutic exercise;Balance training;Patient/family education    PT Goals (Current goals can be found in the Care Plan section)  Acute Rehab PT Goals Patient Stated Goal: to improve breathing with activity PT Goal Formulation: With patient Time For Goal Achievement: 01/19/24 Potential to Achieve Goals: Good    Frequency Min 1X/week     Co-evaluation               AM-PAC PT "6 Clicks" Mobility  Outcome Measure Help needed turning from your back to your side while in a flat bed without using bedrails?: None Help needed moving from lying on your back to sitting on the side of a flat bed without using bedrails?: A Little Help needed moving to and from a bed to a chair (including a wheelchair)?: A Little Help needed standing up from a chair using your arms (e.g., wheelchair or bedside chair)?: A Little Help needed to walk in hospital room?: A Little Help needed climbing 3-5 steps with a railing? : A Little 6 Click Score: 19    End of Session Equipment Utilized During Treatment: Oxygen (2 L via nasal cannula) Activity Tolerance: Patient tolerated treatment well Patient left: with call bell/phone within reach;with bed alarm set;Other (comment) (pt set up with breakfast) Nurse Communication:  Mobility status;Patient requests pain meds;Precautions PT Visit Diagnosis: Other abnormalities of gait and mobility (R26.89);Muscle weakness (generalized) (M62.81);Pain Pain - Right/Left: Right Pain - part of body: Shoulder    Time: 1308-6578 PT Time Calculation (min) (ACUTE ONLY): 24 min   Charges:   PT Evaluation $PT Eval Low Complexity: 1 Low PT Treatments $Therapeutic Activity: 8-22 mins PT General Charges $$ ACUTE PT VISIT: 1 Visit         Hendricks Limes, PT 01/05/24, 10:15 AM

## 2024-01-05 NOTE — Progress Notes (Signed)
Heart Failure Navigator Progress Note  Assessed for Heart & Vascular TOC clinic readiness.  Patient does not meet criteria due to current [facility] patient.   Navigator will sign off at this time.  Roxy Horseman, RN, BSN [facility] Heart Failure Navigator Secure Chat Only

## 2024-01-05 NOTE — Progress Notes (Signed)
*  PRELIMINARY RESULTS* Echocardiogram 2D Echocardiogram has been performed.  Mario Patrick 01/05/2024, 10:49 AM

## 2024-01-05 NOTE — ED Notes (Signed)
Oral care supplies and incentive spirometer with teachback given

## 2024-01-05 NOTE — ED Notes (Signed)
Pt placed in leg bag to d/c

## 2024-06-20 ENCOUNTER — Ambulatory Visit: Payer: Self-pay | Admitting: Urology

## 2024-08-28 ENCOUNTER — Ambulatory Visit
Admission: RE | Admit: 2024-08-28 | Discharge: 2024-08-28 | Disposition: A | Discharge status: Home / Self Care | Source: Ambulatory Visit | Attending: Physical Medicine & Rehabilitation | Admitting: Physical Medicine & Rehabilitation

## 2024-08-28 ENCOUNTER — Other Ambulatory Visit: Payer: Self-pay | Admitting: Physical Medicine & Rehabilitation

## 2024-08-28 DIAGNOSIS — G8929 Other chronic pain: Secondary | ICD-10-CM

## 2024-08-28 DIAGNOSIS — M5441 Lumbago with sciatica, right side: Secondary | ICD-10-CM | POA: Diagnosis present

## 2024-08-28 DIAGNOSIS — M5442 Lumbago with sciatica, left side: Secondary | ICD-10-CM | POA: Insufficient documentation

## 2024-08-28 NOTE — Progress Notes (Signed)
 Chief Complaint: Chief Complaint  Patient presents with  . Lower Back - Pain  Back pain traveling into the bilateral hips  HPI: Patient is a pleasant 83 y.o. male seen in follow-up for the evaluation of back pain traveling into the bilateral hips.  He is accompanied by his daughter-in-law, [name].  He is status post fall at home on 08/16/2024.  He states that he was reaching around to grab his walker and fell to the ground.  He states that he hit his back.  He did not seek any medical attention until today.  He rates his pain as a 10/10.  It is constant, sharp, aching. He does endorse weakness in his legs and his entire body.  He denies any numbness, tingling, or loss of control of bowel or bladder.  Pain is chronic. Pain is worse with walking, bending, lifting, squatting, coughing, sneezing and better with sitting and lying in bed as well as heat.  Of note he is status post L1 kyphoplasty 07/16/2021 with Dr. Kathlynn which did help with his upper back pain.  He states that he did do very well with this.  Of note he was hospitalized due to a UTI about a month ago.  He does have a suprapubic catheter now.  Review of Systems: A 10 point review of systems is negative, except for the pertinent positives and negatives detailed in the HPI.  PMH: Past Medical History:  Diagnosis Date  . Allergic rhinitis, unspecified 08/02/2023  . Allergic state 2000   contrast dye - nausia  . Anxiety   . Arthritis    hands and wrists  . Band-shaped keratopathy OD   . Bilateral renal cysts 01/11/2018   MRI done 12/2017 at [facility]  . Blind painful eye    OD  . BRAO (branch retinal artery occlusion)    OS  . CAD (coronary artery disease)   . CAD (coronary artery disease)    CAD s/p CABG x 3 vessels in 2005 ([facility]), COPD dx in 2001   . Carotid artery disease   . Carotid stenosis, bilateral 08/07/2015   100% stenosis b/l.   . Cataract cortical, senile 2003  . CHF (congestive heart failure) (CMS/HHS-HCC)  03/13/2021  . CHF (congestive heart failure) (CMS/HHS-HCC) 03/13/2021  . Choroidal folds    OS  . Choroidal folds, left eye 08/07/2012   No inflammation, ERM, or mass appreciated on US . CT orbits with out mass, laboratory evaluation including Thyroid  panel, PPD, ANCA, ANA RPR, FTA-ABS normal.   . Chronic bilateral low back pain with bilateral sciatica   . Chronic kidney disease (CKD), stage III (moderate) (CMS-HCC)    followed by [facility] nephrology Baseline Cr 1.7-1.8  . Chronic right shoulder pain    s/p rotator cuff repair  . Colon polyps 06/26/2012   Colonoscopy at [facility]  . COPD (chronic obstructive pulmonary disease) (CMS/HHS-HCC) 05/11/2013  . COPD (chronic obstructive pulmonary disease) (CMS/HHS-HCC) 05/11/2013  . Depression 2000  . History of non-ST elevation myocardial infarction (NSTEMI) 03/13/2021  . History of non-ST elevation myocardial infarction (NSTEMI) 03/13/2021  . Hyperlipidemia   . Hypertension   . Insomnia   . Osteoporosis   . PAD (peripheral artery disease)   . Prediabetes 01/31/2014  . Pseudophakia, left eye   . Renal insufficiency    followed by [facility] nephrology  . Retinopathy of prematurity   . Thrombocytopenia () 07/05/2013   90,000-150, 000 platelet range Follow[facility]  . Unspecified disorder  of choroid OS   . Unspecified vitamin D deficiency 05/10/2012  . Vision abnormalities   . Vision loss of right eye      PSH: Past Surgical History:  Procedure Laterality Date  . TONSILLECTOMY  1947  . SPINE SURGERY  1998   Neck  . cervical disc  1999  . ENUCLEATION EYE W/INSERTION ORBITAL IMPLANT Right 01/23/2016   Procedure: ENUCLEATION EYE W/INSERTION ORBITAL IMPLANT;  Surgeon: Serene Skeeter, MD;  [facility];  Service: Ophthalmology;  Laterality: Right;  . CLOSURE EYELIDS FROST SUTURE Right 01/23/2016   Procedure: TEMPORARY CLOSURE OF EYELIDS BY SUTURE (EG, FROST SUTURE);  Surgeon: Serene Skeeter, MD;  [facility];  Service: Ophthalmology;  Laterality: Right;  . COLONOSCOPY W/BIOPSY N/A 03/25/2016   Procedure: COLORECTAL CANCER SCREENING;  Surgeon: Tobias Rosealee Cleaves, MD;  Location: [facility];  Service: Gastroenterology;  Laterality: N/A;  . APPENDECTOMY     childhood  . CORONARY ARTERY BYPASS GRAFT    . CORONARY ARTERY BYPASS W/ARTERIAL GRAFTS  2004, approx  . FEMORAL ARTERY - FEMORAL ARTERY BYPASS GRAFT  2000, approx  . LENS EYE SURGERY    . ROTATOR CUFF REPAIR Right      Family History: Family History  Problem Relation Name Age of Onset  . No Known Problems Mother    . Cancer Father Depaul Arizpe Funari   . Other Father Carole Deere Pontiff        Lung cancer  . Coronary Artery Disease (Blocked arteries around heart) Father Amous Crewe Adel        High blood pressure.  Cancer.  Alcohol abuse  . Peripheral vascular disease Sister    . Peripheral vascular disease Brother    . Glaucoma Neg Hx    . Macular degeneration Neg Hx       Social History: Social History   Socioeconomic History  . Marital status: Widowed  Tobacco Use  . Smoking status: Former    Current packs/day: 0.00    Average packs/day: 1 pack/day for 50.0 years (50.0 ttl pk-yrs)    Types: Cigarettes    Start date: 11/22/1958    Quit date: 11/22/1998    Years since quitting: 25.7  . Smokeless tobacco: Never  . Tobacco comments:    50 pack years  Substance and Sexual Activity  . Alcohol use: Not Currently    Alcohol/week: 0.0 standard drinks of alcohol  . Drug use: No  . Sexual activity: Not Currently    Partners: Female    Birth control/protection: None  Other Topics Concern  . Would you please tell us  about the people who live in your home, your pets, or anything else important to your social life? Yes    Comment: Married. Have a small dog. 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  He is originally from [place], moved to [place] with his second wife about 25 years ago to be closer to their family.  Mr. Hackman was widowed 2 years ago; his wife had a long battle with cancer and passed away on hospice. This was very difficult for him. His son and daughter-in-law live close by and assist with medical appointments and ensure his needs are met.  A  friend Nickey) lives in his home (upstairs) and helps with medication management and safety.    - No ETOH in over 20 years, quit smoking in 2000      12/07/2023   Likes/Enjoys/What fills your day?:    Financial planner: Company secretary. [employer].    Driving Status: Reports driving, independently   Home: Two Story   Your Bedrooms is on: First Level   Fewest Steps to enter the home: 3 with railings   Other persons in the home: 1   Dental: No dental   Vision: patient to make appt. Wear's glasses. 2024. [employer]   Hearing: Patient wearing bilateral hearing aides. Patient to see [facility]. Myla Favre next week   Dermatology. No skin issues      Social Drivers of Corporate investment banker Strain: Low Risk  (05/16/2024)   Overall Financial Resource Strain (CARDIA)   . Difficulty of Paying Living Expenses: Not hard at all  Food Insecurity: No Food Insecurity (05/16/2024)   Hunger Vital Sign   . Worried About Programme researcher, broadcasting/film/video in the Last Year: Never true   . Ran Out of Food in the Last Year: Never true  Transportation Needs: No Transportation Needs (05/16/2024)   PRAPARE - Transportation   . Lack of Transportation (Medical): No   . Lack of Transportation (Non-Medical): No     Allergies: Allergies  Allergen Reactions  . Iodinated Contrast Media Nausea    GI upset  . Aspirin  Other (See Comments)    thrombocytopenia     Medications:  Current Outpatient Medications:  .  acetaminophen  325 mg Cap, Take  650 mg by mouth every 4 (four) hours as needed, Disp: , Rfl:  .  albuterol  90 mcg/actuation inhaler, Inhale 2 inhalations into the lungs every 6 (six) hours as needed, Disp: 54 g, Rfl: 3 .  amLODIPine (NORVASC) 2.5 MG tablet, Take 1 tablet (2.5 mg total) by mouth once daily, Disp: 90 tablet, Rfl: 3 .  atorvastatin  (LIPITOR) 80 MG tablet, TAKE 1 TABLET(80 MG) BY MOUTH AT BEDTIME, Disp: 100 tablet, Rfl: 3 .  cholecalciferol (VITAMIN D3) 2,000 unit capsule, Take 1 capsule (2,000 Units total) by mouth 3 (three) times a week, Disp: , Rfl:  .  ezetimibe  (ZETIA ) 10 mg tablet, Take 1 tablet (10 mg total) by mouth once daily, Disp: 90 tablet, Rfl: 3 .  ferrous sulfate (FER-IN-SOL) 15 mg iron (75 mg)/mL oral drops, Take 15 mg of iron by mouth once daily, Disp: , Rfl:  .  finasteride  (PROSCAR ) 5 mg tablet, TAKE 1 TABLET BY MOUTH EVERY DAY, Disp: 100 tablet, Rfl: 3 .  fluticasone  propion-salmeteroL (ADVAIR DISKUS) 250-50 mcg/dose diskus inhaler, Inhale 1 Puff into the lungs 2 (two) times daily, Disp: 1 each, Rfl: 3 .  FUROsemide  (LASIX ) 20 MG tablet, Take 0.5 tablets (10 mg total) by mouth once daily as needed (worsening swelling, shortness of breath), Disp: 15 tablet, Rfl: 6 .  lisinopriL  (ZESTRIL ) 10 MG tablet, TAKE 1 TABLET(10 MG) BY MOUTH EVERY DAY, Disp: 100 tablet, Rfl: 3 .  methocarbamoL  (ROBAXIN ) 500 MG tablet, Take 1 tablet (500 mg total) by mouth once daily as needed, Disp: 90 tablet, Rfl: 3 .  metoprolol  SUCCinate (TOPROL -XL) 25 MG XL tablet, Take 0.5 tablets (12.5 mg total) by mouth once daily, Disp: 45 tablet, Rfl: 3 .  mirtazapine  (REMERON ) 7.5 MG tablet, Take 1 tablet (7.5 mg total) by mouth at bedtime, Disp: 100 tablet, Rfl: 3 .  nitroGLYcerin  (NITROSTAT ) 0.4 MG  SL tablet, Place 1 tablet (0.4 mg total) under the tongue every 5 (five) minutes as needed for Chest pain May take up to 3 doses., Disp: 25 tablet, Rfl: 11 .  sertraline  (ZOLOFT ) 100 MG tablet, TAKE 1 AND 1/2 TABLETS(150 MG) BY MOUTH EVERY  DAY, Disp: 135 tablet, Rfl: 3 .  tamsulosin  (FLOMAX ) 0.4 mg capsule, Take 1 capsule (0.4 mg total) by mouth daily after dinner 30 MINUTES AFTER SAME MEAL, Disp: 30 capsule, Rfl: 11 .  traZODone  (DESYREL ) 100 MG tablet, Take 1 tablet (100 mg total) by mouth at bedtime, Disp: 90 tablet, Rfl: 3 .  cyanocobalamin (VITAMIN B12) 1000 MCG tablet, Take 1 tablet (1,000 mcg total) by mouth once daily, Disp: 30 tablet, Rfl: 11  Vitals: Vitals:   08/28/24 1119  BP: 136/75  Pulse: 55  Temp: 36.5 C (97.7 F)  TempSrc: Oral  Weight: 57.2 kg (126 lb)  Height: 170.2 cm (5' 7)  PainSc: 10-Worst pain ever  PainLoc: Back   Physical exam: Patient was examined in wheelchair with daughter in law at bedside; on inspection there were no abnormalities including no bruising; he does have tenderness to very gentle palpation over the lower thoracic and lumbar spinous processes; did not range lumbar spine due to falls risk; motor exam 5/5 bilateral lower extremity; negative seated slump bilaterally; sensation soft touch intact in bilateral lower extremities  Imaging: MRI lumbar spine done at [facility] 06/2021: Acute compression fracture L1 with 25% vertebral body loss; L2-3 mild CCS and bilateral NFS; L3-4 mild to moderate CCS and mild bilateral NFS; L4-5 mild CCS and bilateral NFS; facet arthropathy  Platelet count 59 drawn on 08/05/2024 GFR 31 drawn on 08/05/2024 Hemoglobin A1c 5.6 drawn on 01/04/2024  Assessment: Chronic low back pain travels into the bilateral hips -s/p bilateral L3, L4 medial branch and dorsal rami RFA 10/01/2021 with 85% improvement    -s/p bilateral L4-5 TFESI 02/14/2023 with 80% improvement -s/p bilateral L4-5 TFESI 07/08/2023 with 80% improvement -s/p bilateral L4-5 TFESI 12/26/2023 with 50% improvement  History of hypertension Status post right intertrochanteric fracture status post fixation 06/2023  History of chronic kidney disease stage IV - no NSAIDs  History of thrombocytopenia -  sees hematology  S/p L1 kyphoplasty 07/16/2021 with Dr. Kathlynn  **Iodinated contrast allergy causes GI upset -no reaction to Omnipaque  either from any of the epidural injections   Plan: I did review the electronic medical record including the PCP note from 08/10/2024.  The patient was admitted to [facility] 9/12 to 9/14 of this year for acute cystitis with hematuria and cellulitis.  He did have a suprapubic catheter placed and followed by urology.    Unfortunately patient did have a fall on 08/16/2024 and was not evaluated by anyone.  He did fall on his back.  He is having significant pain in his low back to very gentle palpation.  I am concerned for fracture.  I have ordered an x-ray of his lumbar and thoracic spine to be done today.  I also ordered a stat MRI lumbar spine.  I did discuss potential treatment options including kyphoplasty versus more conservative treatment with just pain medications.  He has had a kyphoplasty in the past and is interested in considering this if there is a fracture.  In terms of pain medications, I am very limited with what I can prescribe him.  He does not have anyone at home so I am concerned about prescribing any narcotics.  I cannot prescribe tramadol   due to potential sedating effect as well as interaction with other medications.  I cannot prescribe NSAIDs due to low GFR.  I do not want to empirically start him on prednisone  in case he does have a fracture.  He is currently taking Tylenol .  I did also recommend a Lidoderm  patch as well as potentially considering a Salonpas patch.      I did review the CBC and CMP from 08/05/2024.  Please see pertinent information above.    I cannot prescribe NSAIDs due to the chronic kidney disease as well as thrombocytopenia.  I also cannot prescribe tramadol  due to interactions of medications.       Follow-up after MRI lumbar spine.  I will consider referral for kyphoplasty.  He may call back for bilateral L4-5  TFESI no sooner than 03/24/2024.  Would also consider spinal cord stimulator.   Patient agrees with above plan.  Answered all questions.

## 2024-09-04 ENCOUNTER — Other Ambulatory Visit: Payer: Self-pay | Admitting: Physical Medicine & Rehabilitation

## 2024-09-04 DIAGNOSIS — M4856XA Collapsed vertebra, not elsewhere classified, lumbar region, initial encounter for fracture: Secondary | ICD-10-CM

## 2024-09-04 DIAGNOSIS — S22080A Wedge compression fracture of T11-T12 vertebra, initial encounter for closed fracture: Secondary | ICD-10-CM

## 2024-09-10 NOTE — Progress Notes (Signed)
 Chief Complaint: Patient was seen in consultation today for compression fractures.  Referring Physician(s): Morales,Jennifer I  History of Present Illness: Mario Patrick is an 83 y.o. male with a medical history significant for CHF, CKD, COPD, HTN, CAD/MI, PAD and right eye blindness. He fell at home 08/16/24 and hit his back. He was evaluated by his PCP 08/28/24 due to severe back pain that he described as constant, sharp and aching. He also endorsed bilateral leg weakness. The back pain is worse with almost any activity but is alleviated with sitting, lying and heat. He has a history of a prior L1 kyphoplasty 07/16/21 with Dr. Kathlynn.  An MR lumbar spine was obtained 08/28/24 and this was positive for acute/subacute compression fractures at T12 and L2. The patient experienced a positive outcome with his 2022 kyphoplasty and is interested in having these new compression fractures treated similarly.   The patient has been referred to Interventional Radiology for consideration of vertebral body augmentation of T12 and L2. He presents to the clinic today for further discussion.   Past Medical History:  Diagnosis Date   Anginal pain    Arthritis    Back ache    CHF (congestive heart failure) (HCC)    Chronic kidney disease    stage 3   COPD (chronic obstructive pulmonary disease) (HCC)    Coronary artery disease    recently checked carotid arteries, significant blockage   Depression    Dyspnea    Hypercholesteremia    Hypertension    Myocardial infarction [facility])     Past Surgical History:  Procedure Laterality Date   AORTIC VALVE REPLACEMENT     BACK SURGERY     CARDIAC SURGERY     CABG X4   COLONOSCOPY     KYPHOPLASTY N/A 07/16/2021   Procedure: L1 KYPHOPLASTY;  Surgeon: Kathlynn Sharper, MD;  Location: [facility] ORS;  Service: Orthopedics;  Laterality: N/A;    Allergies: Contrast media  [iodinated contrast media] and Aspirin   Medications: Prior to Admission medications    Medication Sig Start Date End Date Taking? Authorizing Provider  acetaminophen  (TYLENOL ) 500 MG tablet Take 500 mg by mouth every 6 (six) hours as needed for moderate pain.    [provider]  albuterol  (VENTOLIN  HFA) 108 (90 Base) MCG/ACT inhaler Inhale 1-2 puffs into the lungs every 6 (six) hours as needed for wheezing or shortness of breath. Patient taking differently: Inhale 2 puffs into the lungs every 6 (six) hours as needed for wheezing or shortness of breath. 03/06/21   Gwenn Kent, MD  atorvastatin  (LIPITOR) 80 MG tablet Take 1 tablet by mouth at bedtime. 12/22/22 01/04/24  [provider]  ezetimibe  (ZETIA ) 10 MG tablet Take 1 tablet (10 mg total) by mouth daily. 10/01/20   Pahwani, Velna SAUNDERS, MD  finasteride  (PROSCAR ) 5 MG tablet Take 1 tablet (5 mg total) by mouth daily. 11/02/22   Penne Knee, MD  fluticasone  (FLONASE ) 50 MCG/ACT nasal spray SHAKE LIQUID AND USE 2 SPRAYS IN EACH NOSTRIL EVERY DAY 04/20/22   [provider]  Fluticasone -Salmeterol (ADVAIR) 250-50 MCG/DOSE AEPB Inhale 1 puff into the lungs 2 (two) times daily. Patient taking differently: Inhale 1 puff into the lungs 2 (two) times daily as needed (shortness of breath). 10/01/20   Pahwani, Velna SAUNDERS, MD  ipratropium-albuterol  (DUONEB) 0.5-2.5 (3) MG/3ML SOLN Take 3 mLs by nebulization every 6 (six) hours as needed. Patient not taking: Reported on 01/04/2024 10/01/20   Vernon Velna SAUNDERS, MD  JARDIANCE 10  MG TABS tablet Take by mouth. 03/12/21   [provider]  lisinopril  (ZESTRIL ) 10 MG tablet Take 10 mg by mouth daily. 01/15/23   [provider]  meclizine  (ANTIVERT ) 25 MG tablet Take 1 tablet (25 mg total) by mouth 3 (three) times daily as needed for dizziness. Patient not taking: Reported on 01/04/2024 10/01/20   Vernon Velna SAUNDERS, MD  methocarbamol  (ROBAXIN ) 500 MG tablet Take by mouth daily. As needed    [provider]  metoprolol  succinate (TOPROL -XL) 25 MG 24 hr  tablet Take 12.5 mg by mouth daily. 08/28/21 01/04/24  [provider]  nitroGLYCERIN  (NITROSTAT ) 0.4 MG SL tablet Place 0.4 mg under the tongue every 5 (five) minutes as needed for chest pain.    [provider]  OVER THE COUNTER MEDICATION Apply 1 application topically daily as needed (pain). Cbd cream    [provider]  sertraline  (ZOLOFT ) 100 MG tablet Take 1 tablet (100 mg total) by mouth daily. Patient taking differently: Take 150 mg by mouth daily. 10/01/20   Pahwani, Velna SAUNDERS, MD  SPIRIVA  HANDIHALER 18 MCG inhalation capsule INHALE THE CONTENTS OF 1 CAPSULE VIA INHALATION DEVICE EVERY DAY Patient taking differently: Place 18 mcg into inhaler and inhale daily as needed (shortness of breath). 12/03/20   Antonetta Rollene BRAVO, MD  tamsulosin  (FLOMAX ) 0.4 MG CAPS capsule Take 1 capsule by mouth daily.    [provider]  traZODone  (DESYREL ) 100 MG tablet Take 100 mg by mouth at bedtime. 11/01/22   [provider]     Family History  Problem Relation Age of Onset   Lung disease Mother    Cancer Father     Social History   Socioeconomic History   Marital status: Widowed    Spouse name: Not on file   Number of children: Not on file   Years of education: Not on file   Highest education level: Not on file  Occupational History   Not on file  Tobacco Use   Smoking status: Former   Smokeless tobacco: Never  Vaping Use   Vaping status: Never Used  Substance and Sexual Activity   Alcohol use: No   Drug use: Never   Sexual activity: Not on file  Other Topics Concern   Not on file  Social History Narrative   Lives with house mate.   Social Drivers of Corporate investment banker Strain: Low Risk  (05/16/2024)   Received from [facility]   Overall Financial Resource Strain (CARDIA)    Difficulty of Paying Living Expenses: Not hard at all  Food Insecurity: No Food Insecurity (05/16/2024)   Received from [facility]   Hunger Vital Sign    Within the past 12 months, you worried that your food would run out before you got the money to buy more.: Never true    Within the past 12 months, the food you bought just didn't last and you didn't have money to get more.: Never true  Transportation Needs: No Transportation Needs (05/16/2024)   Received from [facility] - Transportation    In the past 12 months, has lack of transportation kept you from medical appointments or from getting medications?: No    Lack of Transportation (Non-Medical): No  Physical Activity: Not on file  Stress: Not on file  Social Connections: Not on file   Review of Systems: A 12 point ROS discussed and pertinent positives are indicated  in the HPI above.  All other systems are negative.  Review of Systems  Vital Signs: There were no vitals taken for this visit.  Advance Care Plan: The advanced care plan/surrogate decision maker was discussed at the time of visit and documented in the medical record.    Physical Exam  Imaging:   MR Lumbar Spine 08/28/24    IMPRESSION: 1. Acute/subacute wedge compression fractures at T12 (<25% height loss with marrow edema) and L2 (~30% central height loss with marrow edema and ~2 mm retropulsion), new from prior. 2. Chronic treated wedge compression fracture at L1 with vertebral augmentation. 3. Multilevel degenerative changes: L1-2 mild bilateral foraminal stenosis; L3-4 mild spinal canal stenosis; L4-5 left lateral recess narrowing; L5-S1 mild right foraminal stenosis.   Labs:  CBC: Recent Labs    01/04/24 1433 01/05/24 0441  WBC 6.6 7.1  HGB 13.3 12.8*  HCT 42.8 40.4  PLT 63* 66*    COAGS: No results for input(s): INR, APTT in the last 8760 hours.  BMP: Recent Labs    01/04/24 1433 01/05/24 0441 01/05/24 1303  NA 141 140 140  K 4.1 3.9 3.2*  CL 108 105 103  CO2 25 27 26   GLUCOSE 107* 110* 127*  BUN 28* 41* 44*   CALCIUM  9.0 8.6* 8.9  CREATININE 1.98* 2.76* 2.94*  GFRNONAA 33* 22* 21*    LIVER FUNCTION TESTS: Recent Labs    01/04/24 1433  BILITOT 1.1  AST 27  ALT 30  ALKPHOS 91  PROT 7.0  ALBUMIN 3.9    TUMOR MARKERS: No results for input(s): AFPTM, CEA, CA199, CHROMGRNA in the last 8760 hours.  Assessment and Plan:  83 year old male with a history of L1 compression fracture s/p kyphoplasty in 2022 with recent fall resulting in new compression fractures at T12 and L2.   Patient has suffered acute osteoporotic fracture of the T12 and L2 vertebra.   History and exam have demonstrated the following:  Acute/Subacute fracture by imaging dated 08/28/24, Pain on exam concordant with level of fracture, Failure of conservative therapy and pain refractory to narcotic pain mediation, Inability to tolerate narcotic pain medication due to side effects, and Significant disability on the [employer] Disability Questionnaire with ***/24 positive symptoms, reflecting significant impact/impairment of (ADLs)   ICD-10-CM Codes that Support Medical Necessity ([employer]Blog.at.[email or web address]?[email or web address])  M80.08XA    Age-related osteoporosis with current pathological fracture, vertebra(e), initial encounter for fracture   Plan:  T12 and L2 vertebral body augmentation with balloon kyphoplasty  Post-procedure disposition: outpatient [facility]   Medication holds: None  The patient has suffered a fracture of the T12 and L2 vertebral bodies. It is recommended that patients aged 50 years or older be evaluated for possible testing or treatment of osteoporosis. A copy of this consult report is sent to the patient's referring physician.  Advanced Care Plan: The patient has an Advanced Care Plan or Surrogate Decision Maker documented in the EMR   Thank you for this interesting consult.  I greatly enjoyed meeting Mario Patrick and look forward to  participating in their care.  A copy of this report was sent to the requesting provider on this date.  Ester Sides, MD Pager: [phone]    I spent a total of  40 Minutes   in face to face in clinical consultation, greater than 50% of which was counseling/coordinating care for compression fractures.

## 2024-09-12 ENCOUNTER — Ambulatory Visit
Admission: RE | Admit: 2024-09-12 | Discharge: 2024-09-12 | Disposition: A | Discharge status: Home / Self Care | Source: Ambulatory Visit | Attending: Physical Medicine & Rehabilitation | Admitting: Physical Medicine & Rehabilitation

## 2024-09-12 ENCOUNTER — Telehealth: Payer: Self-pay

## 2024-09-12 ENCOUNTER — Other Ambulatory Visit: Payer: Self-pay | Admitting: Interventional Radiology

## 2024-09-12 DIAGNOSIS — S22080A Wedge compression fracture of T11-T12 vertebra, initial encounter for closed fracture: Secondary | ICD-10-CM

## 2024-09-12 DIAGNOSIS — M4856XA Collapsed vertebra, not elsewhere classified, lumbar region, initial encounter for fracture: Secondary | ICD-10-CM

## 2024-09-12 DIAGNOSIS — M8008XA Age-related osteoporosis with current pathological fracture, vertebra(e), initial encounter for fracture: Secondary | ICD-10-CM

## 2024-09-12 HISTORY — PX: IR RADIOLOGIST EVAL & MGMT: IMG5224

## 2024-09-13 ENCOUNTER — Other Ambulatory Visit: Payer: Self-pay | Admitting: Interventional Radiology

## 2024-09-13 ENCOUNTER — Ambulatory Visit
Admission: RE | Admit: 2024-09-13 | Discharge: 2024-09-13 | Disposition: A | Discharge status: Home / Self Care | Source: Ambulatory Visit | Attending: Interventional Radiology | Admitting: Interventional Radiology

## 2024-09-13 DIAGNOSIS — M8008XA Age-related osteoporosis with current pathological fracture, vertebra(e), initial encounter for fracture: Secondary | ICD-10-CM

## 2024-09-13 HISTORY — PX: IR KYPHO LUMBAR INC FX REDUCE BONE BX UNI/BIL CANNULATION INC/IMAGING: IMG5519

## 2024-09-13 HISTORY — PX: IR KYPHO THORACIC WITH BONE BIOPSY: IMG5518

## 2024-09-13 MED ORDER — FENTANYL CITRATE (PF) 100 MCG/2ML IJ SOLN
INTRAMUSCULAR | Status: AC | PRN
  Administered 2024-09-13: 25 ug via INTRAVENOUS
  Administered 2024-09-13: 50 ug via INTRAVENOUS
  Administered 2024-09-13: 25 ug via INTRAVENOUS

## 2024-09-13 MED ORDER — CEFAZOLIN SODIUM-DEXTROSE 2-4 GM/100ML-% IV SOLN
2.0000 g | INTRAVENOUS | Status: AC
  Administered 2024-09-13: 2 g via INTRAVENOUS

## 2024-09-13 MED ORDER — ONDANSETRON HCL 4 MG/2ML IJ SOLN
8.0000 mg | Freq: Once | INTRAMUSCULAR | Status: AC
  Administered 2024-09-13: 8 mg via INTRAVENOUS

## 2024-09-13 MED ORDER — FENTANYL CITRATE (PF) 50 MCG/ML IJ SOSY: 25.0000 ug | PREFILLED_SYRINGE | INTRAMUSCULAR | Status: DC | PRN

## 2024-09-13 MED ORDER — ACETAMINOPHEN 10 MG/ML IV SOLN
1000.0000 mg | Freq: Once | INTRAVENOUS | Status: AC
Start: 2024-09-13 — End: 2024-09-13
  Administered 2024-09-13: 1000 mg via INTRAVENOUS

## 2024-09-13 MED ORDER — MIDAZOLAM HCL (PF) 2 MG/2ML IJ SOLN
INTRAMUSCULAR | Status: AC | PRN
  Administered 2024-09-13 (×2): 1 mg via INTRAVENOUS

## 2024-09-13 MED ORDER — SODIUM CHLORIDE 0.9 % IV SOLN: INTRAVENOUS | Status: DC

## 2024-09-13 MED ORDER — IOPAMIDOL (ISOVUE-M 200) INJECTION 41%
10.0000 mL | Freq: Once | INTRAMUSCULAR | Status: DC
Start: 2024-09-13 — End: 2024-09-14

## 2024-09-13 MED ORDER — LIDOCAINE HCL (PF) 1 % IJ SOLN
20.0000 mL | Freq: Once | INTRAMUSCULAR | Status: AC
  Administered 2024-09-13: 20 mL via INTRADERMAL

## 2024-09-13 MED ORDER — MIDAZOLAM HCL 5 MG/5ML IJ SOLN: INTRAMUSCULAR | Status: AC | PRN

## 2024-09-13 MED ORDER — MIDAZOLAM HCL (PF) 2 MG/2ML IJ SOLN: 1.0000 mg | INTRAMUSCULAR | Status: DC | PRN

## 2024-09-13 NOTE — Progress Notes (Signed)
 Pt back in nursing recovery area. Pt still drowsy from procedure but will wake up when spoken to. Pt follows commands, talks in complete sentences and has no complaints at this time. Pt will remain in nurses station until discharged by Radiologist.

## 2024-09-13 NOTE — Discharge Instructions (Signed)

## 2024-09-14 ENCOUNTER — Telehealth: Payer: Self-pay

## 2024-09-18 ENCOUNTER — Telehealth: Payer: Self-pay

## 2024-09-18 ENCOUNTER — Encounter: Payer: Self-pay | Admitting: Interventional Radiology

## 2024-09-18 DIAGNOSIS — Z9889 Other specified postprocedural states: Secondary | ICD-10-CM

## 2024-09-18 DIAGNOSIS — G8928 Other chronic postprocedural pain: Secondary | ICD-10-CM

## 2024-09-25 ENCOUNTER — Other Ambulatory Visit: Payer: Self-pay | Admitting: Interventional Radiology

## 2024-09-25 DIAGNOSIS — M8008XA Age-related osteoporosis with current pathological fracture, vertebra(e), initial encounter for fracture: Secondary | ICD-10-CM

## 2024-10-04 ENCOUNTER — Ambulatory Visit
Admission: RE | Admit: 2024-10-04 | Discharge: 2024-10-04 | Disposition: A | Discharge status: Home / Self Care | Source: Ambulatory Visit | Attending: Interventional Radiology | Admitting: Interventional Radiology

## 2024-10-04 DIAGNOSIS — M8008XA Age-related osteoporosis with current pathological fracture, vertebra(e), initial encounter for fracture: Secondary | ICD-10-CM

## 2024-10-04 HISTORY — PX: IR RADIOLOGIST EVAL & MGMT: IMG5224

## 2024-10-04 NOTE — Progress Notes (Signed)
 Chief Complaint: Patient was seen in consultation today for T12 and L2 osteoporotic compression fractures at the request of Turhan Chill K  Referring Physician(s): Letisha Yera K  History of Present Illness: Mario Patrick is a 83 y.o. 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  He is now post kyphoplasty on 09/13/24.  We spoke over video conference today and he was pleased to report he is now pain free.  His daughter looked at his back and confirms his incisions are well healed.   We discussed medical treatment for his osteoporosis - he would like to follow-up with his PCP for this.      Past Medical History:  Diagnosis Date   Anginal pain    Arthritis    Back ache    CHF (congestive heart failure) (HCC)    Chronic kidney disease    stage 3   COPD (chronic obstructive pulmonary disease) (HCC)    Coronary artery disease    recently checked carotid arteries, significant blockage   Depression    Dyspnea    Hypercholesteremia    Hypertension    Myocardial infarction [facility])     Past Surgical History:  Procedure Laterality Date   AORTIC VALVE REPLACEMENT     BACK SURGERY     CARDIAC SURGERY     CABG X4   COLONOSCOPY     IR KYPHO LUMBAR INC FX REDUCE BONE BX UNI/BIL CANNULATION INC/IMAGING  09/13/2024   IR KYPHO THORACIC WITH BONE BIOPSY  09/13/2024   IR RADIOLOGIST EVAL & MGMT  09/12/2024   IR RADIOLOGIST EVAL & MGMT  10/04/2024   KYPHOPLASTY N/A 07/16/2021   Procedure: L1 KYPHOPLASTY;  Surgeon: Kathlynn Sharper, MD;  Location: [facility] ORS;  Service: Orthopedics;  Laterality: N/A;    Allergies: Contrast media  [iodinated contrast media] and Aspirin   Medications: Prior to Admission medications   Medication Sig Start Date End Date Taking? Authorizing Provider  acetaminophen  (TYLENOL ) 500 MG tablet Take 500 mg by mouth every 6 (six) hours as needed for moderate pain.    [provider]  albuterol   (VENTOLIN  HFA) 108 (90 Base) MCG/ACT inhaler Inhale 1-2 puffs into the lungs every 6 (six) hours as needed for wheezing or shortness of breath. Patient taking differently: Inhale 2 puffs into the lungs every 6 (six) hours as needed for wheezing or shortness of breath. 03/06/21   Gwenn Kent, MD  atorvastatin  (LIPITOR) 80 MG tablet Take 1 tablet by mouth at bedtime. 12/22/22 01/04/24  [provider]  ezetimibe  (ZETIA ) 10 MG tablet Take 1 tablet (10 mg total) by mouth daily. 10/01/20   Pahwani, Velna SAUNDERS, MD  finasteride  (PROSCAR ) 5 MG tablet Take 1 tablet (5 mg total) by mouth daily. 11/02/22   Penne Knee, MD  fluticasone  (FLONASE ) 50 MCG/ACT nasal spray SHAKE LIQUID AND USE 2 SPRAYS IN EACH NOSTRIL EVERY DAY 04/20/22   [provider]  Fluticasone -Salmeterol (ADVAIR) 250-50 MCG/DOSE AEPB Inhale 1 puff into the lungs 2 (two) times daily. Patient taking differently: Inhale 1 puff into the lungs 2 (two) times daily as needed (shortness of breath). 10/01/20   Pahwani, Velna SAUNDERS, MD  ipratropium-albuterol  (DUONEB) 0.5-2.5 (3) MG/3ML SOLN Take 3 mLs by nebulization every 6 (six) hours as needed. Patient not taking: Reported on 01/04/2024 10/01/20   Vernon Velna SAUNDERS, MD  JARDIANCE 10 MG TABS tablet Take by mouth. 03/12/21   [provider]  lisinopril  (ZESTRIL ) 10 MG tablet Take 10 mg  by mouth daily. 01/15/23   [provider]  meclizine  (ANTIVERT ) 25 MG tablet Take 1 tablet (25 mg total) by mouth 3 (three) times daily as needed for dizziness. Patient not taking: Reported on 01/04/2024 10/01/20   Vernon Velna SAUNDERS, MD  methocarbamol  (ROBAXIN ) 500 MG tablet Take by mouth daily. As needed    [provider]  metoprolol  succinate (TOPROL -XL) 25 MG 24 hr tablet Take 12.5 mg by mouth daily. 08/28/21 01/04/24  [provider]  nitroGLYCERIN  (NITROSTAT ) 0.4 MG SL tablet Place 0.4 mg under the tongue every 5 (five) minutes as needed for chest pain.    [provider]  OVER THE COUNTER MEDICATION Apply 1 application topically daily as needed (pain). Cbd cream    [provider]  sertraline  (ZOLOFT ) 100 MG tablet Take 1 tablet (100 mg total) by mouth daily. Patient taking differently: Take 150 mg by mouth daily. 10/01/20   Pahwani, Velna SAUNDERS, MD  SPIRIVA  HANDIHALER 18 MCG inhalation capsule INHALE THE CONTENTS OF 1 CAPSULE VIA INHALATION DEVICE EVERY DAY Patient taking differently: Place 18 mcg into inhaler and inhale daily as needed (shortness of breath). 12/03/20   Antonetta Rollene BRAVO, MD  tamsulosin  (FLOMAX ) 0.4 MG CAPS capsule Take 1 capsule by mouth daily.    [provider]  traZODone  (DESYREL ) 100 MG tablet Take 100 mg by mouth at bedtime. 11/01/22   [provider]     Family History  Problem Relation Age of Onset   Lung disease Mother    Cancer Father     Social History   Socioeconomic History   Marital status: Widowed    Spouse name: Not on file   Number of children: Not on file   Years of education: Not on file   Highest education level: Not on file  Occupational History   Not on file  Tobacco Use   Smoking status: Former   Smokeless tobacco: Never  Vaping Use   Vaping status: Never Used  Substance and Sexual Activity   Alcohol use: No   Drug use: Never   Sexual activity: Not on file  Other Topics Concern   Not on file  Social History Narrative   Lives with house mate.   Social Drivers of Corporate Investment Banker Strain: Low Risk  (05/16/2024)   Received from [facility]   Overall Financial Resource Strain (CARDIA)    Difficulty of Paying Living Expenses: Not hard at all  Food Insecurity: No Food Insecurity (05/16/2024)   Received from [facility]   Hunger Vital Sign    Within the past 12 months, you worried that your food would run out before you got the money to buy more.: Never true    Within the past 12 months, the food you bought just didn't  last and you didn't have money to get more.: Never true  Transportation Needs: No Transportation Needs (05/16/2024)   Received from [facility] - Transportation    In the past 12 months, has lack of transportation kept you from medical appointments or from getting medications?: No    Lack of Transportation (Non-Medical): No  Physical Activity: Not on file  Stress: Not on file  Social Connections: Not on file    Review of Systems: A 12 point ROS discussed and pertinent positives are indicated in the HPI above.  All other systems are negative.  Review of Systems  Vital Signs: There were no  vitals taken for this visit.     Physical Exam Constitutional:      General: He is not in acute distress.    Appearance: Normal appearance. He is normal weight.  HENT:     Head: Normocephalic and atraumatic.  Eyes:     General: No scleral icterus. Pulmonary:     Effort: Pulmonary effort is normal.  Skin:    Comments: Incision sites well healed  Neurological:     Mental Status: He is alert.  Psychiatric:        Mood and Affect: Mood normal.        Behavior: Behavior normal.       Imaging: IR Radiologist Eval & Mgmt Result Date: 10/04/2024 EXAM: NEW PATIENT OFFICE VISIT CHIEF COMPLAINT: SEE NOTE IN EPIC HISTORY OF PRESENT ILLNESS: SEE NOTE IN EPIC REVIEW OF SYSTEMS: SEE NOTE IN EPIC PHYSICAL EXAMINATION: SEE NOTE IN EPIC ASSESSMENT AND PLAN: SEE NOTE IN EPIC Electronically Signed   By: Wilkie Lent M.D.   On: 10/04/2024 11:41   IR KYPHO THORACIC WITH BONE BIOPSY Result Date: 09/13/2024 CLINICAL DATA:  83 year old male with highly symptomatic osteoporotic compression fractures of the T12 and L2 vertebral bodies. He has suffered a prior osteoporotic compression fracture of L1 that responded very well to cement augmentation. Therefore, he presents today for 2 level cement augmentation. EXAM: FLUOROSCOPIC GUIDED KYPHOPLASTY OF THE T12 VERTEBRAL BODY  FLUOROSCOPIC GUIDED KYPHOPLASTY OF THE T12 VERTEBRAL BODY COMPARISON:  MRI lumbar spine 08/28/2024 MEDICATIONS: As antibiotic prophylaxis, 2 g Ancef  was ordered pre-procedure and administered intravenously by the Radiology nurse within 1 hour of incision. Additionally, 1 g of Tylenol  was administered intravenously by the Radiology nurse following the procedure. ANESTHESIA/SEDATION: Moderate (conscious) sedation was employed during this procedure. A total of Versed  2 mg and Fentanyl  100 mcg was administered intravenously by the Radiology nurse. Moderate Sedation Time: 30 minutes. The patient's level of consciousness and vital signs were monitored continuously by radiology nursing throughout the procedure under my direct supervision. FLUOROSCOPY TIME:  Radiation exposure index: 54.2 mGy, air kerma COMPLICATIONS: None immediate. PROCEDURE: The procedure, risks (including but not limited to bleeding, infection, organ damage), benefits, and alternatives were explained to the patient. Questions regarding the procedure were encouraged and answered. The patient understands and consents to the procedure. The patient was placed prone on the fluoroscopic table. The skin overlying the thoracolumbar region was then prepped and draped in the usual sterile fashion. Maximal barrier sterile technique was utilized including caps, mask, sterile gowns, sterile gloves, sterile drape, hand hygiene and skin antiseptic. Intravenous Fentanyl  and Versed  were administered as conscious sedation during continuous cardiorespiratory monitoring by the radiology RN. T12 The right pedicle at T12 was then infiltrated with 1% lidocaine  followed by the advancement of a Kyphon trocar needle through the left pedicle into the posterior one-third of the vertebral body. Subsequently, the osteo drill was advanced to the anterior third of the vertebral body. The osteo drill was retracted. Through the working cannula, a Kyphon inflatable bone tamp 15 x 2.5 was  advanced and positioned with the distal marker approximately 5 mm from the anterior aspect of the cortex. Appropriate positioning was confirmed on the AP projection. At this time, the balloon was expanded using contrast via a Kyphon inflation syringe device via micro tubing. Inflations were continued until there was near apposition with the superior end plate. L2 The right pedicle at L2 was then infiltrated with 1% lidocaine  followed by the advancement of a Kyphon trocar needle  through the left pedicle into the posterior one-third of the vertebral body. Subsequently, the osteo drill was advanced to the anterior third of the vertebral body. The osteo drill was retracted. Through the working cannula, a Kyphon inflatable bone tamp 15 x 2.5 was advanced and positioned with the distal marker approximately 5 mm from the anterior aspect of the cortex. Appropriate positioning was confirmed on the AP projection. At this time, the balloon was expanded using contrast via a Kyphon inflation syringe device via micro tubing. Inflations were continued until there was near apposition with the superior end plate. At this time, methylmethacrylate mixture was reconstituted in the Kyphon bone mixing device system. This was then loaded into the delivery mechanism, attached to the cement delivery system. The balloons were deflated and removed followed by the instillation of methylmethacrylate mixture with excellent filling in the AP and lateral projections. No extravasation was noted in the disk spaces or posteriorly into the spinal canal. No epidural venous contamination was seen. The working cannulae and the bone filler were then retrieved and removed. Hemostasis was achieved with manual compression. The patient tolerated the procedure well without immediate postprocedural complication. IMPRESSION: 1. Technically successful T12 vertebral body augmentation using balloon kyphoplasty. 2. Technically successful L2 vertebral body  augmentation using balloon kyphoplasty. 3. Per CMS PQRS reporting requirements (PQRS Measure 24): Given the patient's age of greater than 50 and the fracture site (hip, distal radius, or spine), the patient should be tested for osteoporosis using DXA, and the appropriate treatment considered based on the DXA results. Electronically Signed   By: Wilkie Lent M.D.   On: 09/13/2024 12:50   IR KYPHO LUMBAR INC FX REDUCE BONE BX UNI/BIL CANNULATION INC/IMAGING Result Date: 09/13/2024 CLINICAL DATA:  83 year old male with highly symptomatic osteoporotic compression fractures of the T12 and L2 vertebral bodies. He has suffered a prior osteoporotic compression fracture of L1 that responded very well to cement augmentation. Therefore, he presents today for 2 level cement augmentation. EXAM: FLUOROSCOPIC GUIDED KYPHOPLASTY OF THE T12 VERTEBRAL BODY FLUOROSCOPIC GUIDED KYPHOPLASTY OF THE T12 VERTEBRAL BODY COMPARISON:  MRI lumbar spine 08/28/2024 MEDICATIONS: As antibiotic prophylaxis, 2 g Ancef  was ordered pre-procedure and administered intravenously by the Radiology nurse within 1 hour of incision. Additionally, 1 g of Tylenol  was administered intravenously by the Radiology nurse following the procedure. ANESTHESIA/SEDATION: Moderate (conscious) sedation was employed during this procedure. A total of Versed  2 mg and Fentanyl  100 mcg was administered intravenously by the Radiology nurse. Moderate Sedation Time: 30 minutes. The patient's level of consciousness and vital signs were monitored continuously by radiology nursing throughout the procedure under my direct supervision. FLUOROSCOPY TIME:  Radiation exposure index: 54.2 mGy, air kerma COMPLICATIONS: None immediate. PROCEDURE: The procedure, risks (including but not limited to bleeding, infection, organ damage), benefits, and alternatives were explained to the patient. Questions regarding the procedure were encouraged and answered. The patient understands and  consents to the procedure. The patient was placed prone on the fluoroscopic table. The skin overlying the thoracolumbar region was then prepped and draped in the usual sterile fashion. Maximal barrier sterile technique was utilized including caps, mask, sterile gowns, sterile gloves, sterile drape, hand hygiene and skin antiseptic. Intravenous Fentanyl  and Versed  were administered as conscious sedation during continuous cardiorespiratory monitoring by the radiology RN. T12 The right pedicle at T12 was then infiltrated with 1% lidocaine  followed by the advancement of a Kyphon trocar needle through the left pedicle into the posterior one-third of the vertebral body. Subsequently, the osteo  drill was advanced to the anterior third of the vertebral body. The osteo drill was retracted. Through the working cannula, a Kyphon inflatable bone tamp 15 x 2.5 was advanced and positioned with the distal marker approximately 5 mm from the anterior aspect of the cortex. Appropriate positioning was confirmed on the AP projection. At this time, the balloon was expanded using contrast via a Kyphon inflation syringe device via micro tubing. Inflations were continued until there was near apposition with the superior end plate. L2 The right pedicle at L2 was then infiltrated with 1% lidocaine  followed by the advancement of a Kyphon trocar needle through the left pedicle into the posterior one-third of the vertebral body. Subsequently, the osteo drill was advanced to the anterior third of the vertebral body. The osteo drill was retracted. Through the working cannula, a Kyphon inflatable bone tamp 15 x 2.5 was advanced and positioned with the distal marker approximately 5 mm from the anterior aspect of the cortex. Appropriate positioning was confirmed on the AP projection. At this time, the balloon was expanded using contrast via a Kyphon inflation syringe device via micro tubing. Inflations were continued until there was near apposition  with the superior end plate. At this time, methylmethacrylate mixture was reconstituted in the Kyphon bone mixing device system. This was then loaded into the delivery mechanism, attached to the cement delivery system. The balloons were deflated and removed followed by the instillation of methylmethacrylate mixture with excellent filling in the AP and lateral projections. No extravasation was noted in the disk spaces or posteriorly into the spinal canal. No epidural venous contamination was seen. The working cannulae and the bone filler were then retrieved and removed. Hemostasis was achieved with manual compression. The patient tolerated the procedure well without immediate postprocedural complication. IMPRESSION: 1. Technically successful T12 vertebral body augmentation using balloon kyphoplasty. 2. Technically successful L2 vertebral body augmentation using balloon kyphoplasty. 3. Per CMS PQRS reporting requirements (PQRS Measure 24): Given the patient's age of greater than 50 and the fracture site (hip, distal radius, or spine), the patient should be tested for osteoporosis using DXA, and the appropriate treatment considered based on the DXA results. Electronically Signed   By: Wilkie Lent M.D.   On: 09/13/2024 12:50   IR Radiologist Eval & Mgmt Result Date: 09/12/2024 EXAM: NEW PATIENT OFFICE VISIT CHIEF COMPLAINT: See Epic note. HISTORY OF PRESENT ILLNESS: See Epic note. REVIEW OF SYSTEMS: See Epic note. PHYSICAL EXAMINATION: See Epic note. ASSESSMENT AND PLAN: See Epic note. Ester Sides, MD [facility] Electronically Signed   By: Ester Sides M.D.   On: 09/12/2024 10:43    Labs:  CBC: Recent Labs    01/04/24 1433 01/05/24 0441  WBC 6.6 7.1  HGB 13.3 12.8*  HCT 42.8 40.4  PLT 63* 66*    COAGS: No results for input(s): INR, APTT in the last 8760 hours.  BMP: Recent Labs    01/04/24 1433 01/05/24 0441 01/05/24 1303   NA 141 140 140  K 4.1 3.9 3.2*  CL 108 105 103  CO2 25 27 26   GLUCOSE 107* 110* 127*  BUN 28* 41* 44*  CALCIUM  9.0 8.6* 8.9  CREATININE 1.98* 2.76* 2.94*  GFRNONAA 33* 22* 21*    LIVER FUNCTION TESTS: Recent Labs    01/04/24 1433  BILITOT 1.1  AST 27  ALT 30  ALKPHOS 91  PROT 7.0  ALBUMIN 3.9    TUMOR MARKERS: No results for input(s): AFPTM, CEA,  CA199, CHROMGRNA in the last 8760 hours.  Assessment and Plan:  Doing great and pain free.  No further scheduled follow-up.    Electronically Signed: Wilkie MARLA Lent 10/04/2024, 12:57 PM   This encounter was conducted via the [employer] providing interactive audio and visual communication.  The patient provided verbal consent to conduct a virtual appointment.  The patient was located at their primary residence during this encounter.   I spent a total of   15 Minutes in face to face in clinical consultation, greater than 50% of which was counseling/coordinating care for T12 and L2 osteoporotic compression fractures, follow up after kyphoplasty

## 2024-12-12 ENCOUNTER — Inpatient Hospital Stay: Admitting: Anesthesiology

## 2024-12-12 ENCOUNTER — Encounter: Payer: Self-pay | Admitting: Internal Medicine

## 2024-12-12 ENCOUNTER — Inpatient Hospital Stay

## 2024-12-12 ENCOUNTER — Emergency Department

## 2024-12-12 ENCOUNTER — Encounter
Admission: EM | Disposition: A | Discharge status: Skilled Nursing Facility | Payer: Self-pay | Source: Home / Self Care | Attending: Osteopathic Medicine

## 2024-12-12 ENCOUNTER — Other Ambulatory Visit: Payer: Self-pay

## 2024-12-12 ENCOUNTER — Inpatient Hospital Stay
Admission: EM | Admit: 2024-12-12 | Discharge: 2024-12-21 | DRG: 481 | Disposition: A | Discharge status: Skilled Nursing Facility | Attending: Hospitalist | Admitting: Hospitalist

## 2024-12-12 DIAGNOSIS — J449 Chronic obstructive pulmonary disease, unspecified: Secondary | ICD-10-CM | POA: Diagnosis present

## 2024-12-12 DIAGNOSIS — Z7951 Long term (current) use of inhaled steroids: Secondary | ICD-10-CM

## 2024-12-12 DIAGNOSIS — I959 Hypotension, unspecified: Secondary | ICD-10-CM | POA: Diagnosis not present

## 2024-12-12 DIAGNOSIS — S72001A Fracture of unspecified part of neck of right femur, initial encounter for closed fracture: Secondary | ICD-10-CM

## 2024-12-12 DIAGNOSIS — Z79899 Other long term (current) drug therapy: Secondary | ICD-10-CM

## 2024-12-12 DIAGNOSIS — I130 Hypertensive heart and chronic kidney disease with heart failure and stage 1 through stage 4 chronic kidney disease, or unspecified chronic kidney disease: Secondary | ICD-10-CM | POA: Diagnosis present

## 2024-12-12 DIAGNOSIS — Y92008 Other place in unspecified non-institutional (private) residence as the place of occurrence of the external cause: Secondary | ICD-10-CM

## 2024-12-12 DIAGNOSIS — E1151 Type 2 diabetes mellitus with diabetic peripheral angiopathy without gangrene: Secondary | ICD-10-CM | POA: Diagnosis present

## 2024-12-12 DIAGNOSIS — Z87891 Personal history of nicotine dependence: Secondary | ICD-10-CM

## 2024-12-12 DIAGNOSIS — D62 Acute posthemorrhagic anemia: Secondary | ICD-10-CM | POA: Diagnosis not present

## 2024-12-12 DIAGNOSIS — F05 Delirium due to known physiological condition: Secondary | ICD-10-CM | POA: Diagnosis not present

## 2024-12-12 DIAGNOSIS — I5032 Chronic diastolic (congestive) heart failure: Secondary | ICD-10-CM | POA: Diagnosis present

## 2024-12-12 DIAGNOSIS — Z952 Presence of prosthetic heart valve: Secondary | ICD-10-CM

## 2024-12-12 DIAGNOSIS — Z66 Do not resuscitate: Secondary | ICD-10-CM | POA: Diagnosis present

## 2024-12-12 DIAGNOSIS — I2510 Atherosclerotic heart disease of native coronary artery without angina pectoris: Secondary | ICD-10-CM | POA: Diagnosis present

## 2024-12-12 DIAGNOSIS — F419 Anxiety disorder, unspecified: Secondary | ICD-10-CM | POA: Diagnosis present

## 2024-12-12 DIAGNOSIS — F32A Depression, unspecified: Secondary | ICD-10-CM | POA: Diagnosis present

## 2024-12-12 DIAGNOSIS — S72009A Fracture of unspecified part of neck of unspecified femur, initial encounter for closed fracture: Secondary | ICD-10-CM | POA: Diagnosis present

## 2024-12-12 DIAGNOSIS — N400 Enlarged prostate without lower urinary tract symptoms: Secondary | ICD-10-CM | POA: Diagnosis present

## 2024-12-12 DIAGNOSIS — D631 Anemia in chronic kidney disease: Secondary | ICD-10-CM | POA: Diagnosis present

## 2024-12-12 DIAGNOSIS — Z951 Presence of aortocoronary bypass graft: Secondary | ICD-10-CM

## 2024-12-12 DIAGNOSIS — N1832 Chronic kidney disease, stage 3b: Secondary | ICD-10-CM | POA: Diagnosis present

## 2024-12-12 DIAGNOSIS — E7800 Pure hypercholesterolemia, unspecified: Secondary | ICD-10-CM | POA: Diagnosis present

## 2024-12-12 DIAGNOSIS — S72002A Fracture of unspecified part of neck of left femur, initial encounter for closed fracture: Principal | ICD-10-CM

## 2024-12-12 DIAGNOSIS — Z886 Allergy status to analgesic agent status: Secondary | ICD-10-CM

## 2024-12-12 DIAGNOSIS — S72142A Displaced intertrochanteric fracture of left femur, initial encounter for closed fracture: Principal | ICD-10-CM | POA: Diagnosis present

## 2024-12-12 DIAGNOSIS — E1122 Type 2 diabetes mellitus with diabetic chronic kidney disease: Secondary | ICD-10-CM | POA: Diagnosis present

## 2024-12-12 DIAGNOSIS — Z681 Body mass index (BMI) 19 or less, adult: Secondary | ICD-10-CM

## 2024-12-12 DIAGNOSIS — I252 Old myocardial infarction: Secondary | ICD-10-CM

## 2024-12-12 DIAGNOSIS — W010XXA Fall on same level from slipping, tripping and stumbling without subsequent striking against object, initial encounter: Secondary | ICD-10-CM | POA: Diagnosis present

## 2024-12-12 DIAGNOSIS — N189 Chronic kidney disease, unspecified: Secondary | ICD-10-CM

## 2024-12-12 DIAGNOSIS — E440 Moderate protein-calorie malnutrition: Secondary | ICD-10-CM | POA: Diagnosis present

## 2024-12-12 DIAGNOSIS — D696 Thrombocytopenia, unspecified: Secondary | ICD-10-CM | POA: Diagnosis present

## 2024-12-12 DIAGNOSIS — I4891 Unspecified atrial fibrillation: Secondary | ICD-10-CM | POA: Diagnosis present

## 2024-12-12 HISTORY — PX: INTRAMEDULLARY (IM) NAIL INTERTROCHANTERIC: SHX5875

## 2024-12-12 LAB — BASIC METABOLIC PANEL WITH GFR
Anion gap: 9 (ref 5–15)
BUN: 26 mg/dL — ABNORMAL HIGH (ref 8–23)
CO2: 25 mmol/L (ref 22–32)
Calcium: 8.8 mg/dL — ABNORMAL LOW (ref 8.9–10.3)
Chloride: 107 mmol/L (ref 98–111)
Creatinine, Ser: 1.79 mg/dL — ABNORMAL HIGH (ref 0.61–1.24)
GFR, Estimated: 37 mL/min — ABNORMAL LOW
Glucose, Bld: 119 mg/dL — ABNORMAL HIGH (ref 70–99)
Potassium: 4.1 mmol/L (ref 3.5–5.1)
Sodium: 141 mmol/L (ref 135–145)

## 2024-12-12 LAB — CBC WITH DIFFERENTIAL/PLATELET
Abs Immature Granulocytes: 0.01 K/uL (ref 0.00–0.07)
Basophils Absolute: 0 K/uL (ref 0.0–0.1)
Basophils Relative: 0 %
Eosinophils Absolute: 0.1 K/uL (ref 0.0–0.5)
Eosinophils Relative: 1 %
HCT: 33.9 % — ABNORMAL LOW (ref 39.0–52.0)
Hemoglobin: 10.7 g/dL — ABNORMAL LOW (ref 13.0–17.0)
Immature Granulocytes: 0 %
Lymphocytes Relative: 20 %
Lymphs Abs: 0.9 K/uL (ref 0.7–4.0)
MCH: 29.6 pg (ref 26.0–34.0)
MCHC: 31.6 g/dL (ref 30.0–36.0)
MCV: 93.9 fL (ref 80.0–100.0)
Monocytes Absolute: 0.3 K/uL (ref 0.1–1.0)
Monocytes Relative: 7 %
Neutro Abs: 3.2 K/uL (ref 1.7–7.7)
Neutrophils Relative %: 72 %
Platelets: 60 K/uL — ABNORMAL LOW (ref 150–400)
RBC: 3.61 MIL/uL — ABNORMAL LOW (ref 4.22–5.81)
RDW: 14 % (ref 11.5–15.5)
Smear Review: NORMAL
WBC: 4.5 K/uL (ref 4.0–10.5)
nRBC: 0 % (ref 0.0–0.2)

## 2024-12-12 LAB — PROTIME-INR
INR: 1.2 (ref 0.8–1.2)
Prothrombin Time: 15.5 s — ABNORMAL HIGH (ref 11.4–15.2)

## 2024-12-12 LAB — SAMPLE TO BLOOD BANK

## 2024-12-12 LAB — SURGICAL PCR SCREEN
MRSA, PCR: NEGATIVE
Staphylococcus aureus: NEGATIVE

## 2024-12-12 MED ORDER — 0.9 % SODIUM CHLORIDE (POUR BTL) OPTIME
TOPICAL | Status: DC | PRN
  Administered 2024-12-12: 500 mL

## 2024-12-12 MED ORDER — ALBUTEROL SULFATE (2.5 MG/3ML) 0.083% IN NEBU
2.5000 mg | INHALATION_SOLUTION | Freq: Four times a day (QID) | RESPIRATORY_TRACT | Status: DC | PRN
  Administered 2024-12-17: 2.5 mg via RESPIRATORY_TRACT
  Filled 2024-12-12: qty 3

## 2024-12-12 MED ORDER — METOPROLOL SUCCINATE ER 25 MG PO TB24: 12.5000 mg | ORAL_TABLET | Freq: Every day | ORAL | Status: DC

## 2024-12-12 MED ORDER — ZOLPIDEM TARTRATE 5 MG PO TABS
5.0000 mg | ORAL_TABLET | Freq: Every evening | ORAL | Status: DC | PRN
  Administered 2024-12-15: 5 mg via ORAL
  Filled 2024-12-12: qty 1

## 2024-12-12 MED ORDER — METHOCARBAMOL 1000 MG/10ML IJ SOLN
500.0000 mg | Freq: Four times a day (QID) | INTRAMUSCULAR | Status: DC | PRN
  Administered 2024-12-18 – 2024-12-19 (×2): 500 mg via INTRAVENOUS
  Filled 2024-12-12 (×2): qty 10

## 2024-12-12 MED ORDER — LIDOCAINE HCL (CARDIAC) PF 100 MG/5ML IV SOSY
PREFILLED_SYRINGE | INTRAVENOUS | Status: DC | PRN
  Administered 2024-12-12: 60 mg via INTRAVENOUS

## 2024-12-12 MED ORDER — FLEET ENEMA RE ENEM: 1.0000 | ENEMA | Freq: Once | RECTAL | Status: DC | PRN

## 2024-12-12 MED ORDER — PROPOFOL 10 MG/ML IV BOLUS
INTRAVENOUS | Status: DC | PRN
  Administered 2024-12-12: 80 mg via INTRAVENOUS

## 2024-12-12 MED ORDER — ACETAMINOPHEN 500 MG PO TABS
1000.0000 mg | ORAL_TABLET | Freq: Three times a day (TID) | ORAL | Status: DC
  Administered 2024-12-12 – 2024-12-19 (×19): 1000 mg via ORAL
  Filled 2024-12-12 (×21): qty 2

## 2024-12-12 MED ORDER — DOCUSATE SODIUM 100 MG PO CAPS
100.0000 mg | ORAL_CAPSULE | Freq: Two times a day (BID) | ORAL | Status: DC
  Administered 2024-12-12 – 2024-12-21 (×18): 100 mg via ORAL
  Filled 2024-12-12 (×18): qty 1

## 2024-12-12 MED ORDER — LACTATED RINGERS IV SOLN: INTRAVENOUS | Status: DC | PRN

## 2024-12-12 MED ORDER — ADULT MULTIVITAMIN W/MINERALS CH: 1.0000 | ORAL_TABLET | Freq: Every day | ORAL | Status: DC

## 2024-12-12 MED ORDER — MORPHINE SULFATE (PF) 4 MG/ML IV SOLN
4.0000 mg | Freq: Once | INTRAVENOUS | Status: DC
  Filled 2024-12-12: qty 1

## 2024-12-12 MED ORDER — PROPOFOL 10 MG/ML IV BOLUS
INTRAVENOUS | Status: AC
  Filled 2024-12-12: qty 20

## 2024-12-12 MED ORDER — SERTRALINE HCL 50 MG PO TABS
150.0000 mg | ORAL_TABLET | Freq: Every day | ORAL | Status: DC
  Administered 2024-12-12 – 2024-12-21 (×10): 150 mg via ORAL
  Filled 2024-12-12 (×10): qty 3

## 2024-12-12 MED ORDER — METHOCARBAMOL 500 MG PO TABS
500.0000 mg | ORAL_TABLET | Freq: Four times a day (QID) | ORAL | Status: DC | PRN
  Administered 2024-12-19 – 2024-12-21 (×2): 500 mg via ORAL
  Filled 2024-12-12 (×2): qty 1

## 2024-12-12 MED ORDER — SODIUM CHLORIDE 0.9 % IV SOLN: INTRAVENOUS | Status: DC

## 2024-12-12 MED ORDER — ONDANSETRON HCL 4 MG/2ML IJ SOLN: 4.0000 mg | Freq: Four times a day (QID) | INTRAMUSCULAR | Status: DC | PRN

## 2024-12-12 MED ORDER — HYDROCODONE-ACETAMINOPHEN 5-325 MG PO TABS: 1.0000 | ORAL_TABLET | Freq: Four times a day (QID) | ORAL | Status: DC | PRN

## 2024-12-12 MED ORDER — FINASTERIDE 5 MG PO TABS: 5.0000 mg | ORAL_TABLET | Freq: Every day | ORAL | Status: DC

## 2024-12-12 MED ORDER — ENSURE PLUS HIGH PROTEIN PO LIQD
237.0000 mL | Freq: Two times a day (BID) | ORAL | Status: AC
  Administered 2024-12-13 – 2024-12-16 (×2): 237 mL via ORAL

## 2024-12-12 MED ORDER — HYDRALAZINE HCL 20 MG/ML IJ SOLN: 5.0000 mg | Freq: Four times a day (QID) | INTRAMUSCULAR | Status: DC | PRN

## 2024-12-12 MED ORDER — FLUTICASONE FUROATE-VILANTEROL 200-25 MCG/ACT IN AEPB
1.0000 | INHALATION_SPRAY | Freq: Every day | RESPIRATORY_TRACT | Status: DC
  Filled 2024-12-12: qty 28

## 2024-12-12 MED ORDER — BUDESON-GLYCOPYRROL-FORMOTEROL 160-9-4.8 MCG/ACT IN AERO
2.0000 | INHALATION_SPRAY | Freq: Two times a day (BID) | RESPIRATORY_TRACT | Status: DC
  Administered 2024-12-13 – 2024-12-21 (×17): 2 via RESPIRATORY_TRACT
  Filled 2024-12-12 (×2): qty 5.9

## 2024-12-12 MED ORDER — TAMSULOSIN HCL 0.4 MG PO CAPS
0.4000 mg | ORAL_CAPSULE | Freq: Every day | ORAL | Status: DC
  Administered 2024-12-12 – 2024-12-21 (×10): 0.4 mg via ORAL
  Filled 2024-12-12 (×10): qty 1

## 2024-12-12 MED ORDER — MUPIROCIN 2 % EX OINT
1.0000 | TOPICAL_OINTMENT | Freq: Two times a day (BID) | CUTANEOUS | Status: DC
  Filled 2024-12-12: qty 22

## 2024-12-12 MED ORDER — MORPHINE SULFATE (PF) 2 MG/ML IV SOLN
2.0000 mg | Freq: Once | INTRAVENOUS | Status: AC
  Administered 2024-12-12: 2 mg via INTRAVENOUS
  Filled 2024-12-12: qty 1

## 2024-12-12 MED ORDER — SENNOSIDES-DOCUSATE SODIUM 8.6-50 MG PO TABS
1.0000 | ORAL_TABLET | Freq: Every evening | ORAL | Status: DC | PRN
  Administered 2024-12-13 – 2024-12-20 (×4): 1 via ORAL
  Filled 2024-12-12 (×4): qty 1

## 2024-12-12 MED ORDER — METOCLOPRAMIDE HCL 5 MG PO TABS: 5.0000 mg | ORAL_TABLET | Freq: Three times a day (TID) | ORAL | Status: DC | PRN

## 2024-12-12 MED ORDER — ATORVASTATIN CALCIUM 80 MG PO TABS
80.0000 mg | ORAL_TABLET | Freq: Every day | ORAL | Status: DC
  Administered 2024-12-12 – 2024-12-20 (×9): 80 mg via ORAL
  Filled 2024-12-12 (×9): qty 1

## 2024-12-12 MED ORDER — TRANEXAMIC ACID-NACL 1000-0.7 MG/100ML-% IV SOLN
1000.0000 mg | INTRAVENOUS | Status: AC
  Administered 2024-12-12: 1000 mg via INTRAVENOUS
  Filled 2024-12-12: qty 100

## 2024-12-12 MED ORDER — BISACODYL 10 MG RE SUPP
10.0000 mg | Freq: Every day | RECTAL | Status: DC | PRN
  Administered 2024-12-17: 10 mg via RECTAL
  Filled 2024-12-12: qty 1

## 2024-12-12 MED ORDER — CEFAZOLIN SODIUM-DEXTROSE 2-4 GM/100ML-% IV SOLN
2.0000 g | INTRAVENOUS | Status: AC
  Administered 2024-12-12: 2 g via INTRAVENOUS
  Filled 2024-12-12: qty 100

## 2024-12-12 MED ORDER — SUCCINYLCHOLINE CHLORIDE 200 MG/10ML IV SOSY
PREFILLED_SYRINGE | INTRAVENOUS | Status: DC | PRN
  Administered 2024-12-12: 100 mg via INTRAVENOUS

## 2024-12-12 MED ORDER — TIOTROPIUM BROMIDE 18 MCG IN CAPS: 18.0000 ug | ORAL_CAPSULE | Freq: Every day | RESPIRATORY_TRACT | Status: DC | PRN

## 2024-12-12 MED ORDER — PHENYLEPHRINE 80 MCG/ML (10ML) SYRINGE FOR IV PUSH (FOR BLOOD PRESSURE SUPPORT)
PREFILLED_SYRINGE | INTRAVENOUS | Status: DC | PRN
  Administered 2024-12-12: 120 ug via INTRAVENOUS
  Administered 2024-12-12 (×2): 80 ug via INTRAVENOUS

## 2024-12-12 MED ORDER — FENTANYL CITRATE (PF) 100 MCG/2ML IJ SOLN
25.0000 ug | INTRAMUSCULAR | Status: DC | PRN
  Administered 2024-12-12: 25 ug via INTRAVENOUS

## 2024-12-12 MED ORDER — BISACODYL 5 MG PO TBEC: 5.0000 mg | DELAYED_RELEASE_TABLET | Freq: Every day | ORAL | Status: DC | PRN

## 2024-12-12 MED ORDER — FENTANYL CITRATE (PF) 100 MCG/2ML IJ SOLN
INTRAMUSCULAR | Status: AC
  Filled 2024-12-12: qty 2

## 2024-12-12 MED ORDER — MORPHINE SULFATE (PF) 4 MG/ML IV SOLN: 4.0000 mg | Freq: Once | INTRAVENOUS | Status: DC

## 2024-12-12 MED ORDER — TRAMADOL HCL 50 MG PO TABS
50.0000 mg | ORAL_TABLET | Freq: Four times a day (QID) | ORAL | Status: DC | PRN
  Administered 2024-12-20 – 2024-12-21 (×2): 50 mg via ORAL
  Filled 2024-12-12 (×2): qty 1

## 2024-12-12 MED ORDER — LISINOPRIL 10 MG PO TABS
10.0000 mg | ORAL_TABLET | Freq: Every day | ORAL | Status: DC
  Administered 2024-12-13: 10 mg via ORAL
  Filled 2024-12-12 (×2): qty 1

## 2024-12-12 MED ORDER — HYDROMORPHONE HCL 1 MG/ML IJ SOLN
0.5000 mg | INTRAMUSCULAR | Status: DC | PRN
  Administered 2024-12-12: 0.5 mg via INTRAVENOUS
  Filled 2024-12-12: qty 0.5

## 2024-12-12 MED ORDER — SENNOSIDES-DOCUSATE SODIUM 8.6-50 MG PO TABS: 1.0000 | ORAL_TABLET | Freq: Every evening | ORAL | Status: DC | PRN

## 2024-12-12 MED ORDER — EPHEDRINE SULFATE-NACL 50-0.9 MG/10ML-% IV SOSY
PREFILLED_SYRINGE | INTRAVENOUS | Status: DC | PRN
  Administered 2024-12-12: 15 mg via INTRAVENOUS

## 2024-12-12 MED ORDER — ONDANSETRON HCL 4 MG/2ML IJ SOLN
4.0000 mg | Freq: Four times a day (QID) | INTRAMUSCULAR | Status: DC | PRN
  Filled 2024-12-12: qty 2

## 2024-12-12 MED ORDER — METOPROLOL TARTRATE 25 MG PO TABS
12.5000 mg | ORAL_TABLET | Freq: Two times a day (BID) | ORAL | Status: DC
  Administered 2024-12-12 – 2024-12-21 (×16): 12.5 mg via ORAL
  Filled 2024-12-12 (×17): qty 1

## 2024-12-12 MED ORDER — TRAZODONE HCL 100 MG PO TABS
100.0000 mg | ORAL_TABLET | Freq: Every day | ORAL | Status: DC
  Administered 2024-12-12 – 2024-12-20 (×9): 100 mg via ORAL
  Filled 2024-12-12 (×9): qty 1

## 2024-12-12 MED ORDER — BUPIVACAINE HCL (PF) 0.5 % IJ SOLN
INTRAMUSCULAR | Status: AC
  Filled 2024-12-12: qty 30

## 2024-12-12 MED ORDER — METOCLOPRAMIDE HCL 5 MG/ML IJ SOLN: 5.0000 mg | Freq: Three times a day (TID) | INTRAMUSCULAR | Status: DC | PRN

## 2024-12-12 MED ORDER — ENOXAPARIN SODIUM 30 MG/0.3ML IJ SOSY
30.0000 mg | PREFILLED_SYRINGE | INTRAMUSCULAR | Status: DC
  Administered 2024-12-13 – 2024-12-21 (×9): 30 mg via SUBCUTANEOUS
  Filled 2024-12-12 (×9): qty 0.3

## 2024-12-12 MED ORDER — BUPIVACAINE LIPOSOME 1.3 % IJ SUSP
INTRAMUSCULAR | Status: AC
  Filled 2024-12-12: qty 20

## 2024-12-12 MED ORDER — FENTANYL CITRATE (PF) 100 MCG/2ML IJ SOLN
INTRAMUSCULAR | Status: DC | PRN
  Administered 2024-12-12 (×2): 50 ug via INTRAVENOUS

## 2024-12-12 MED ORDER — ROCURONIUM BROMIDE 10 MG/ML (PF) SYRINGE
PREFILLED_SYRINGE | INTRAVENOUS | Status: AC
  Filled 2024-12-12: qty 10

## 2024-12-12 MED ORDER — LIDOCAINE HCL (PF) 2 % IJ SOLN
INTRAMUSCULAR | Status: AC
  Filled 2024-12-12: qty 5

## 2024-12-12 MED ORDER — ONDANSETRON HCL 4 MG PO TABS: 4.0000 mg | ORAL_TABLET | Freq: Four times a day (QID) | ORAL | Status: DC | PRN

## 2024-12-12 MED ORDER — FLUTICASONE PROPIONATE 50 MCG/ACT NA SUSP: 1.0000 | Freq: Every day | NASAL | Status: DC

## 2024-12-12 MED ORDER — DROPERIDOL 2.5 MG/ML IJ SOLN: 0.6250 mg | Freq: Once | INTRAMUSCULAR | Status: DC | PRN

## 2024-12-12 MED ORDER — HEPARIN SODIUM (PORCINE) 5000 UNIT/ML IJ SOLN: 5000.0000 [IU] | Freq: Two times a day (BID) | INTRAMUSCULAR | Status: DC

## 2024-12-12 MED ORDER — OXYCODONE HCL 5 MG PO TABS
5.0000 mg | ORAL_TABLET | ORAL | Status: DC | PRN
  Administered 2024-12-13: 5 mg via ORAL
  Administered 2024-12-13: 10 mg via ORAL
  Administered 2024-12-13: 5 mg via ORAL
  Administered 2024-12-17: 10 mg via ORAL
  Administered 2024-12-18 – 2024-12-19 (×2): 5 mg via ORAL
  Administered 2024-12-19 – 2024-12-21 (×7): 10 mg via ORAL
  Filled 2024-12-12 (×2): qty 1
  Filled 2024-12-12 (×4): qty 2
  Filled 2024-12-12 (×2): qty 1
  Filled 2024-12-12 (×5): qty 2

## 2024-12-12 MED ORDER — HYDROMORPHONE HCL 1 MG/ML IJ SOLN: 0.2000 mg | INTRAMUSCULAR | Status: DC | PRN

## 2024-12-12 MED ORDER — CEFAZOLIN SODIUM-DEXTROSE 1-4 GM/50ML-% IV SOLN
1.0000 g | Freq: Four times a day (QID) | INTRAVENOUS | Status: AC
  Administered 2024-12-12 – 2024-12-13 (×3): 1 g via INTRAVENOUS
  Filled 2024-12-12 (×3): qty 50

## 2024-12-12 MED ORDER — EZETIMIBE 10 MG PO TABS
10.0000 mg | ORAL_TABLET | Freq: Every day | ORAL | Status: DC
  Administered 2024-12-13 – 2024-12-21 (×9): 10 mg via ORAL
  Filled 2024-12-12 (×9): qty 1

## 2024-12-12 MED ORDER — OXYCODONE HCL 5 MG PO TABS
2.5000 mg | ORAL_TABLET | ORAL | Status: DC | PRN
  Administered 2024-12-12 – 2024-12-18 (×3): 5 mg via ORAL
  Filled 2024-12-12 (×3): qty 1

## 2024-12-12 MED ORDER — METHOCARBAMOL 500 MG PO TABS: 500.0000 mg | ORAL_TABLET | Freq: Three times a day (TID) | ORAL | Status: DC | PRN

## 2024-12-12 MED ORDER — BUPIVACAINE LIPOSOME 1.3 % IJ SUSP
INTRAMUSCULAR | Status: DC | PRN
  Administered 2024-12-12: 40 mL via INTRAMUSCULAR

## 2024-12-12 NOTE — H&P (Signed)
 H&P reviewed. No significant changes noted.

## 2024-12-12 NOTE — Progress Notes (Signed)
 Initial Nutrition Assessment  DOCUMENTATION CODES:   Not applicable  INTERVENTION:   -Once diet is advanced, add:   -Ensure Plus High Protein po BID, each supplement provides 350 kcal and 20 grams of protein  -MVI with minerals daily -RD provided  Low Sodium Nutrition Therapy handout from [facility] Nutrition Care Manual; attached to AVS/ Discharge summary   NUTRITION DIAGNOSIS:   Increased nutrient needs related to post-op healing as evidenced by estimated needs.  GOAL:   Patient will meet greater than or equal to 90% of their needs  MONITOR:   PO intake, Supplement acceptance, Diet advancement  REASON FOR ASSESSMENT:   Consult Assessment of nutrition requirement/status, Hip fracture protocol  ASSESSMENT:   84 y.o. male with medical history significant of CAD status post CABG, HTN, chronic HFpEF, CKD stage IIIb, BPH, carotid stenosis, chronic thrombocytopenia, COPD, anxiety/depression, presented with mechanical fall and right hip pain.  Patient admitted with right intertochanteric hip fracture s/p fall.   Per RN notes, patient is scheduled for surgery around 1600. He is currently NPO for procedure.   Patient receiving nursing care at time of visit and preparing to transition down to OR at time of visit. RD unable to obtain further nutrition-related history or complete nutrition-focused physical exam at this time.    Patient with increased needs for post-operative healing and wound benefit from addition of oral nutrition supplements.   Reviewed weight history. PerCareEverywhere, weight was 57.2 kg on 08/28/24. He has experienced a 0.1% weight loss over the past 2 months, which is not significant for time frame.   Medications reviewed.   Labs reviewed.    Diet Order:   Diet Order             Diet NPO time specified  Diet effective now                   EDUCATION NEEDS:   Not appropriate for education at this time  Skin:  Skin Assessment: Reviewed RN  Assessment  Last BM:  Unknown  Height:   Ht Readings from Last 1 Encounters:  12/12/24 5' 7 (1.702 m)    Weight:   Wt Readings from Last 1 Encounters:  12/12/24 56.7 kg    Ideal Body Weight:  67.3 kg  BMI:  Body mass index is 19.58 kg/m.  Estimated Nutritional Needs:   Kcal:  1700-1900  Protein:  90-105 grams  Fluid:  1.7-1.9 L    Margery ORN, RD, LDN, CDCES Registered Dietitian III Certified Diabetes Care and Education Specialist If unable to reach this RD, please use RD Inpatient group chat on secure chat between hours of 8am-4 pm daily

## 2024-12-12 NOTE — ED Notes (Signed)
 Lab made aware of add-ons and will start running them.

## 2024-12-12 NOTE — H&P (Addendum)
 " History and Physical    Mario Patrick FMW:[id number] DOB: November 28, 1940 DOA: 12/12/2024  PCP: Zachary Idelia LABOR, MD (Confirm with patient/family/NH records and if not entered, this has to be entered at [facility] point of entry) Patient coming from: Home  I have personally briefly reviewed patient's old medical records in [facility] Link  Chief Complaint: I fell and broke my hip  HPI: Mario Patrick is a 84 y.o. male with medical history significant of CAD status post CABG, HTN, chronic HFpEF, CKD stage IIIb, BPH, carotid stenosis, chronic thrombocytopenia, COPD, anxiety/depression, presented with mechanical fall and right hip pain.  Patient has a chronic ambulatory impairment using roller walker to ambulate, this morning, he was using a vacuum to clean his floor and lost his stepping with a walker and fell on his right side hip, denies any lightheadedness chest pain shortness of breath before or during the episode, no head or neck injury, no LOC.  At baseline he is not active because of the multiple OA's and uses roller walker to ambulate.  No stress test in recent years. ED Course: Afebrile, nontachycardic blood pressure 180/80 O2 saturation 97% on room air.  Hip x-ray showed intertrochanteric fracture of right hip, blood work showed creatinine 1.7 compared to baseline 2.0-2.7 BUN 26 hemoglobin 10.7 WBC 4.5 platelet 60.  Review of Systems: As per HPI otherwise 14 point review of systems negative.    Past Medical History:  Diagnosis Date   Anginal pain    Arthritis    Back ache    CHF (congestive heart failure) (HCC)    Chronic kidney disease    stage 3   COPD (chronic obstructive pulmonary disease) (HCC)    Coronary artery disease    recently checked carotid arteries, significant blockage   Depression    Dyspnea    Hypercholesteremia    Hypertension    Myocardial infarction [facility])     Past Surgical History:  Procedure Laterality Date   AORTIC VALVE REPLACEMENT     BACK  SURGERY     CARDIAC SURGERY     CABG X4   COLONOSCOPY     IR KYPHO LUMBAR INC FX REDUCE BONE BX UNI/BIL CANNULATION INC/IMAGING  09/13/2024   IR KYPHO THORACIC WITH BONE BIOPSY  09/13/2024   IR RADIOLOGIST EVAL & MGMT  09/12/2024   IR RADIOLOGIST EVAL & MGMT  10/04/2024   KYPHOPLASTY N/A 07/16/2021   Procedure: L1 KYPHOPLASTY;  Surgeon: Kathlynn Sharper, MD;  Location: [facility] ORS;  Service: Orthopedics;  Laterality: N/A;     reports that he has quit smoking. He has never used smokeless tobacco. He reports that he does not drink alcohol and does not use drugs.  Allergies[1]  Family History  Problem Relation Age of Onset   Lung disease Mother    Cancer Father     Prior to Admission medications  Medication Sig Start Date End Date Taking? Authorizing Provider  acetaminophen  (TYLENOL ) 500 MG tablet Take 500 mg by mouth every 6 (six) hours as needed for moderate pain.    [provider]  albuterol  (VENTOLIN  HFA) 108 (90 Base) MCG/ACT inhaler Inhale 1-2 puffs into the lungs every 6 (six) hours as needed for wheezing or shortness of breath. Patient taking differently: Inhale 2 puffs into the lungs every 6 (six) hours as needed for wheezing or shortness of breath. 03/06/21   Gwenn Kent, MD  atorvastatin  (LIPITOR ) 80 MG tablet Take 1 tablet by mouth at bedtime. 12/22/22 01/04/24  [provider]  ezetimibe  (ZETIA ) 10 MG tablet Take 1 tablet (10 mg total) by mouth daily. 10/01/20   Pahwani, Velna SAUNDERS, MD  finasteride  (PROSCAR ) 5 MG tablet Take 1 tablet (5 mg total) by mouth daily. 11/02/22   Penne Knee, MD  fluticasone  (FLONASE ) 50 MCG/ACT nasal spray SHAKE LIQUID AND USE 2 SPRAYS IN EACH NOSTRIL EVERY DAY 04/20/22   [provider]  Fluticasone -Salmeterol (ADVAIR) 250-50 MCG/DOSE AEPB Inhale 1 puff into the lungs 2 (two) times daily. Patient taking differently: Inhale 1 puff into the lungs 2 (two) times daily as needed (shortness of breath). 10/01/20   Pahwani, Velna SAUNDERS,  MD  ipratropium-albuterol  (DUONEB) 0.5-2.5 (3) MG/3ML SOLN Take 3 mLs by nebulization every 6 (six) hours as needed. Patient not taking: Reported on 01/04/2024 10/01/20   Vernon Velna SAUNDERS, MD  JARDIANCE 10 MG TABS tablet Take by mouth. 03/12/21   [provider]  lisinopril  (ZESTRIL ) 10 MG tablet Take 10 mg by mouth daily. 01/15/23   [provider]  meclizine  (ANTIVERT ) 25 MG tablet Take 1 tablet (25 mg total) by mouth 3 (three) times daily as needed for dizziness. Patient not taking: Reported on 01/04/2024 10/01/20   Vernon Velna SAUNDERS, MD  methocarbamol  (ROBAXIN ) 500 MG tablet Take by mouth daily. As needed    [provider]  metoprolol  succinate (TOPROL -XL) 25 MG 24 hr tablet Take 12.5 mg by mouth daily. 08/28/21 01/04/24  [provider]  nitroGLYCERIN  (NITROSTAT ) 0.4 MG SL tablet Place 0.4 mg under the tongue every 5 (five) minutes as needed for chest pain.    [provider]  OVER THE COUNTER MEDICATION Apply 1 application topically daily as needed (pain). Cbd cream    [provider]  sertraline  (ZOLOFT ) 100 MG tablet Take 1 tablet (100 mg total) by mouth daily. Patient taking differently: Take 150 mg by mouth daily. 10/01/20   Pahwani, Velna SAUNDERS, MD  SPIRIVA  HANDIHALER 18 MCG inhalation capsule INHALE THE CONTENTS OF 1 CAPSULE VIA INHALATION DEVICE EVERY DAY Patient taking differently: Place 18 mcg into inhaler and inhale daily as needed (shortness of breath). 12/03/20   Antonetta Rollene BRAVO, MD  tamsulosin  (FLOMAX ) 0.4 MG CAPS capsule Take 1 capsule by mouth daily.    [provider]  traZODone  (DESYREL ) 100 MG tablet Take 100 mg by mouth at bedtime. 11/01/22   [provider]    Physical Exam: Vitals:   12/12/24 1201 12/12/24 1230 12/12/24 1300 12/12/24 1317  BP:  (!) 193/80 (!) 185/83   Pulse:  77 73   Resp:  18 18   Temp:      TempSrc:      SpO2: 97% 97% 95%   Weight:    56.7 kg  Height:    5' 7 (1.702 m)     Constitutional: NAD, calm, comfortable Vitals:   12/12/24 1201 12/12/24 1230 12/12/24 1300 12/12/24 1317  BP:  (!) 193/80 (!) 185/83   Pulse:  77 73   Resp:  18 18   Temp:      TempSrc:      SpO2: 97% 97% 95%   Weight:    56.7 kg  Height:    5' 7 (1.702 m)   Eyes: PERRL, lids and conjunctivae normal ENMT: Mucous membranes are moist. Posterior pharynx clear of any exudate or lesions.Normal dentition.  Neck: normal, supple, no masses, no thyromegaly Respiratory: clear to auscultation bilaterally, no wheezing, no crackles. Normal respiratory effort. No accessory muscle use.  Cardiovascular: Regular rate  and rhythm, no murmurs / rubs / gallops. No extremity edema. 2+ pedal pulses. No carotid bruits.  Abdomen: no tenderness, no masses palpated. No hepatosplenomegaly. Bowel sounds positive.  Musculoskeletal: Right leg shortened and rotated Skin: no rashes, lesions, ulcers. No induration Neurologic: CN 2-12 grossly intact. Sensation intact, DTR normal. Strength 5/5 in all 4.  Psychiatric: Normal judgment and insight. Alert and oriented x 3. Normal mood.    Labs on Admission: I have personally reviewed following labs and imaging studies  CBC: Recent Labs  Lab 12/12/24 1111  WBC 4.5  NEUTROABS 3.2  HGB 10.7*  HCT 33.9*  MCV 93.9  PLT 60*   Basic Metabolic Panel: Recent Labs  Lab 12/12/24 1111  NA 141  K 4.1  CL 107  CO2 25  GLUCOSE 119*  BUN 26*  CREATININE 1.79*  CALCIUM  8.8*   GFR: Estimated Creatinine Clearance: 25.5 mL/min (A) (by C-G formula based on SCr of 1.79 mg/dL (H)). Liver Function Tests: No results for input(s): AST, ALT, ALKPHOS, BILITOT, PROT, ALBUMIN in the last 168 hours. No results for input(s): LIPASE, AMYLASE in the last 168 hours. No results for input(s): AMMONIA in the last 168 hours. Coagulation Profile: Recent Labs  Lab 12/12/24 1111  INR 1.2   Cardiac Enzymes: No results for input(s): CKTOTAL, CKMB,  CKMBINDEX, TROPONINI in the last 168 hours. BNP (last 3 results) No results for input(s): PROBNP in the last 8760 hours. HbA1C: No results for input(s): HGBA1C in the last 72 hours. CBG: No results for input(s): GLUCAP in the last 168 hours. Lipid Profile: No results for input(s): CHOL, HDL, LDLCALC, TRIG, CHOLHDL, LDLDIRECT in the last 72 hours. Thyroid  Function Tests: No results for input(s): TSH, T4TOTAL, FREET4, T3FREE, THYROIDAB in the last 72 hours. Anemia Panel: No results for input(s): VITAMINB12, FOLATE, FERRITIN, TIBC, IRON, RETICCTPCT in the last 72 hours. Urine analysis:    Component Value Date/Time   COLORURINE STRAW (A) 01/04/2024 2154   APPEARANCEUR CLEAR (A) 01/04/2024 2154   APPEARANCEUR Clear 02/01/2023 1315   LABSPEC 1.005 01/04/2024 2154   PHURINE 6.0 01/04/2024 2154   GLUCOSEU 150 (A) 01/04/2024 2154   HGBUR SMALL (A) 01/04/2024 2154   BILIRUBINUR NEGATIVE 01/04/2024 2154   BILIRUBINUR Negative 02/01/2023 1315   KETONESUR NEGATIVE 01/04/2024 2154   PROTEINUR 30 (A) 01/04/2024 2154   NITRITE NEGATIVE 01/04/2024 2154   LEUKOCYTESUR TRACE (A) 01/04/2024 2154    Radiological Exams on Admission: DG Chest Portable 1 View Result Date: 12/12/2024 CLINICAL DATA:  Medical clearance EXAM: PORTABLE CHEST 1 VIEW COMPARISON:  January 04, 2024. FINDINGS: Stable cardiomegaly. Status post surgical fixation of sternum as noted on prior study. Right lung is clear. Minimal left basilar subsegmental atelectasis is noted with minimal left pleural effusion. Postsurgical changes are noted in right shoulder. IMPRESSION: Minimal left basilar subsegmental atelectasis with minimal left pleural effusion. Electronically Signed   By: Lynwood Landy Raddle M.D.   On: 12/12/2024 13:42   DG Hip Unilat W or Wo Pelvis 2-3 Views Left Result Date: 12/12/2024 CLINICAL DATA:  Status post fall. EXAM: DG HIP (WITH OR WITHOUT PELVIS) 2-3V LEFT COMPARISON:  February 14, 2021 FINDINGS: There is an acute fracture deformity extending through the inter trochanteric region of the proximal left femur. There is no evidence of dislocation. A radiopaque intramedullary rod and compression screw device are present within the proximal right femur. Soft tissue swelling is seen along the lateral aspect of the left hip. IMPRESSION: Acute intertrochanteric fracture of the proximal  left femur. Electronically Signed   By: Suzen Dials M.D.   On: 12/12/2024 12:39    EKG: Pending  Assessment/Plan Principal Problem:   Hip fracture [facility]) Active Problems:   Hip fracture, unspecified laterality, closed, initial encounter (HCC)  (please populate well all problems here in Problem List. (For example, if patient is on BP meds at home and you resume or decide to hold them, it is a problem that needs to be her. Same for CAD, COPD, HLD and so on)  Right intertrochanteric femoral fracture - Secondary to mechanical fall - OR this afternoon - Calculated revised cardiac risk for primary operation low risk is at 10% significantly increased, cardiology consulted for preop clearance.  History of multivessel CAD status post CABG - Review of patient cardiac records showed patient had LHC back in 2022 which showed multiple stenosis and there was a recommendation for patient to have another PCI to address the stenosed left main, which was never done.  No recent stress test. - Expect medical management given his age and comorbidities.  Will increase his metoprolol  dosage from 12.5 mg daily to 12.5 mg twice daily. - Continue lisinopril  starting at 30  HTN Chronic HFpEF -Euvolemic - Continue lisinopril   COPD - Stable  CKD stage IIIb - Euvolemic, creatinine level stable   DVT prophylaxis: Heparin  subcu Code Status: DNR as per daughter Family Communication: Daughter at bedside Disposition Plan: Patient is sick with hip fracture requiring ORIF and postop PT evaluation, expect more  than 2 midnight hospital stay. Consults called: Orthopedic surgery, cardiology Admission status: MedSurg admission   Cort ONEIDA Mana MD [facility] Pager [phone] 12/12/2024, 1:45 PM        [1]  Allergies Allergen Reactions   Contrast Media  [Iodinated Contrast Media] Nausea Only    GI upset    Aspirin  Other (See Comments)    Thrombocytopenia (PER PT CAN TAKE LOW DOSE ASPIRIN )   "

## 2024-12-12 NOTE — Anesthesia Preprocedure Evaluation (Signed)
 "                                  Anesthesia Evaluation  Patient identified by MRN, date of birth, ID band Patient awake    Reviewed: Allergy & Precautions, NPO status , Patient's Chart, lab work & pertinent test results  History of Anesthesia Complications Negative for: history of anesthetic complications  Airway Mallampati: II  TM Distance: >3 FB Neck ROM: Full    Dental  (+) Lower Dentures, Edentulous Upper, Edentulous Lower, Partial Upper, Dental Advidsory Given   Pulmonary shortness of breath and with exertion, neg sleep apnea, COPD,  COPD inhaler, neg recent URI, former smoker   Pulmonary exam normal        Cardiovascular hypertension, + angina  + CAD, + Past MI, + CABG and +CHF  Normal cardiovascular exam(-) dysrhythmias + Valvular Problems/Murmurs   Echo 01/05/24: 1. Left ventricular ejection fraction, by estimation, is 60 to 65%. The left ventricle has normal function. The left ventricle has no regional wall motion abnormalities. There is moderate left ventricular hypertrophy. Left ventricular diastolic parameters are consistent with Grade I diastolic dysfunction (impaired relaxation).   2. Right ventricular systolic function is normal. The right ventricular size is normal. Tricuspid regurgitation signal is inadequate for assessing PA pressure.   3. Left atrial size was mild to moderately dilated.   4. The mitral valve is normal in structure. Mild mitral valve regurgitation. No evidence of mitral stenosis.   5. The aortic valve is normal in structure. There is mild calcification of the aortic valve. Aortic valve regurgitation is not visualized. Aortic valve sclerosis/calcification is present, without any evidence of aortic stenosis.   6. The inferior vena cava is normal in size with greater than 50% respiratory variability, suggesting right atrial pressure of 3 mmHg.   Neuro/Psych  PSYCHIATRIC DISORDERS  Depression    negative neurological ROS      GI/Hepatic Neg liver ROS,GERD  ,,  Endo/Other  diabetes, Well Controlled    Renal/GU Renal disease  negative genitourinary   Musculoskeletal  (+) Arthritis , Osteoarthritis,    Abdominal   Peds negative pediatric ROS (+)  Hematology negative hematology ROS (+)   Anesthesia Other Findings Anginal pain (HCC)    Arthritis    Back ache    CHF (congestive heart failure) (HCC) Chronic kidney disease   COPD    Coronary artery disease   Depression    Dyspnea    Hypercholesteremia    Hypertension    Myocardial infarction (HCC) Left ventricular ejection fraction, by estimation, is 55 to 60%   Reproductive/Obstetrics negative OB ROS                              Anesthesia Physical Anesthesia Plan  ASA: 3  Anesthesia Plan: General   Post-op Pain Management:    Induction: Intravenous  PONV Risk Score and Plan: 2 and Midazolam  and Ondansetron   Airway Management Planned: Oral ETT and LMA  Additional Equipment:   Intra-op Plan:   Post-operative Plan: Extubation in OR  Informed Consent: I have reviewed the patients History and Physical, chart, labs and discussed the procedure including the risks, benefits and alternatives for the proposed anesthesia with the patient or authorized representative who has indicated his/her understanding and acceptance.       Plan Discussed with: CRNA, Anesthesiologist and Surgeon  Anesthesia  Plan Comments:          Anesthesia Quick Evaluation  "

## 2024-12-12 NOTE — ED Provider Notes (Signed)
 "  [facility] Provider Note    Event Date/Time   First MD Initiated Contact with Patient 12/12/24 1059     (approximate)   History   Chief Complaint Fall   HPI  Jayon Matton Behnke is a 84 y.o. male with past medical history of hypertension, diabetes, CAD, HFpEF, COPD, and CKD who presents to the ED complaining of fall.  Patient reports that just prior to arrival he lost his balance and fell to the ground, striking his left hip.  He denies hitting his head or losing consciousness, denies taking a blood thinner.  He reports significant pain in his left hip and has been unable to walk since the fall.  He was given on 100 mcg of fentanyl  by EMS without significant improvement.     Physical Exam   Triage Vital Signs: ED Triage Vitals [12/12/24 1103]  Encounter Vitals Group     BP (!) 209/80     Girls Systolic BP Percentile      Girls Diastolic BP Percentile      Boys Systolic BP Percentile      Boys Diastolic BP Percentile      Pulse Rate 71     Resp 20     Temp 97.8 F (36.6 C)     Temp Source Oral     SpO2      Weight      Height      Head Circumference      Peak Flow      Pain Score      Pain Loc      Pain Education      Exclude from Growth Chart     Most recent vital signs: Vitals:   12/12/24 1201 12/12/24 1230  BP:  (!) 193/80  Pulse:  77  Resp:  18  Temp:    SpO2: 97% 97%    Constitutional: Alert and oriented. Eyes: Conjunctivae are normal. Head: Atraumatic. Nose: No congestion/rhinnorhea. Mouth/Throat: Mucous membranes are moist.  Neck: No midline cervical spine tenderness to palpation. Cardiovascular: Normal rate, regular rhythm. Grossly normal heart sounds.  2+ radial and DP pulses bilaterally. Respiratory: Normal respiratory effort.  No retractions. Lungs CTAB.  No chest wall tenderness to palpation. Gastrointestinal: Soft and nontender. No distention. Musculoskeletal: Diffuse tenderness to palpation of left hip with no  obvious deformity, no tenderness to palpation at right hip, bilateral knees, or bilateral ankles.  No tenderness noted to the upper extremities. Neurologic:  Normal speech and language. No gross focal neurologic deficits are appreciated.    ED Results / Procedures / Treatments   Labs (all labs ordered are listed, but only abnormal results are displayed) Labs Reviewed  CBC WITH DIFFERENTIAL/PLATELET - Abnormal; Notable for the following components:      Result Value   RBC 3.61 (*)    Hemoglobin 10.7 (*)    HCT 33.9 (*)    Platelets 60 (*)    All other components within normal limits  BASIC METABOLIC PANEL WITH GFR - Abnormal; Notable for the following components:   Glucose, Bld 119 (*)    BUN 26 (*)    Creatinine, Ser 1.79 (*)    Calcium  8.8 (*)    GFR, Estimated 37 (*)    All other components within normal limits  PROTIME-INR  SAMPLE TO BLOOD BANK  TYPE AND SCREEN     EKG  ED ECG REPORT I, Carlin Palin, the attending physician, personally viewed and interpreted this ECG.  Date: 12/12/2024  EKG Time: 11:31  Rate: 72  Rhythm: normal sinus rhythm  Axis: Normal  Intervals:left anterior fascicular block  ST&T Change: Lateral T wave inversions, similar to previous, LVH  RADIOLOGY Left hip x-ray reviewed and interpreted by me with intertrochanteric fracture, no dislocation noted.  PROCEDURES:  Critical Care performed: No  Procedures   MEDICATIONS ORDERED IN ED: Medications  tranexamic acid  (CYKLOKAPRON ) IVPB 1,000 mg (has no administration in time range)  morphine  (PF) 2 MG/ML injection 2 mg (has no administration in time range)  morphine  (PF) 2 MG/ML injection 2 mg (2 mg Intravenous Given 12/12/24 1138)     IMPRESSION / MDM / ASSESSMENT AND PLAN / ED COURSE  I reviewed the triage vital signs and the nursing notes.                              84 y.o. male with past medical history of hypertension, diabetes, CAD, HFpEF, COPD, and CKD who presents to the ED  complaining of left hip pain following a fall.  Patient's presentation is most consistent with acute presentation with potential threat to life or bodily function.  Differential diagnosis includes, but is not limited to, fracture, dislocation, contusion, arrhythmia, anemia, electrolyte abnormality, AKI.  Patient uncomfortable but nontoxic-appearing and in no acute distress, vital signs remarkable for hypertension but otherwise reassuring.  Patient with diffuse tenderness to palpation of his left hip without obvious deformity, is neurovascularly intact distally.  We will further assess with x-ray, no evidence of traumatic injury to his head, neck, or upper extremities.  We will treat pain with IV morphine  and reassess following imaging.  X-ray shows intertrochanteric fracture of the left hip, will give additional IV morphine  for pain control.  Labs with stable CKD and thrombocytopenia, no significant anemia, leukocytosis, or electrolyte abnormality noted.  Case discussed with Dr. Tobie of orthopedics, who requests 1 g IV TXA to minimize bleeding with surgery.  Case discussed with hospitalist for admission.      FINAL CLINICAL IMPRESSION(S) / ED DIAGNOSES   Final diagnoses:  Closed fracture of left hip, initial encounter (HCC)  Thrombocytopenia  Chronic kidney disease, unspecified CKD stage     Rx / DC Orders   ED Discharge Orders     None        Note:  This document was prepared using Dragon voice recognition software and may include unintentional dictation errors.   Willo Dunnings, MD 12/12/24 1257  "

## 2024-12-12 NOTE — Op Note (Signed)
 DATE OF SURGERY: 12/12/2024  PREOPERATIVE DIAGNOSIS: Left intertrochanteric hip fracture  POSTOPERATIVE DIAGNOSIS: Left intertrochanteric hip fracture  PROCEDURE: Intramedullary nailing of Left femur with cephalomedullary device  SURGEON: Earnestine HILARIO Blanch, MD  ANESTHESIA: Gen  EBL: 100 cc  IVF: per anesthesia record  COMPONENTS:  [employer] Trigen Intertan Short Nail: 10x164mm; 90mm lag screw with 85mm compression screw; 5x 32.43mm distal cortical interlocking screw  INDICATIONS: Mario Patrick is a 84 y.o. male who sustained an intertrochanteric fracture after a fall. Risks and benefits of intramedullary nailing were explained to the patient and/or family . Risks include but are not limited to bleeding, infection, injury to tissues, nerves, vessels, nonunion/malunion, hardware failure, limb length discrepancy/hip rotation mismatch and risks of anesthesia. The patient and/or family understand these risks, have completed an informed consent, and wish to proceed.   PROCEDURE:  The patient was brought into the operating room. After administering anesthesia, the patient was placed in the supine position on the Hana table. The uninjured leg was placed in an extended position while the injured lower extremity was placed in longitudinal traction. The fracture was reduced using longitudinal traction and internal rotation. The adequacy of reduction was verified fluoroscopically in AP and lateral projections and found to be acceptable. The lateral aspect of the hip and thigh were prepped with ChloraPrep solution before being draped sterilely. Preoperative IV antibiotics were administered. A timeout was performed to verify the appropriate surgical site, patient, and procedure.    The greater trochanter was identified and an approximately 5 cm incision was made about 2 fingerbreadths above the tip of the greater trochanter. The incision was carried down through the subcutaneous tissues to expose  the gluteal fascia. This was split the length of the incision, providing access to the tip of the trochanter. Under fluoroscopic guidance, a guidewire was drilled through the tip of the trochanter into the proximal metaphysis to the level of the lesser trochanter. After verifying its position fluoroscopically in AP and lateral projections, it was overreamed with the opening reamer to the level of the lesser trochanter. The nail was selected and advanced to the appropriate depth as verified fluoroscopically.    The guide system for the lag screw was positioned and advanced through an approximately 5cm incision over the lateral aspect of the proximal femur. The guidewire was drilled up through the femoral nail and into the femoral neck to rest within 5 mm of subchondral bone. After verifying its position in the femoral neck and head in both AP and lateral projections, the guidewire was measured and appropriate sized lag screw was selected.  The channel for the compression screw was drilled and antirotation bar was placed.  Lag screw was drilled and placed in appropriate position.  Compression screw was then placed.  Appropriate compression was achieved.  The set screw was locked in place. Again, the adequacy of hardware position and fracture reduction was verified fluoroscopically in AP and lateral projections.   Attention was then turned to the distal interlocking screw in the diaphysis. Using a targeted assembly, a stab incision was made and hole was drilled through the nail. An interlocking screw was placed with excellent purchase.  Appropriate screw position was verified fluoroscopically in AP and lateral projections.   The wounds were irrigated thoroughly with sterile saline solution. Local anesthetic was injected into the wounds. Deep fascia was closed with 0-Vicryl. The subcutaneous tissues were closed using 2-0 Vicryl interrupted sutures. The skin was closed using staples. Sterile occlusive dressings  were applied to all wounds. The patient was then transferred to the recovery room in satisfactory condition.   POSTOPERATIVE PLAN: The patient will be WBAT on the operative extremity. Lovenox  40mg /day x 4 weeks to start on POD#1. Perioperative IV antibiotics x 24 hours. PT/OT on POD#1.

## 2024-12-12 NOTE — Consult Note (Signed)
 " [facility] CARDIOLOGY CONSULT NOTE       Patient ID: Mario Patrick MRN: [id number] DOB/AGE: [birth year] 83 y.o.  Admit date: 12/12/2024 Referring Physician Dr. Cort Mana Primary Physician Zachary Idelia LABOR, MD  Primary Cardiologist Dr. Cara Lovelace Reason for Consultation POC  HPI: Mario Patrick is a 84 y.o. 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  Found to have hip fracture, orthopedics is recommending surgery.  Cardiology was consulted for preoperative evaluation.   Patient suffered a fall earlier today after losing his balance and fell on his left hip.  Did not have any LOC. Workup in the ED notable for creatinine 1.79, potassium 4.1, hemoglobin 10.7, WBC 4.5. EKG in the ED with NSR, ST-T changes in lateral leads which appear similar to prior.  Hip x-ray with acute intertrochanteric fracture of left proximal femur.  Chest x-ray with no acute abnormality on my preliminary review.  At the time my evaluation this afternoon, he is resting in bed with daughter at bedside. We discussed his presentation in further detail. He reports he tripped while vacuuming his house this AM and fell on his L hip. No LOC, lightheadedness or dizziness. States he otherwise had been doing well recently with no issues. Denies any chest pain, SOB, palpitations. No exertional symptoms but functional status is limited at baseline. Daughter reports she does notice at times he gets SOB with activity but this has been stable for many years.   Review of systems complete and found to be negative unless listed above    Past Medical History:  Diagnosis Date   Anginal pain    Arthritis    Back ache    CHF (congestive heart  failure) (HCC)    Chronic kidney disease    stage 3   COPD (chronic obstructive pulmonary disease) (HCC)    Coronary artery disease    recently checked carotid arteries, significant blockage   Depression    Dyspnea    Hypercholesteremia    Hypertension    Myocardial infarction [facility])     Past Surgical History:  Procedure Laterality Date   AORTIC VALVE REPLACEMENT     BACK SURGERY     CARDIAC SURGERY     CABG X4   COLONOSCOPY     IR KYPHO LUMBAR INC FX REDUCE BONE BX UNI/BIL CANNULATION INC/IMAGING  09/13/2024   IR KYPHO THORACIC WITH BONE BIOPSY  09/13/2024   IR RADIOLOGIST EVAL & MGMT  09/12/2024   IR RADIOLOGIST EVAL & MGMT  10/04/2024   KYPHOPLASTY N/A 07/16/2021   Procedure: L1 KYPHOPLASTY;  Surgeon: Kathlynn Sharper, MD;  Location: [facility] ORS;  Service: Orthopedics;  Laterality: N/A;    (Not in a hospital admission)  Social History   Socioeconomic History   Marital status: Widowed    Spouse name: Not on file   Number of children: Not on file   Years of education: Not on file   Highest education level: Not on file  Occupational History   Not on file  Tobacco Use   Smoking status: Former   Smokeless tobacco: Never  Vaping Use   Vaping status: Never Used  Substance and Sexual Activity   Alcohol use: No   Drug use: Never  Sexual activity: Not on file  Other Topics Concern   Not on file  Social History Narrative   Lives with house mate.   Social Drivers of Health   Tobacco Use: Medium Risk (11/29/2024)   [facility]   Patient History    Smoking Tobacco Use: Former    Smokeless Tobacco Use: Never    Passive Exposure: Past  Physicist, Medical Strain: Low Risk  (11/07/2024)   Received from [facility]   Overall Financial Resource Strain (CARDIA)    Difficulty of Paying Living Expenses: Not hard at all  Food Insecurity: No Food Insecurity (11/07/2024)   Received from [facility]   Epic    Within the past 12  months, you worried that your food would run out before you got the money to buy more.: Never true    Within the past 12 months, the food you bought just didn't last and you didn't have money to get more.: Never true  Transportation Needs: No Transportation Needs (11/07/2024)   Received from [facility] - Transportation    In the past 12 months, has lack of transportation kept you from medical appointments or from getting medications?: No    Lack of Transportation (Non-Medical): No  Physical Activity: Not on file  Stress: Not on file  Social Connections: Not on file  Intimate Partner Violence: Not on file  Depression (EYV7-0): Not on file  Alcohol Screen: Not on file  Housing: Low Risk  (11/07/2024)   Received from [facility]   Epic    In the last 12 months, was there a time when you were not able to pay the mortgage or rent on time?: No    In the past 12 months, how many times have you moved where you were living?: 0    At any time in the past 12 months, were you homeless or living in a shelter (including now)?: No  Utilities: Not At Risk (11/07/2024)   Received from [facility]   Epic    In the past 12 months has the electric, gas, oil, or water company threatened to shut off services in your home?: No  Health Literacy: Not on file    Family History  Problem Relation Age of Onset   Lung disease Mother    Cancer Father      Vitals:   12/12/24 1103 12/12/24 1130 12/12/24 1201 12/12/24 1230  BP: (!) 209/80 (!) 162/75  (!) 193/80  Pulse: 71 75  77  Resp: 20   18  Temp: 97.8 F (36.6 C)     TempSrc: Oral     SpO2: 98%  97% 97%    PHYSICAL EXAM General: Chronically ill appearing elderly male, well nourished, in no acute distress. HEENT: Normocephalic and atraumatic. Neck: No JVD.  Lungs: Normal respiratory effort on room air. Clear bilaterally to auscultation. No wheezes, crackles, rhonchi.  Heart: HRRR.  Normal S1 and S2 without gallops or murmurs.  Abdomen: Non-distended appearing.  Msk: Normal strength and tone for age. Extremities: Warm and well perfused. No clubbing, cyanosis. No edema.  Neuro: Alert and oriented X 3. Psych: Answers questions appropriately.   Labs: Basic Metabolic Panel: Recent Labs    12/12/24 1111  NA 141  K 4.1  CL 107  CO2 25  GLUCOSE 119*  BUN 26*  CREATININE 1.79*  CALCIUM  8.8*   Liver Function Tests: No results for  input(s): AST, ALT, ALKPHOS, BILITOT, PROT, ALBUMIN in the last 72 hours. No results for input(s): LIPASE, AMYLASE in the last 72 hours. CBC: Recent Labs    12/12/24 1111  WBC 4.5  NEUTROABS 3.2  HGB 10.7*  HCT 33.9*  MCV 93.9  PLT 60*   Cardiac Enzymes: No results for input(s): CKTOTAL, CKMB, CKMBINDEX, TROPONINIHS in the last 72 hours. BNP: No results for input(s): BNP in the last 72 hours. D-Dimer: No results for input(s): DDIMER in the last 72 hours. Hemoglobin A1C: No results for input(s): HGBA1C in the last 72 hours. Fasting Lipid Panel: No results for input(s): CHOL, HDL, LDLCALC, TRIG, CHOLHDL, LDLDIRECT in the last 72 hours. Thyroid  Function Tests: No results for input(s): TSH, T4TOTAL, T3FREE, THYROIDAB in the last 72 hours.  Invalid input(s): FREET3 Anemia Panel: No results for input(s): VITAMINB12, FOLATE, FERRITIN, TIBC, IRON, RETICCTPCT in the last 72 hours.   Radiology: DG Hip Unilat W or Wo Pelvis 2-3 Views Left Result Date: 12/12/2024 CLINICAL DATA:  Status post fall. EXAM: DG HIP (WITH OR WITHOUT PELVIS) 2-3V LEFT COMPARISON:  February 14, 2021 FINDINGS: There is an acute fracture deformity extending through the inter trochanteric region of the proximal left femur. There is no evidence of dislocation. A radiopaque intramedullary rod and compression screw device are present within the proximal right femur. Soft tissue swelling is seen along the  lateral aspect of the left hip. IMPRESSION: Acute intertrochanteric fracture of the proximal left femur. Electronically Signed   By: Suzen Dials M.D.   On: 12/12/2024 12:39    ECHO 12/2023: 1. Left ventricular ejection fraction, by estimation, is 60 to 65%. The left ventricle has normal function. The left ventricle has no regional wall motion abnormalities. There is moderate left ventricular hypertrophy. Left ventricular diastolic parameters are consistent with Grade I diastolic dysfunction (impaired relaxation).   2. Right ventricular systolic function is normal. The right ventricular size is normal. Tricuspid regurgitation signal is inadequate for assessing PA pressure.   3. Left atrial size was mild to moderately dilated.   4. The mitral valve is normal in structure. Mild mitral valve  regurgitation. No evidence of mitral stenosis.   5. The aortic valve is normal in structure. There is mild calcification of the aortic valve. Aortic valve regurgitation is not visualized. Aortic valve sclerosis/calcification is present, without any evidence of aortic stenosis.   6. The inferior vena cava is normal in size with greater than 50%  respiratory variability, suggesting right atrial pressure of 3 mmHg.   TELEMETRY (personally reviewed): not on tele  EKG (personally reviewed): NSR, ST-T changes in lateral leads which appear similar to prior  Data reviewed by me 12/12/2024: last 24h vitals tele labs imaging I/O ED provider note, admission H&P  Active Problems:   * No active hospital problems. *    ASSESSMENT AND PLAN:  Mario Patrick is a 84 y.o. male  with a past medical history of coronary artery disease s/p CABG x 3 2005 (SVG to LAD, OM, and RPDA which were patent on 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  Found to have hip fracture,  orthopedics is recommending surgery.  Cardiology was consulted for preoperative evaluation.   # Preoperative cardiac evaluation # Fall with L hip fx # Coronary artery disease s/p CABG # Chronic HFpEF # Hypertension # Hyperlipidemia # COPD # CKD Patient presented  after fall this AM found to have fracture of L proximal femur. Ortho suggests surgery for repair. No complaints of CP, SOB, exertional symptoms recently. Last echo 2/25 with preserved EF and no WMAs. LHC 2022 with patent grafts. EKG today demonstrates NSR with ST-T wave changes similar to prior. - Continue home atorvastatin  80 mg daily and zetia  10 mg daily.  - Continue home lisinopril  10 mg daily and metoprolol  tartrate 12.5 mg twice daily.  - Historically has not been on aspirin  due to documented allergy.  - Patient is at elevated but acceptable risk for proceeding with surgery given cardiac history understanding risk/benefits.  Can proceed with L femur surgery with Dr. Tobie without further cardiac diagnostics needed.    This patient's plan of care was discussed and created with Dr. Florencio and he is in agreement.  Signed: Danita Bloch, PA-C  12/12/2024, 1:16 PM [facility] Cardiology      "

## 2024-12-12 NOTE — Consult Note (Signed)
 "                                                                ORTHOPAEDIC CONSULTATION  REQUESTING PHYSICIAN: Laurita Cort DASEN, MD  Chief Complaint:   L hip pain  History of Present Illness: Mario Patrick is a 83 y.o. male who had a fall earlier today after he tripped while vacuuming. The patient noted immediate hip pain and inability to ambulate. The patient ambulates with a walker at baseline but did not have it when he fell today. The patient lives at home alone. Pain is worse with any sort of movement. X-rays in the emergency department show a left intertrochanteric hip fracture.  He has a medical hx significant for CAD with prior CABG, CHF, COPD, and CKD.   Past Medical History:  Diagnosis Date   Anginal pain    Arthritis    Back ache    CHF (congestive heart failure) (HCC)    Chronic kidney disease    stage 3   COPD (chronic obstructive pulmonary disease) (HCC)    Coronary artery disease    recently checked carotid arteries, significant blockage   Depression    Dyspnea    Hypercholesteremia    Hypertension    Myocardial infarction [facility])    Past Surgical History:  Procedure Laterality Date   AORTIC VALVE REPLACEMENT     BACK SURGERY     CARDIAC SURGERY     CABG X4   COLONOSCOPY     IR KYPHO LUMBAR INC FX REDUCE BONE BX UNI/BIL CANNULATION INC/IMAGING  09/13/2024   IR KYPHO THORACIC WITH BONE BIOPSY  09/13/2024   IR RADIOLOGIST EVAL & MGMT  09/12/2024   IR RADIOLOGIST EVAL & MGMT  10/04/2024   KYPHOPLASTY N/A 07/16/2021   Procedure: L1 KYPHOPLASTY;  Surgeon: Kathlynn Sharper, MD;  Location: [facility] ORS;  Service: Orthopedics;  Laterality: N/A;   Social History   Socioeconomic History   Marital status: Widowed    Spouse name: Not on file   Number of children: Not on file   Years of education: Not on file   Highest education level: Not on file  Occupational History   Not on file  Tobacco Use   Smoking status: Former   Smokeless tobacco: Never  Vaping Use    Vaping status: Never Used  Substance and Sexual Activity   Alcohol use: No   Drug use: Never   Sexual activity: Not on file  Other Topics Concern   Not on file  Social History Narrative   Lives with house mate.   Social Drivers of Health   Tobacco Use: Medium Risk (12/12/2024)   Patient History    Smoking Tobacco Use: Former    Smokeless Tobacco Use: Never    Passive Exposure: Not on file  Financial Resource Strain: Low Risk  (11/07/2024)   Received from [facility]   Overall Financial Resource Strain (CARDIA)    Difficulty of Paying Living Expenses: Not hard at all  Food Insecurity: No Food Insecurity (12/12/2024)   Epic    Worried About Running Out of Food in the Last Year: Never true    Ran Out of Food in the Last Year: Never true  Transportation Needs: No Transportation Needs (12/12/2024)  Epic    Lack of Transportation (Medical): No    Lack of Transportation (Non-Medical): No  Physical Activity: Not on file  Stress: Not on file  Social Connections: Unknown (12/12/2024)   Social Connection and Isolation Panel    Frequency of Communication with Friends and Family: Three times a week    Frequency of Social Gatherings with Friends and Family: Three times a week    Attends Religious Services: More than 4 times per year    Active Member of Clubs or Organizations: Yes    Attends Banker Meetings: More than 4 times per year    Marital Status: Not on file  Depression (EYV7-0): Not on file  Alcohol Screen: Not on file  Housing: Low Risk (12/12/2024)   Epic    Unable to Pay for Housing in the Last Year: No    Number of Times Moved in the Last Year: 0    Homeless in the Last Year: No  Utilities: Not At Risk (12/12/2024)   Epic    Threatened with loss of utilities: No  Health Literacy: Not on file   Family History  Problem Relation Age of Onset   Lung disease Mother    Cancer Father    Allergies[1] Prior to Admission medications  Medication  Sig Start Date End Date Taking? Authorizing Provider  acetaminophen  (TYLENOL ) 500 MG tablet Take 500 mg by mouth every 6 (six) hours as needed for moderate pain.   Yes [provider]  albuterol  (VENTOLIN  HFA) 108 (90 Base) MCG/ACT inhaler Inhale 1-2 puffs into the lungs every 6 (six) hours as needed for wheezing or shortness of breath. Patient taking differently: Inhale 2 puffs into the lungs every 6 (six) hours as needed for wheezing or shortness of breath. 03/06/21  Yes Gwenn Kent, MD  amLODipine (NORVASC) 2.5 MG tablet Take 2.5 mg by mouth daily.   Yes [provider]  atorvastatin  (LIPITOR ) 80 MG tablet Take 1 tablet by mouth at bedtime. 12/22/22 12/12/24 Yes [provider]  desonide (DESOWEN) 0.05 % ointment Apply 1 Application topically 2 (two) times daily. 11/07/24 11/07/25 Yes [provider]  ezetimibe  (ZETIA ) 10 MG tablet Take 1 tablet (10 mg total) by mouth daily. 10/01/20  Yes Pahwani, Rinka R, MD  Fluticasone -Salmeterol (ADVAIR) 250-50 MCG/DOSE AEPB Inhale 1 puff into the lungs 2 (two) times daily. Patient taking differently: Inhale 1 puff into the lungs 2 (two) times daily as needed (shortness of breath). 10/01/20  Yes Pahwani, Rinka R, MD  lisinopril  (ZESTRIL ) 10 MG tablet Take 10 mg by mouth daily. 01/15/23  Yes [provider]  metoprolol  succinate (TOPROL -XL) 25 MG 24 hr tablet Take 12.5 mg by mouth daily. 08/28/21 12/12/24 Yes [provider]  mirtazapine  (REMERON ) 7.5 MG tablet Take 7.5 mg by mouth at bedtime.   Yes [provider]  nitroGLYCERIN  (NITROSTAT ) 0.4 MG SL tablet Place 0.4 mg under the tongue every 5 (five) minutes as needed for chest pain.   Yes [provider]  nystatin (MYCOSTATIN/NYSTOP) powder Apply 1 Application topically 2 (two) times daily. 11/07/24 11/07/25 Yes [provider]  sertraline  (ZOLOFT ) 100 MG tablet Take 1 tablet (100 mg total) by mouth daily. Patient taking  differently: Take 150 mg by mouth daily. 10/01/20  Yes Pahwani, Rinka R, MD  SPIRIVA  HANDIHALER 18 MCG inhalation capsule INHALE THE CONTENTS OF 1 CAPSULE VIA INHALATION DEVICE EVERY DAY Patient taking differently: Place 18 mcg into inhaler and inhale daily as needed (shortness of breath).  12/03/20  Yes Antonetta Rollene BRAVO, MD  tamsulosin  (FLOMAX ) 0.4 MG CAPS capsule Take 1 capsule by mouth daily.   Yes [provider]  traZODone  (DESYREL ) 100 MG tablet Take 100 mg by mouth at bedtime. 11/01/22  Yes [provider]  finasteride  (PROSCAR ) 5 MG tablet Take 1 tablet (5 mg total) by mouth daily. Patient not taking: Reported on 12/12/2024 11/02/22   Penne Knee, MD  fluticasone  (FLONASE ) 50 MCG/ACT nasal spray SHAKE LIQUID AND USE 2 SPRAYS IN EACH NOSTRIL EVERY DAY Patient not taking: Reported on 12/12/2024 04/20/22   [provider]  ipratropium-albuterol  (DUONEB) 0.5-2.5 (3) MG/3ML SOLN Take 3 mLs by nebulization every 6 (six) hours as needed. Patient not taking: Reported on 01/04/2024 10/01/20   Pahwani, Velna SAUNDERS, MD  JARDIANCE 10 MG TABS tablet Take by mouth. Patient not taking: Reported on 12/12/2024 03/12/21   [provider]  meclizine  (ANTIVERT ) 25 MG tablet Take 1 tablet (25 mg total) by mouth 3 (three) times daily as needed for dizziness. Patient not taking: Reported on 01/04/2024 10/01/20   Pahwani, Velna SAUNDERS, MD  methocarbamol  (ROBAXIN ) 500 MG tablet Take by mouth daily. As needed Patient not taking: Reported on 12/12/2024    [provider]  OVER THE COUNTER MEDICATION Apply 1 application topically daily as needed (pain). Cbd cream    [provider]   Recent Labs    12/12/24 1111  WBC 4.5  HGB 10.7*  HCT 33.9*  PLT 60*  K 4.1  CL 107  CO2 25  BUN 26*  CREATININE 1.79*  GLUCOSE 119*  CALCIUM  8.8*  INR 1.2   DG Chest Portable 1 View Result Date: 12/12/2024 CLINICAL DATA:  Medical clearance EXAM: PORTABLE CHEST 1 VIEW COMPARISON:   January 04, 2024. FINDINGS: Stable cardiomegaly. Status post surgical fixation of sternum as noted on prior study. Right lung is clear. Minimal left basilar subsegmental atelectasis is noted with minimal left pleural effusion. Postsurgical changes are noted in right shoulder. IMPRESSION: Minimal left basilar subsegmental atelectasis with minimal left pleural effusion. Electronically Signed   By: Lynwood Landy Raddle M.D.   On: 12/12/2024 13:42   DG Hip Unilat W or Wo Pelvis 2-3 Views Left Result Date: 12/12/2024 CLINICAL DATA:  Status post fall. EXAM: DG HIP (WITH OR WITHOUT PELVIS) 2-3V LEFT COMPARISON:  February 14, 2021 FINDINGS: There is an acute fracture deformity extending through the inter trochanteric region of the proximal left femur. There is no evidence of dislocation. A radiopaque intramedullary rod and compression screw device are present within the proximal right femur. Soft tissue swelling is seen along the lateral aspect of the left hip. IMPRESSION: Acute intertrochanteric fracture of the proximal left femur. Electronically Signed   By: Suzen Dials M.D.   On: 12/12/2024 12:39     Positive ROS: All other systems have been reviewed and were otherwise negative with the exception of those mentioned in the HPI and as above.  Physical Exam: BP (!) 180/77   Pulse 83   Temp 97.9 F (36.6 C) (Oral)   Resp 18   Ht 5' 7 (1.702 m)   Wt 56.2 kg   SpO2 93%   BMI 19.42 kg/m  General:  Alert, no acute distress Psychiatric:  Patient is competent for consent with normal mood and affect    Orthopedic Exam:  LLE: + DF/PF/EHL SILT grossly over foot Foot wwp +Log roll/axial load   Imaging:  As above: L intertrochanteric hip fracture  Assessment/Plan: Mario Patrick is a 84 y.o. male with a L intertrochanteric hip fracture  1. I discussed the various treatment options including both surgical and non-surgical management of the fracture with the patient and/or family (medical PoA).  We discussed the high risk of perioperative complications due to patient's age and other co-morbidities. After discussion of risks, benefits, and alternatives to surgery, the family and/or patient were in agreement to proceed with surgery. The goals of surgery would be to provide adequate pain relief and allow for mobilization. Plan for surgery is L hip cephalomedullary nailing today, 12/12/2024. 2. NPO until OR 3. Hold anticoagulation in advance of OR   Earnestine Blanch   12/12/2024 4:34 PM     [1]  Allergies Allergen Reactions   Contrast Media  [Iodinated Contrast Media] Nausea Only    GI upset    Aspirin  Other (See Comments)    Thrombocytopenia (PER PT CAN TAKE LOW DOSE ASPIRIN )   "

## 2024-12-12 NOTE — ED Notes (Signed)
 OR reports they will be coming for him around 1500.  Surgery is scheduled for 1600.

## 2024-12-12 NOTE — ED Triage Notes (Signed)
 While vacuuming wood floor, had mechanical fall and feel on Left hip. C/o Left Hip pain/ L elbow pain. Can't straighten Left leg. EMS gave 100mcg Fentanyl . Denies LOC, or hitting head. Not on Blood thinners. Noted bruising to Left elbow, thigh and toes

## 2024-12-12 NOTE — Discharge Instructions (Signed)
 Low Sodium Nutrition Therapy  Eating less sodium can help you if you have high blood pressure, heart failure, or kidney or liver disease.   Your body needs a little sodium, but too much sodium can cause your body to hold onto extra water. This extra water will raise your blood pressure and can cause damage to your heart, kidneys, or liver as they are forced to work harder.   Sometimes you can see how the extra fluid affects you because your hands, legs, or belly swell. You may also hold water around your heart and lungs, which makes it hard to breathe.   Even if you take medication for blood pressure or a water pill (diuretic) to remove fluid, it is still important to have less salt in your diet.   Check with your primary care provider before drinking alcohol since it may affect the amount of fluid in your body and how your heart, kidneys, or liver work. Sodium in Food A low-sodium meal plan limits the sodium that you get from food and beverages to 1,500-2,000 milligrams (mg) per day. Salt is the main source of sodium. Read the nutrition label on the package to find out how much sodium is in one serving of a food.  Select foods with 140 milligrams (mg) of sodium or less per serving.  You may be able to eat one or two servings of foods with a little more than 140 milligrams (mg) of sodium if you are closely watching how much sodium you eat in a day.  Check the serving size on the label. The amount of sodium listed on the label shows the amount in one serving of the food. So, if you eat more than one serving, you will get more sodium than the amount listed.  Tips Cutting Back on Sodium Eat more fresh foods.  Fresh fruits and vegetables are low in sodium, as well as frozen vegetables and fruits that have no added juices or sauces.  Fresh meats are lower in sodium than processed meats, such as bacon, sausage, and hotdogs.  Not all processed foods are unhealthy, but some processed foods may have too  much sodium.  Eat less salt at the table and when cooking. One of the ingredients in salt is sodium.  One teaspoon of table salt has 2,300 milligrams of sodium.  Leave the salt out of recipes for pasta, casseroles, and soups. Be a engineer, building services.  Food packages that say Salt-free, sodium-free, very low sodium, and low sodium have less than 140 milligrams of sodium per serving.  Beware of products identified as Unsalted, No Salt Added, Reduced Sodium, or Lower Sodium. These items may still be high in sodium. You should always check the nutrition label. Add flavors to your food without adding sodium.  Try lemon juice, lime juice, or vinegar.  Dry or fresh herbs add flavor.  Buy a sodium-free seasoning blend or make your own at home. You can purchase salt-free or sodium-free condiments like barbeque sauce in stores and online. Ask your registered dietitian nutritionist for recommendations and where to find them.   Eating in Restaurants Choose foods carefully when you eat outside your home. Restaurant foods can be very high in sodium. Many restaurants provide nutrition facts on their menus or their websites. If you cannot find that information, ask your server. Let your server know that you want your food to be cooked without salt and that you would like your salad dressing and sauces to be served on the  side.    Foods Recommended Food Group Foods Recommended  Grains Bread, bagels, rolls without salted tops Homemade bread made with reduced-sodium baking powder Cold cereals, especially shredded wheat and puffed rice Oats, grits, or cream of wheat Pastas, quinoa, and rice Popcorn, pretzels or crackers without salt Corn tortillas  Protein Foods Fresh meats and fish; turkey bacon (check the nutrition labels - make sure they are not packaged in a sodium solution) Canned or packed tuna (no more than 4 ounces at 1 serving) Beans and peas Soybeans) and tofu Eggs Nuts or nut butters  without salt  Dairy Milk or milk powder Plant milks, such as rice and soy Yogurt, including Greek yogurt Small amounts of natural cheese (blocks of cheese) or reduced-sodium cheese can be used in moderation. (Swiss, ricotta, and fresh mozzarella cheese are lower in sodium than the others) Cream Cheese Low sodium cottage cheese  Vegetables Fresh and frozen vegetables without added sauces or salt Homemade soups (without salt) Low-sodium, salt-free or sodium-free canned vegetables and soups  Fruit Fresh and canned fruits Dried fruits, such as raisins, cranberries, and prunes  Oils Tub or liquid margarine, regular or without salt Canola, corn, peanut, olive, safflower, or sunflower oils  Condiments Fresh or dried herbs such as basil, bay leaf, dill, mustard (dry), nutmeg, paprika, parsley, rosemary, sage, or thyme.  Low sodium ketchup Vinegar  Lemon or lime juice Pepper, red pepper flakes, and cayenne. Hot sauce contains sodium, but if you use just a drop or two, it will not add up to much.  Salt-free or sodium-free seasoning mixes and marinades Simple salad dressings: vinegar and oil   Foods Not Recommended Food Group Foods Not Recommended  Grains Breads or crackers topped with salt Cereals (hot/cold) with more than 300 mg sodium per serving Biscuits, cornbread, and other quick breads prepared with baking soda Pre-packaged bread crumbs Seasoned and packaged rice and pasta mixes Self-rising flours  Protein Foods Cured meats: Bacon, ham, sausage, pepperoni and hot dogs Canned meats (chili, vienna sausage, or sardines) Smoked fish and meats Frozen meals that have more than 600 mg of sodium per serving Egg substitute (with added sodium)  Dairy Buttermilk Processed cheese spreads Cottage cheese (1 cup may have over 500 mg of sodium; look for low-sodium.) American or feta cheese Shredded Cheese has more sodium than blocks of cheese String cheese  Vegetables Canned vegetables  (unless they are salt-free, sodium-free or low sodium) Frozen vegetables with seasoning and sauces Sauerkraut and pickled vegetables Canned or dried soups (unless they are salt-free, sodium-free, or low sodium) French fries and onion rings  Fruit Dried fruits preserved with additives that have sodium  Oils Salted butter or margarine, all types of olives  Condiments Salt, sea salt, kosher salt, onion salt, and garlic salt Seasoning mixes with salt Bouillon cubes Ketchup Barbeque sauce and Worcestershire sauce unless low sodium Soy sauce Salsa, pickles, olives, relish Salad dressings: ranch, blue cheese, Italian, and French.   Low Sodium Sample 1-Day Menu  Breakfast 1 cup cooked oatmeal  1 slice whole wheat bread toast  1 tablespoon peanut butter without salt  1 banana  1 cup 1% milk  Lunch Tacos made with: 2 corn tortillas   cup black beans, low sodium   cup roasted or grilled chicken (without skin)   avocado  Squeeze of lime juice  1 cup salad greens  1 tablespoon low-sodium salad dressing   cup strawberries  1 orange  Afternoon Snack 1/3 cup grapes  6 ounces yogurt  Evening Meal 3 ounces herb-baked fish  1 baked potato  2 teaspoons olive oil   cup cooked carrots  2 thick slices tomatoes on:  2 lettuce leaves  1 teaspoon olive oil  1 teaspoon balsamic vinegar  1 cup 1% milk  Evening Snack 1 apple   cup almonds without salt   Low-Sodium Vegetarian (Lacto-Ovo) Sample 1-Day Menu  Breakfast 1 cup cooked oatmeal  1 slice whole wheat toast  1 tablespoon peanut butter without salt  1 banana  1 cup 1% milk  Lunch Tacos made with: 2 corn tortillas   cup black beans, low sodium   cup roasted or grilled chicken (without skin)   avocado  Squeeze of lime juice  1 cup salad greens  1 tablespoon low-sodium salad dressing   cup strawberries  1 orange  Evening Meal Stir fry made with:  cup tofu  1 cup brown rice   cup broccoli   cup green beans   cup  peppers   tablespoon peanut oil  1 orange  1 cup 1% milk  Evening Snack 4 strips celery  2 tablespoons hummus  1 hard-boiled egg   Low-Sodium Vegan Sample 1-Day Menu  Breakfast 1 cup cooked oatmeal  1 tablespoon peanut butter without salt  1 cup blueberries  1 cup soymilk fortified with calcium , vitamin B12, and vitamin D  Lunch 1 small whole wheat pita   cup cooked lentils  2 tablespoons hummus  4 carrot sticks  1 medium apple  1 cup soymilk fortified with calcium , vitamin B12, and vitamin D  Evening Meal Stir fry made with:  cup tofu  1 cup brown rice   cup broccoli   cup green beans   cup peppers   tablespoon peanut oil  1 cup cantaloupe  Evening Snack 1 cup soy yogurt   cup mixed nuts  Copyright 2020  [employer]y [employer]. All rights reserved  Sodium Free Flavoring Tips  When cooking, the following items may be used for flavoring instead of salt or seasonings that contain sodium. Remember: A little bit of spice goes a long way! Be careful not to overseason. Spice Blend Recipe (makes about ? cup) 5 teaspoons onion powder  2 teaspoons garlic powder  2 teaspoons paprika  2 teaspoon dry mustard  1 teaspoon crushed thyme leaves   teaspoon white pepper   teaspoon celery seed Food Item Flavorings  Beef Basil, bay leaf, caraway, curry, dill, dry mustard, garlic, grape jelly, green pepper, mace, marjoram, mushrooms (fresh), nutmeg, onion or onion powder, parsley, pepper, rosemary, sage  Chicken Basil, cloves, cranberries, mace, mushrooms (fresh), nutmeg, oregano, paprika, parsley, pineapple, saffron, sage, savory, tarragon, thyme, tomato, turmeric  Egg Chervil, curry, dill, dry mustard, garlic or garlic powder, green pepper, jelly, mushrooms (fresh), nutmeg, onion powder, paprika, parsley, rosemary, tarragon, tomato  Fish Basil, bay leaf, chervil, curry, dill, dry mustard, green pepper, lemon juice, marjoram, mushrooms (fresh), paprika, pepper,  tarragon, tomato, turmeric  Lamb Cloves, curry, dill, garlic or garlic powder, mace, mint, mint jelly, onion, oregano, parsley, pineapple, rosemary, tarragon, thyme  Pork Applesauce, basil, caraway, chives, cloves, garlic or garlic powder, onion or onion powder, rosemary, thyme  Veal Apricots, basil, bay leaf, currant jelly, curry, ginger, marjoram, mushrooms (fresh), oregano, paprika  Vegetables Basil, dill, garlic or garlic powder, ginger, lemon juice, mace, marjoram, nutmeg, onion or onion powder, tarragon, tomato, sugar or sugar substitute, salt-free salad dressing, vinegar  Desserts Allspice, anise, cinnamon, cloves, ginger, mace, nutmeg, vanilla extract, other  extracts   Copyright 2020  [employer]y [employer]. All rights reserved  Fluid Restricted Nutrition Therapy  You have been prescribed this diet because your condition affects how much fluid you can eat or drink. If your heart, liver, or kidneys aren't working properly, you may not be able to effectively eliminate fluids from the body and this may cause swelling (edema) in the legs, arms, and/or stomach. Drink no more than _________ liters or ________ ounces or ________cups of fluid per day.  You don't need to stop eating or drinking the same fluids you normally would, but you may need to eat or drink less than usual.  Your registered dietitian nutritionist will help you determine the correct amount of fluid to consume during the day Breakfast Include fluids taken with medications  Lunch Include fluids taken with medications  Dinner Include fluids taken with medications  Bedtime Snack Include fluids taken with medications     Tips What Are Fluids?  A fluid is anything that is liquid or anything that would melt if left at room temperature. You will need to count these foods and liquids--including any liquid used to take medication--as part of your daily fluid intake. Some examples are: Alcohol (drink only with your  doctor's permission)  Coffee, tea, and other hot beverages  Gelatin (Jell-O)  Gravy  Ice cream, sherbet, sorbet  Ice cubes, ice chips  Milk, liquid creamer  Nutritional supplements  Popsicles  Vegetable and fruit juices; fluid in canned fruit  Watermelon  Yogurt  Soft drinks, lemonade, limeade  Soups  Syrup How Do I Measure My Fluid Intake? Record your fluid intake daily.  Tip: Every day, each time you eat or drink fluids, pour water in the same amount into an empty container that can hold the same amount of fluids you are allowed daily. This may help you keep track of how much fluid you are taking in throughout the day.  To accurately keep track of how much liquid you take in, measure the size of the cups, glasses, and bowls you use. If you eat soup, measure how much of it is liquid and how much is solid (such as noodles, vegetables, meat). Conversions for Measuring Fluid Intake  Milliliters (mL) Liters (L) Ounces (oz) Cups (c)  1000 1 32 4  1200 1.2 40 5  1500 1.5 50 6 1/4  1800 1.8 60 7 1/2  2000 2 67 8 1/3  Tips to Reduce Your Thirst Chew gum or suck on hard candy.  Rinse or gargle with mouthwash. Do not swallow.  Ice chips or popsicles my help quench thirst, but this too needs to be calculated into the total restriction. Melt ice chips or cubes first to figure out how much fluid they produce (for example, experiment with melting  cup ice chips or 2 ice cubes).  Add a lemon wedge to your water.  Limit how much salt you take in. A high salt intake might make you thirstier.  Don't eat or drink all your allowed liquids at once. Space your liquids out through the day.  Use small glasses and cups and sip slowly. If allowed, take your medications with fluids you eat or drink during a meal.   Fluid-Restricted Nutrition Therapy Sample 1-Day Menu  Breakfast 1 slice wheat toast  1 tablespoon peanut butter  1/2 cup yogurt (120 milliliters)  1/2 cup blueberries  1 cup milk (240  milliliters)   Lunch 3 ounces sliced turkey  2 slices whole wheat  bread  1/2 cup lettuce for sandwich  2 slices tomato for sandwich  1 ounce reduced-fat, reduced-sodium cheese  1/2 cup fresh carrot sticks  1 banana  1 cup unsweetened tea (240 milliliters)   Evening Meal 8 ounces soup (240 milliliters)  3 ounces salmon  1/2 cup quinoa  1 cup green beans  1 cup mixed greens salad  1 tablespoon olive oil  1 cup coffee (240 milliliters)  Evening Snack 1/2 cup sliced peaches  1/2 cup frozen yogurt (120 milliliters)  1 cup water (240 milliliters)  Copyright 2020  Academy [employer]. All rights reserved   INSTRUCTIONS AFTER Surgery  Remove items at home which could result in a fall. This includes throw rugs or furniture in walking pathways ICE to the affected joint every three hours while awake for 30 minutes at a time, for at least the first 3-5 days, and then as needed for pain and swelling.  Continue to use ice for pain and swelling. You may notice swelling that will progress down to the foot and ankle.  This is normal after surgery.  Elevate your leg when you are not up walking on it.   Continue to use the breathing machine you got in the hospital (incentive spirometer) which will help keep your temperature down.  It is common for your temperature to cycle up and down following surgery, especially at night when you are not up moving around and exerting yourself.  The breathing machine keeps your lungs expanded and your temperature down.   DIET:  As you were doing prior to hospitalization, we recommend a well-balanced diet.  DRESSING / WOUND CARE / SHOWERING  Dressing change as needed.  No showering.  Follow-up at [facility] in 2 weeks for staple removal and x-rays of the left hip  ACTIVITY  Increase activity slowly as tolerated, but follow the weight bearing instructions below.   No driving for 6 weeks or until further direction given by your  physician.  You cannot drive while taking narcotics.  No lifting or carrying greater than 10 lbs. until further directed by your surgeon. Avoid periods of inactivity such as sitting longer than an hour when not asleep. This helps prevent blood clots.  You may return to work once you are authorized by your doctor.     WEIGHT BEARING  Weightbearing as tolerated on the left   EXERCISES Gait training and ambulation training with physical therapy  CONSTIPATION  Constipation is defined medically as fewer than three stools per week and severe constipation as less than one stool per week.  Even if you have a regular bowel pattern at home, your normal regimen is likely to be disrupted due to multiple reasons following surgery.  Combination of anesthesia, postoperative narcotics, change in appetite and fluid intake all can affect your bowels.   YOU MUST use at least one of the following options; they are listed in order of increasing strength to get the job done.  They are all available over the counter, and you may need to use some, POSSIBLY even all of these options:    Drink plenty of fluids (prune juice may be helpful) and high fiber foods Colace 100 mg by mouth twice a day  Senokot for constipation as directed and as needed Dulcolax (bisacodyl ), take with full glass of water  Miralax (polyethylene glycol) once or twice a day as needed.  If you have tried all these things and are unable to have a bowel  movement in the first 3-4 days after surgery call either your surgeon or your primary doctor.    If you experience loose stools or diarrhea, hold the medications until you stool forms back up.  If your symptoms do not get better within 1 week or if they get worse, check with your doctor.  If you experience the worst abdominal pain ever or develop nausea or vomiting, please contact the office immediately for further recommendations for treatment.   ITCHING:  If you experience itching with your  medications, try taking only a single pain pill, or even half a pain pill at a time.  You can also use Benadryl over the counter for itching or also to help with sleep.   TED HOSE STOCKINGS:  Use stockings on both legs until for at least 2 weeks or as directed by physician office. They may be removed at night for sleeping.  MEDICATIONS:  See your medication summary on the After Visit Summary that nursing will review with you.  You may have some home medications which will be placed on hold until you complete the course of blood thinner medication.  It is important for you to complete the blood thinner medication as prescribed.  PRECAUTIONS:  If you experience chest pain or shortness of breath - call 911 immediately for transfer to the hospital emergency department.   If you develop a fever greater that 101 F, purulent drainage from wound, increased redness or drainage from wound, foul odor from the wound/dressing, or calf pain - CONTACT YOUR SURGEON.                                                   FOLLOW-UP APPOINTMENTS:  If you do not already have a post-op appointment, please call the office for an appointment to be seen by your surgeon.  Guidelines for how soon to be seen are listed in your After Visit Summary, but are typically between 1-4 weeks after surgery.  OTHER INSTRUCTIONS:     MAKE SURE YOU:  Understand these instructions.  Get help right away if you are not doing well or get worse.    Thank you for letting us  be a part of your medical care team.  It is a privilege we respect greatly.  We hope these instructions will help you stay on track for a fast and full recovery!

## 2024-12-12 NOTE — Transfer of Care (Signed)
 Immediate Anesthesia Transfer of Care Note  Patient: Mario Patrick  Procedure(s) Performed: FIXATION, FRACTURE, INTERTROCHANTERIC, WITH INTRAMEDULLARY ROD (Left: Hip)  Patient Location: PACU  Anesthesia Type:General  Level of Consciousness: drowsy  Airway & Oxygen Therapy: Patient Spontanous Breathing and Patient connected to face mask oxygen  Post-op Assessment: Report given to RN and Post -op Vital signs reviewed and stable  Post vital signs: Reviewed and stable  Last Vitals:  Vitals Value Taken Time  BP 92/63 12/12/24 18:13  Temp    Pulse 67 12/12/24 18:16  Resp 13 12/12/24 18:16  SpO2 100 % 12/12/24 18:16  Vitals shown include unfiled device data.  Last Pain:  Vitals:   12/12/24 1532  TempSrc: Oral  PainSc: 9          Complications: No notable events documented.

## 2024-12-12 NOTE — Anesthesia Procedure Notes (Signed)
 Procedure Name: Intubation Date/Time: 12/12/2024 4:54 PM  Performed by: Brien Sotero PARAS, CRNAPre-anesthesia Checklist: Patient identified, Patient being monitored, Timeout performed, Emergency Drugs available and Suction available Patient Re-evaluated:Patient Re-evaluated prior to induction Oxygen Delivery Method: Circle system utilized Preoxygenation: Pre-oxygenation with 100% oxygen Induction Type: IV induction Ventilation: Mask ventilation without difficulty Laryngoscope Size: Mac and 5 Grade View: Grade I Tube type: Oral Tube size: 7.0 mm Number of attempts: 1 Airway Equipment and Method: Stylet Placement Confirmation: ETT inserted through vocal cords under direct vision, positive ETCO2 and breath sounds checked- equal and bilateral Secured at: 21 cm Tube secured with: Tape Dental Injury: Teeth and Oropharynx as per pre-operative assessment

## 2024-12-13 ENCOUNTER — Encounter: Payer: Self-pay | Admitting: Orthopedic Surgery

## 2024-12-13 DIAGNOSIS — S72001A Fracture of unspecified part of neck of right femur, initial encounter for closed fracture: Secondary | ICD-10-CM | POA: Diagnosis not present

## 2024-12-13 LAB — CBC
HCT: 26.9 % — ABNORMAL LOW (ref 39.0–52.0)
Hemoglobin: 8.5 g/dL — ABNORMAL LOW (ref 13.0–17.0)
MCH: 30.4 pg (ref 26.0–34.0)
MCHC: 31.6 g/dL (ref 30.0–36.0)
MCV: 96.1 fL (ref 80.0–100.0)
Platelets: 52 K/uL — ABNORMAL LOW (ref 150–400)
RBC: 2.8 MIL/uL — ABNORMAL LOW (ref 4.22–5.81)
RDW: 14.1 % (ref 11.5–15.5)
WBC: 7.2 K/uL (ref 4.0–10.5)
nRBC: 0 % (ref 0.0–0.2)

## 2024-12-13 LAB — BASIC METABOLIC PANEL WITH GFR
Anion gap: 8 (ref 5–15)
BUN: 29 mg/dL — ABNORMAL HIGH (ref 8–23)
CO2: 24 mmol/L (ref 22–32)
Calcium: 7.9 mg/dL — ABNORMAL LOW (ref 8.9–10.3)
Chloride: 107 mmol/L (ref 98–111)
Creatinine, Ser: 1.93 mg/dL — ABNORMAL HIGH (ref 0.61–1.24)
GFR, Estimated: 34 mL/min — ABNORMAL LOW
Glucose, Bld: 122 mg/dL — ABNORMAL HIGH (ref 70–99)
Potassium: 4.5 mmol/L (ref 3.5–5.1)
Sodium: 139 mmol/L (ref 135–145)

## 2024-12-13 MED ORDER — ENOXAPARIN SODIUM 40 MG/0.4ML IJ SOSY: 40.0000 mg | PREFILLED_SYRINGE | INTRAMUSCULAR | 0 refills | Status: DC

## 2024-12-13 MED ORDER — TRAMADOL HCL 50 MG PO TABS: 50.0000 mg | ORAL_TABLET | Freq: Four times a day (QID) | ORAL | 0 refills | Status: DC | PRN

## 2024-12-13 MED ORDER — OXYCODONE HCL 5 MG PO TABS: 2.5000 mg | ORAL_TABLET | ORAL | 0 refills | Status: DC | PRN

## 2024-12-13 NOTE — Hospital Course (Addendum)
 Mario Patrick is a 84 y.o. male with medical history significant of CAD status post CABG, HTN, chronic HFpEF, CKD stage IIIb, BPH, carotid stenosis, chronic thrombocytopenia, COPD, anxiety/depression, presented with mechanical fall and right hip pain.  HPI: Patient has a chronic ambulatory impairment using roller walker to ambulate, on the morning 01/21, he was using a vacuum to clean his floor and lost his footing with a walker and fell on his right side hip, denies any lightheadedness chest pain shortness of breath before or during the episode, no head or neck injury, no LOC.  At baseline he uses roller walker to ambulate.    Hospital course / significant events:  01/21: ED Course: Afebrile, nontachycardic blood pressure 180/80 O2 saturation 97% on room air.  Hip x-ray showed intertrochanteric fracture of right hip, blood work showed creatinine 1.7 compared to baseline 2.0-2.7 BUN 26 hemoglobin 10.7 WBC 4.5 platelet 60. Calculated revised cardiac risk for primary operation low risk is at 10% significantly increased, cardiology consulted for preop clearance. Pt to OR today, underwent intramedullary nailing of Left femur  01/22: doing well postop, pending SNF placement  01/23: Hgb 6.4 early am --> 1 unit PRBC ordered. Repeat CBC pending. BP soft, systolic 80s. Avoid opiates pending improvement in BP, added midodrine  and hold lisinopril  (kept beta blocker low dose d/t afib hx) 01/24: BP improved systolic 110s. Hgb holding at 8.0. Cr 2.6, added 1L LR infuse slowly today.       Consultants:  Orthopedic surgery  Cardiology   Procedures/Surgeries: 12/12/24: Intramedullary nailing of Left femur - Dr GORMAN Blanch       ASSESSMENT & PLAN:   Right intertrochanteric femoral fracture Secondary to mechanical fall S/p Intramedullary nailing of Left femur with cephalomedullary device 12/12/24 PT/OT and anticiapte to rehab  Follow-up with [facility] clinic orthopedics in 2 weeks for staple removal and  x-rays of the left hip.  No showering. DVT Prophylaxis - Lovenox  and TED hose Weight-Bearing as tolerated to left leg   ABLA perioperatively  Contributing to hypotension Transfusing 1 unit PRBC 01/23 --> improved   Monitor CBC  Hypotension Tx ABLA as above - hold lovenox  if continued low HH or overt bleed but will continue for now d/t high risk DVT postop  Hold antihypertensive meds (keep beta blocker for rate control, low dose) Consider for midodrine    History of multivessel CAD status post CABG  Chronic HFpEF HTN HLD Euvolemic Continue home atorvastatin  80 mg daily and zetia  10 mg daily.  HOLD home lisinopril  10 mg daily given anemia and lower BP Continue metoprolol  tartrate 12.5 mg twice daily for Afib, added midodrine  and BP has improved   has not been on aspirin  due to allergy.    COPD Stable Nebs prn O2 prn    AKI on CKD stage IIIb Euvolemic, creatinine level stable but now up some today Optimized renal perfusion w/ increasing BP, unit PRBC yesterday but Cr still up-trending  IV fluids today x1L Holding lisinopril  Monitor BMP      Borderline underweight based on BMI: Body mass index is 19.42 kg/m.[name] Significantly low or high BMI is associated with higher medical risk.  Underweight - under 18  overweight - 25 to 29 obese - 30 or more Class 1 obesity: BMI of 30.0 to 34 Class 2 obesity: BMI of 35.0 to 39 Class 3 obesity: BMI of 40.0 to 49 Super Morbid Obesity: BMI 50-59 Super-super Morbid Obesity: BMI 60+ Healthy nutrition and physical activity advised as adjunct to other disease  management and risk reduction treatments    DVT prophylaxis: lovenox , TED hose  IV fluids: LE continuous IV fluids today for 1L Nutrition: regular diet Central lines / other devices: none  Code Status: DNR ACP documentation reviewed:  none on file in [facility]  TOC needs: SNf rehab placement  Medical barriers to dispo: anemia, low BP.

## 2024-12-13 NOTE — Progress Notes (Signed)
" °  Subjective: 1 Day Post-Op Procedures (LRB): FIXATION, FRACTURE, INTERTROCHANTERIC, WITH INTRAMEDULLARY ROD (Left) Patient reports pain as mild.   Patient is well, and has had no acute complaints or problems Plan is to go Rehab after hospital stay. Negative for chest pain and shortness of breath Fever: no Gastrointestinal: Negative for nausea and vomiting  Objective: Vital signs in last 24 hours: Temp:  [97.1 F (36.2 C)-98.1 F (36.7 C)] 97.6 F (36.4 C) (01/22 0303) Pulse Rate:  [59-83] 59 (01/22 0303) Resp:  [12-20] 16 (01/22 0303) BP: (92-209)/(63-93) 118/64 (01/22 0303) SpO2:  [93 %-100 %] 100 % (01/22 0303) Weight:  [56.2 kg-56.7 kg] 56.2 kg (01/21 1532)  Intake/Output from previous day:  Intake/Output Summary (Last 24 hours) at 12/13/2024 0717 Last data filed at 12/13/2024 0600 Gross per 24 hour  Intake 997.71 ml  Output 875 ml  Net 122.71 ml    Intake/Output this shift: No intake/output data recorded.  Labs: Recent Labs    12/12/24 1111 12/13/24 0349  HGB 10.7* 8.5*   Recent Labs    12/12/24 1111 12/13/24 0349  WBC 4.5 7.2  RBC 3.61* 2.80*  HCT 33.9* 26.9*  PLT 60* 52*   Recent Labs    12/12/24 1111 12/13/24 0349  NA 141 139  K 4.1 4.5  CL 107 107  CO2 25 24  BUN 26* 29*  CREATININE 1.79* 1.93*  GLUCOSE 119* 122*  CALCIUM  8.8* 7.9*   Recent Labs    12/12/24 1111  INR 1.2     EXAM General - Patient is Alert and Oriented Extremity - Neurovascular intact Sensation intact distally Dorsiflexion/Plantar flexion intact Dressing/Incision - clean, dry, no drainage Motor Function - intact, moving foot and toes well on exam.   Past Medical History:  Diagnosis Date   Anginal pain    Arthritis    Back ache    CHF (congestive heart failure) (HCC)    Chronic kidney disease    stage 3   COPD (chronic obstructive pulmonary disease) (HCC)    Coronary artery disease    recently checked carotid arteries, significant blockage   Depression     Dyspnea    Hypercholesteremia    Hypertension    Myocardial infarction (HCC)     Assessment/Plan: 1 Day Post-Op Procedures (LRB): FIXATION, FRACTURE, INTERTROCHANTERIC, WITH INTRAMEDULLARY ROD (Left) Principal Problem:   Hip fracture (HCC) Active Problems:   Hip fracture, unspecified laterality, closed, initial encounter (HCC)  Estimated body mass index is 19.42 kg/m as calculated from the following:   Height as of this encounter: 5' 7 (1.702 m).   Weight as of this encounter: 56.2 kg. Advance diet Up with therapy  Acute blood loss anemia.  Status post left hip fracture with ORIF.  Hemoglobin 8.5.  Follow with internal medicine  Discharge planning: Plan to discharge to rehab.  Follow-up with [facility] clinic orthopedics in 2 weeks for staple removal and x-rays of the left hip.  No showering.  DVT Prophylaxis - Lovenox  and TED hose Weight-Bearing as tolerated to left leg  Krystal Doyne, PA-C Orthopaedic Surgery 12/13/2024, 7:17 AM  "

## 2024-12-13 NOTE — Progress Notes (Signed)
 " PROGRESS NOTE    Mario Patrick   FMW:[id number] DOB: 12/18/1940  DOA: 12/12/2024 Date of Service: 12/13/24 which is hospital day 1  PCP: Zachary Idelia LABOR, MD    Hospital course / significant events:   Random Mario Patrick is a 84 y.o. male with medical history significant of CAD status post CABG, HTN, chronic HFpEF, CKD stage IIIb, BPH, carotid stenosis, chronic thrombocytopenia, COPD, anxiety/depression, presented with mechanical fall and right hip pain.  HPI: Patient has a chronic ambulatory impairment using roller walker to ambulate, on the morning 01/21, he was using a vacuum to clean his floor and lost his footing with a walker and fell on his right side hip, denies any lightheadedness chest pain shortness of breath before or during the episode, no head or neck injury, no LOC.  At baseline he is not active because of the multiple OA's and uses roller walker to ambulate.  No stress test in recent years.  01/21: ED Course: Afebrile, nontachycardic blood pressure 180/80 O2 saturation 97% on room air.  Hip x-ray showed intertrochanteric fracture of right hip, blood work showed creatinine 1.7 compared to baseline 2.0-2.7 BUN 26 hemoglobin 10.7 WBC 4.5 platelet 60. Calculated revised cardiac risk for primary operation low risk is at 10% significantly increased, cardiology consulted for preop clearance. Pt to OR today, underwent intramedullary nailing of Left femur  01/22: doing well postop, pending SNF placement       Consultants:  Orthopedic surgery  Cardiology   Procedures/Surgeries: 12/12/24: Intramedullary nailing of Left femur - Dr GORMAN Blanch       ASSESSMENT & PLAN:   Right intertrochanteric femoral fracture Secondary to mechanical fall S/p Intramedullary nailing of Left femur with cephalomedullary device 12/12/24 PT/OT and anticiapte to rehab  Follow-up with [facility] clinic orthopedics in 2 weeks for staple removal and x-rays of the left hip.  No showering. DVT  Prophylaxis - Lovenox  and TED hose Weight-Bearing as tolerated to left leg   History of multivessel CAD status post CABG  Chronic HFpEF HTN HLD Euvolemic Continue home atorvastatin  80 mg daily and zetia  10 mg daily.  Continue home lisinopril  10 mg daily and metoprolol  tartrate 12.5 mg twice daily --> soft BP here so placed holding parameters on the antihypertensives, see orders  Historically has not been on aspirin  due to documented allergy.    COPD Stable Nebs prn   CKD stage IIIb Euvolemic, creatinine level stable Monitor BMP  ABLA perioperatively  Monitor CBC    Borderline underweight based on BMI: Body mass index is 19.42 kg/m.[name] Significantly low or high BMI is associated with higher medical risk.  Underweight - under 18  overweight - 25 to 29 obese - 30 or more Class 1 obesity: BMI of 30.0 to 34 Class 2 obesity: BMI of 35.0 to 39 Class 3 obesity: BMI of 40.0 to 49 Super Morbid Obesity: BMI 50-59 Super-super Morbid Obesity: BMI 60+ Healthy nutrition and physical activity advised as adjunct to other disease management and risk reduction treatments    DVT prophylaxis: lovenox , TED hose  IV fluids: no continuous IV fluids  Nutrition: regular diet Central lines / other devices: none  Code Status: DNR ACP documentation reviewed:  none on file in [facility]  [facility] needs: SNf rehab placement  Medical barriers to dispo: none.            Subjective / Brief ROS:  Patient reports doing fine Denies CP/SOB.  Pain controlled overall  Denies new weakness.  Tolerating diet.  Reports no concerns w/ urination/defecation.   Family Communication: daughter at bedside     Objective Findings:  Vitals:   12/12/24 2338 12/13/24 0303 12/13/24 0742 12/13/24 1130  BP: 125/63 118/64 (!) 119/51 94/62  Pulse: 62 (!) 59 (!) 57 60  Resp: 18 16 16 17   Temp: 98.1 F (36.7 C) 97.6 F (36.4 C) 98 F (36.7 C)   TempSrc:      SpO2: 100% 100% 100% 100%  Weight:       Height:        Intake/Output Summary (Last 24 hours) at 12/13/2024 1422 Last data filed at 12/13/2024 1300 Gross per 24 hour  Intake 997.71 ml  Output 875 ml  Net 122.71 ml   Filed Weights   12/12/24 1317 12/12/24 1532  Weight: 56.7 kg 56.2 kg    Examination:  Physical Exam Constitutional:      General: He is not in acute distress. Cardiovascular:     Rate and Rhythm: Normal rate and regular rhythm.  Pulmonary:     Effort: Pulmonary effort is normal.     Breath sounds: Normal breath sounds.  Skin:    General: Skin is warm and dry.  Neurological:     Mental Status: He is alert.          Scheduled Medications:   acetaminophen   1,000 mg Oral Q8H   atorvastatin   80 mg Oral QHS   budesonide -glycopyrrolate -formoterol   2 puff Inhalation BID   docusate sodium   100 mg Oral BID   enoxaparin  (LOVENOX ) injection  30 mg Subcutaneous Q24H   ezetimibe   10 mg Oral Daily   feeding supplement  237 mL Oral BID BM   lisinopril   10 mg Oral Daily   metoprolol  tartrate  12.5 mg Oral BID   sertraline   150 mg Oral Daily   tamsulosin   0.4 mg Oral Daily   traZODone   100 mg Oral QHS    Continuous Infusions:  sodium chloride  75 mL/hr at 12/13/24 0909    PRN Medications:  albuterol , bisacodyl , HYDROmorphone  (DILAUDID ) injection, methocarbamol  **OR** methocarbamol  (ROBAXIN ) injection, metoCLOPramide  **OR** metoCLOPramide  (REGLAN ) injection, ondansetron  **OR** ondansetron  (ZOFRAN ) IV, oxyCODONE , oxyCODONE , senna-docusate, sodium phosphate, traMADol , zolpidem   Antimicrobials from admission:  Anti-infectives (From admission, onward)    Start     Dose/Rate Route Frequency Ordered Stop   12/12/24 2200  ceFAZolin  (ANCEF ) IVPB 1 g/50 mL premix        1 g 100 mL/hr over 30 Minutes Intravenous Every 6 hours 12/12/24 2100 12/13/24 1244   12/12/24 1600  ceFAZolin  (ANCEF ) IVPB 2g/100 mL premix        2 g 200 mL/hr over 30 Minutes Intravenous On call to O.R. 12/12/24 1307 12/12/24 1657            Data Reviewed:  I have personally reviewed the following...  CBC: Recent Labs  Lab 12/12/24 1111 12/13/24 0349  WBC 4.5 7.2  NEUTROABS 3.2  --   HGB 10.7* 8.5*  HCT 33.9* 26.9*  MCV 93.9 96.1  PLT 60* 52*   Basic Metabolic Panel: Recent Labs  Lab 12/12/24 1111 12/13/24 0349  NA 141 139  K 4.1 4.5  CL 107 107  CO2 25 24  GLUCOSE 119* 122*  BUN 26* 29*  CREATININE 1.79* 1.93*  CALCIUM  8.8* 7.9*   GFR: Estimated Creatinine Clearance: 23.5 mL/min (A) (by C-G formula based on SCr of 1.93 mg/dL (H)). Liver Function Tests: No results for input(s): AST, ALT, ALKPHOS, BILITOT, PROT, ALBUMIN in the last  168 hours. No results for input(s): LIPASE, AMYLASE in the last 168 hours. No results for input(s): AMMONIA in the last 168 hours. Coagulation Profile: Recent Labs  Lab 12/12/24 1111  INR 1.2   Cardiac Enzymes: No results for input(s): CKTOTAL, CKMB, CKMBINDEX, TROPONINI in the last 168 hours. BNP (last 3 results) No results for input(s): PROBNP in the last 8760 hours. HbA1C: No results for input(s): HGBA1C in the last 72 hours. CBG: No results for input(s): GLUCAP in the last 168 hours. Lipid Profile: No results for input(s): CHOL, HDL, LDLCALC, TRIG, CHOLHDL, LDLDIRECT in the last 72 hours. Thyroid  Function Tests: No results for input(s): TSH, T4TOTAL, FREET4, T3FREE, THYROIDAB in the last 72 hours. Anemia Panel: No results for input(s): VITAMINB12, FOLATE, FERRITIN, TIBC, IRON, RETICCTPCT in the last 72 hours. Most Recent Urinalysis On File:     Component Value Date/Time   COLORURINE STRAW (A) 01/04/2024 2154   APPEARANCEUR CLEAR (A) 01/04/2024 2154   APPEARANCEUR Clear 02/01/2023 1315   LABSPEC 1.005 01/04/2024 2154   PHURINE 6.0 01/04/2024 2154   GLUCOSEU 150 (A) 01/04/2024 2154   HGBUR SMALL (A) 01/04/2024 2154   BILIRUBINUR NEGATIVE 01/04/2024 2154   BILIRUBINUR Negative  02/01/2023 1315   KETONESUR NEGATIVE 01/04/2024 2154   PROTEINUR 30 (A) 01/04/2024 2154   NITRITE NEGATIVE 01/04/2024 2154   LEUKOCYTESUR TRACE (A) 01/04/2024 2154   Sepsis Labs: @LABRCNTIP (procalcitonin:4,lacticidven:4) Microbiology: Recent Results (from the past 240 hours)  Surgical PCR screen     Status: None   Collection Time: 12/12/24  2:45 PM   Specimen: Nasal Mucosa; Nasal Swab  Result Value Ref Range Status   MRSA, PCR NEGATIVE NEGATIVE Final   Staphylococcus aureus NEGATIVE NEGATIVE Final    Comment: (NOTE) The Xpert SA Assay (FDA approved for NASAL specimens in patients 58 years of age and older), is one component of a comprehensive surveillance program. It is not intended to diagnose infection nor to guide or monitor treatment. Performed at [facility], [address] [place], [place] [address]       Radiology Studies last 3 days: DG HIP UNILAT W OR W/O PELVIS 2-3 VIEWS LEFT Result Date: 12/12/2024 CLINICAL DATA:  History of arthroplasty EXAM: DG HIP (WITH OR WITHOUT PELVIS) 2-3V LEFT COMPARISON:  12/12/2024 FINDINGS: Multiple low resolution intraoperative spot views of the left hip. Total fluoroscopy time was 33 seconds, fluoroscopic dose of 3.33 mGy. The images demonstrate intramedullary rod and screw fixation of left trochanteric fracture. IMPRESSION: Intraoperative fluoroscopic assistance provided during surgical fixation of left trochanteric fracture. Electronically Signed   By: Luke Bun M.D.   On: 12/12/2024 18:13   DG C-Arm 1-60 Min-No Report Result Date: 12/12/2024 Fluoroscopy was utilized by the requesting physician.  No radiographic interpretation.   DG C-Arm 1-60 Min-No Report Result Date: 12/12/2024 Fluoroscopy was utilized by the requesting physician.  No radiographic interpretation.   DG Chest Portable 1 View Result Date: 12/12/2024 CLINICAL DATA:  Medical clearance EXAM: PORTABLE CHEST 1 VIEW COMPARISON:  January 04, 2024. FINDINGS:  Stable cardiomegaly. Status post surgical fixation of sternum as noted on prior study. Right lung is clear. Minimal left basilar subsegmental atelectasis is noted with minimal left pleural effusion. Postsurgical changes are noted in right shoulder. IMPRESSION: Minimal left basilar subsegmental atelectasis with minimal left pleural effusion. Electronically Signed   By: Lynwood Landy Raddle M.D.   On: 12/12/2024 13:42   DG Hip Unilat W or Wo Pelvis 2-3 Views Left Result Date: 12/12/2024 CLINICAL DATA:  Status  post fall. EXAM: DG HIP (WITH OR WITHOUT PELVIS) 2-3V LEFT COMPARISON:  February 14, 2021 FINDINGS: There is an acute fracture deformity extending through the inter trochanteric region of the proximal left femur. There is no evidence of dislocation. A radiopaque intramedullary rod and compression screw device are present within the proximal right femur. Soft tissue swelling is seen along the lateral aspect of the left hip. IMPRESSION: Acute intertrochanteric fracture of the proximal left femur. Electronically Signed   By: Suzen Dials M.D.   On: 12/12/2024 12:39         Yaminah Clayborn, DO [facility] 12/13/2024, 2:22 PM    Dictation software may have been used to generate the above note. Typos may occur and escape review in typed/dictated notes. Please contact Dr Marsa directly for clarity if needed.  Staff may message me via secure chat in Epic  but this may not receive an immediate response,  please page me for urgent matters!  If 7PM-7AM, please contact night coverage [email or web address]       "

## 2024-12-13 NOTE — Evaluation (Signed)
 Occupational Therapy Evaluation Patient Details Name: Mario Patrick MRN: [id number] DOB: 1941-10-14 Today's Date: 12/13/2024   History of Present Illness   presented to ER secondary to mechanical fall with acute onset of R hip pain; admitted for management of intertrochanteric hip fracture, s/p IM nailing (1/21), WBAT     Clinical Impressions Patient was seen for OT evaluation this date. Prior to hospital admission, patient was living alone, but reports having excellent support from his DIL. Patient requires some A at baseline with IADLs but reports being able to manage his ADLs independently. Patient fell while vacuuming and sustained a hip fx which was surgically repaired, he is WBAT. He is very pain limited, crepitus in B shoulders/elbows related to OA. He requires max A for bed mobility; mod-max A x 2 for SPT from EOB to recliner with increased time and multimodal cues for weight shifting/off loading. Patient presents with deficits in functional mobility related to pain, affecting safe and optimal ADL completion. Patient is currently requiring max A for ADLs, mobillity and transfers.  Paient would benefit from skilled OT services to address noted impairments and functional limitations (see below for any additional details) in order to maximize safety and independence while minimizing future risk of falls, injury, and readmission. Anticipate the need for follow up OT services upon acute hospital DC.      If plan is discharge home, recommend the following:   A lot of help with walking and/or transfers;A lot of help with bathing/dressing/bathroom     Functional Status Assessment   Patient has had a recent decline in their functional status and demonstrates the ability to make significant improvements in function in a reasonable and predictable amount of time.     Equipment Recommendations   None recommended by OT     Recommendations for Other Services          Precautions/Restrictions   Precautions Precautions: Fall Recall of Precautions/Restrictions: Intact Restrictions Weight Bearing Restrictions Per Provider Order: Yes LLE Weight Bearing Per Provider Order: Weight bearing as tolerated     Mobility Bed Mobility Overal bed mobility: Needs Assistance Bed Mobility: Sit to Supine       Sit to supine: Max assist   General bed mobility comments: A for move BLE and to lift trunk    Transfers Overall transfer level: Needs assistance Equipment used: Rolling walker (2 wheels) Transfers: Bed to chair/wheelchair/BSC       Step pivot transfers: Mod assist, Max assist     General transfer comment: multimodal cues for sequencing weight shifting to off load LLE      Balance Overall balance assessment: Needs assistance Sitting-balance support: Bilateral upper extremity supported, Feet supported Sitting balance-Leahy Scale: Fair   Postural control: Left lateral lean Standing balance support: Reliant on assistive device for balance Standing balance-Leahy Scale: Poor                             ADL either performed or assessed with clinical judgement   ADL Overall ADL's : Needs assistance/impaired                                       General ADL Comments: anticipate patient will require max A for LB self care tasks due to pain with L hip flexion; set up A for UB self care tasks     Vision  Perception         Praxis         Pertinent Vitals/Pain Pain Assessment Pain Assessment: 0-10 Pain Score: 8  Pain Location: L hip Pain Descriptors / Indicators: Aching, Grimacing, Guarding Pain Intervention(s): Monitored during session, Limited activity within patient's tolerance     Extremity/Trunk Assessment Upper Extremity Assessment Upper Extremity Assessment: Generalized weakness   Lower Extremity Assessment Lower Extremity Assessment: Defer to PT evaluation   Cervical / Trunk  Assessment Cervical / Trunk Assessment: Kyphotic   Communication Communication Communication: Impaired Factors Affecting Communication: Hearing impaired   Cognition Arousal: Alert Behavior During Therapy: WFL for tasks assessed/performed Cognition: No apparent impairments                               Following commands: Intact       Cueing  General Comments   Cueing Techniques: Verbal cues;Gestural cues;Tactile cues  patient on 2L throughout tx, unable to get consistent reading, O2 remained at 2L   Exercises     Shoulder Instructions      Home Living Family/patient expects to be discharged to:: Private residence Living Arrangements: Alone Available Help at Discharge: Family;Available PRN/intermittently Type of Home: House Home Access: Ramped entrance     Home Layout: Two level;Able to live on main level with bedroom/bathroom     Bathroom Shower/Tub: Producer, Television/film/video: Handicapped height     Home Equipment: Shower seat - built in;Grab bars - tub/shower;Grab bars - toilet;Cane - single point;Rolling Walker (2 wheels);Wheelchair - manual          Prior Functioning/Environment Prior Level of Function : Needs assist             Mobility Comments: Pt reports being modified independent ambulating with SPC; no recent falls; uses manual w/c in community.  No home O2. ADLs Comments: Pt's DIL assists with changing linens, meal preparation.  Pt reports he is currently not driving.    OT Problem List: Decreased strength;Decreased activity tolerance;Impaired balance (sitting and/or standing);Decreased knowledge of use of DME or AE   OT Treatment/Interventions: Self-care/ADL training;Therapeutic exercise;Energy conservation;DME and/or AE instruction;Therapeutic activities;Balance training;Patient/family education      OT Goals(Current goals can be found in the care plan section)   Acute Rehab OT Goals Patient Stated Goal: to have less  pain OT Goal Formulation: With patient Time For Goal Achievement: 12/27/24 Potential to Achieve Goals: Good ADL Goals Pt Will Perform Grooming: with contact guard assist;standing Pt Will Perform Lower Body Dressing: with min assist;sit to/from stand Pt Will Transfer to Toilet: with contact guard assist;stand pivot transfer;ambulating;bedside commode   OT Frequency:  Min 2X/week    Co-evaluation              AM-PAC OT 6 Clicks Daily Activity     Outcome Measure Help from another person eating meals?: None Help from another person taking care of personal grooming?: A Little Help from another person toileting, which includes using toliet, bedpan, or urinal?: A Lot Help from another person bathing (including washing, rinsing, drying)?: A Lot Help from another person to put on and taking off regular upper body clothing?: A Little Help from another person to put on and taking off regular lower body clothing?: A Lot 6 Click Score: 16   End of Session Equipment Utilized During Treatment: Rolling walker (2 wheels);Gait belt Nurse Communication: Mobility status  Activity Tolerance: Patient limited by pain Patient  left: in chair;with call bell/phone within reach;with chair alarm set  OT Visit Diagnosis: Unsteadiness on feet (R26.81);Repeated falls (R29.6);Muscle weakness (generalized) (M62.81);History of falling (Z91.81)                Time: 9046-8974 OT Time Calculation (min): 32 min Charges:  OT General Charges $OT Visit: 1 Visit OT Evaluation $OT Eval Low Complexity: 1 Low OT Treatments $Self Care/Home Management : 8-22 mins  Rogers Clause, OT/L MSOT, 12/13/2024

## 2024-12-13 NOTE — Progress Notes (Signed)
 " [facility] CLINIC CARDIOLOGY PROGRESS NOTE       Patient ID: Mario Patrick MRN: [id number] DOB/AGE: May 13, 1941 84 y.o.  Admit date: 12/12/2024 Referring Physician Dr. Cort Mana Primary Physician Zachary Idelia LABOR, MD  Primary Cardiologist Dr. Cara Lovelace Reason for Consultation POC  HPI: Mario Patrick is a 84 y.o. 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  Found to have hip fracture, orthopedics is recommending surgery.  Cardiology was consulted for preoperative evaluation.   Interval history: - Patient seen and examined this morning, resting comfortably in hospital bed with no family at bedside. - States that he does have some discomfort in his left hip this morning.  Denies any chest discomfort or shortness of breath. - States that he has gotten his days mixed up and thought that it was Tuesday but is now aware that it is Thursday.  Review of systems complete and found to be negative unless listed above    Past Medical History:  Diagnosis Date   Anginal pain    Arthritis    Back ache    CHF (congestive heart failure) (HCC)    Chronic kidney disease    stage 3   COPD (chronic obstructive pulmonary disease) (HCC)    Coronary artery disease    recently checked carotid arteries, significant blockage   Depression    Dyspnea    Hypercholesteremia    Hypertension    Myocardial infarction [facility])     Past Surgical History:  Procedure Laterality Date   AORTIC VALVE REPLACEMENT     BACK SURGERY     CARDIAC SURGERY     CABG X4   COLONOSCOPY     INTRAMEDULLARY (IM) NAIL INTERTROCHANTERIC Left 12/12/2024   Procedure: FIXATION, FRACTURE, INTERTROCHANTERIC, WITH INTRAMEDULLARY ROD;  Surgeon: Tobie Priest, MD;  Location: [facility] ORS;  Service: Orthopedics;  Laterality: Left;   IR KYPHO LUMBAR INC FX REDUCE BONE BX UNI/BIL CANNULATION INC/IMAGING  09/13/2024   IR KYPHO THORACIC WITH BONE BIOPSY  09/13/2024   IR RADIOLOGIST EVAL & MGMT  09/12/2024   IR RADIOLOGIST EVAL & MGMT  10/04/2024   KYPHOPLASTY N/A 07/16/2021   Procedure: L1 KYPHOPLASTY;  Surgeon: Kathlynn Sharper, MD;  Location: [facility] ORS;  Service: Orthopedics;  Laterality: N/A;    Medications Prior to Admission  Medication Sig Dispense Refill Last Dose/Taking   acetaminophen  (TYLENOL ) 500 MG tablet Take 500 mg by mouth every 6 (six) hours as needed for moderate pain.   Unknown   albuterol  (VENTOLIN  HFA) 108 (90 Base) MCG/ACT inhaler Inhale 1-2 puffs into the lungs every 6 (six) hours as needed for wheezing or shortness of breath. (Patient taking differently: Inhale 2 puffs into the lungs every 6 (six) hours as needed for wheezing or shortness of breath.) 1 each 0 Unknown   amLODipine (NORVASC) 2.5 MG tablet Take 2.5 mg by mouth daily.   12/12/2024   atorvastatin  (LIPITOR ) 80 MG tablet Take 1 tablet by mouth at bedtime.   12/11/2024   desonide (DESOWEN) 0.05 % ointment Apply 1 Application topically 2 (two) times daily.   Taking   ezetimibe  (ZETIA ) 10 MG tablet Take 1 tablet (10 mg total) by mouth daily. 30 tablet 1 12/12/2024   Fluticasone -Salmeterol (ADVAIR) 250-50 MCG/DOSE  AEPB Inhale 1 puff into the lungs 2 (two) times daily. (Patient taking differently: Inhale 1 puff into the lungs 2 (two) times daily as needed (shortness of breath).) 60 each 0 Unknown   lisinopril  (ZESTRIL ) 10 MG tablet Take 10 mg by mouth daily.   12/12/2024   metoprolol  succinate (TOPROL -XL) 25 MG 24 hr tablet Take 12.5 mg by mouth daily.   12/12/2024   mirtazapine  (REMERON ) 7.5 MG tablet Take 7.5 mg by mouth at bedtime.   12/11/2024   nitroGLYCERIN  (NITROSTAT ) 0.4 MG SL tablet Place 0.4 mg under the tongue every 5 (five) minutes as needed for chest pain.   Unknown    nystatin (MYCOSTATIN/NYSTOP) powder Apply 1 Application topically 2 (two) times daily.   Taking   sertraline  (ZOLOFT ) 100 MG tablet Take 1 tablet (100 mg total) by mouth daily. (Patient taking differently: Take 150 mg by mouth daily.) 30 tablet 0 12/11/2024   SPIRIVA  HANDIHALER 18 MCG inhalation capsule INHALE THE CONTENTS OF 1 CAPSULE VIA INHALATION DEVICE EVERY DAY (Patient taking differently: Place 18 mcg into inhaler and inhale daily as needed (shortness of breath).) 30 capsule 0 Unknown   tamsulosin  (FLOMAX ) 0.4 MG CAPS capsule Take 1 capsule by mouth daily.   12/11/2024   traZODone  (DESYREL ) 100 MG tablet Take 100 mg by mouth at bedtime.   12/11/2024   finasteride  (PROSCAR ) 5 MG tablet Take 1 tablet (5 mg total) by mouth daily. (Patient not taking: Reported on 12/12/2024) 90 tablet 3 Not Taking   fluticasone  (FLONASE ) 50 MCG/ACT nasal spray SHAKE LIQUID AND USE 2 SPRAYS IN EACH NOSTRIL EVERY DAY (Patient not taking: Reported on 12/12/2024)   Not Taking   ipratropium-albuterol  (DUONEB) 0.5-2.5 (3) MG/3ML SOLN Take 3 mLs by nebulization every 6 (six) hours as needed. (Patient not taking: Reported on 01/04/2024) 360 mL 0    JARDIANCE 10 MG TABS tablet Take by mouth. (Patient not taking: Reported on 12/12/2024)   Not Taking   meclizine  (ANTIVERT ) 25 MG tablet Take 1 tablet (25 mg total) by mouth 3 (three) times daily as needed for dizziness. (Patient not taking: Reported on 01/04/2024) 30 tablet 0    methocarbamol  (ROBAXIN ) 500 MG tablet Take by mouth daily. As needed (Patient not taking: Reported on 12/12/2024)   Not Taking   OVER THE COUNTER MEDICATION Apply 1 application topically daily as needed (pain). Cbd cream      Social History   Socioeconomic History   Marital status: Widowed    Spouse name: Not on file   Number of children: Not on file   Years of education: Not on file   Highest education level: Not on file  Occupational History   Not on file  Tobacco Use   Smoking status: Former    Smokeless tobacco: Never  Vaping Use   Vaping status: Never Used  Substance and Sexual Activity   Alcohol use: No   Drug use: Never   Sexual activity: Not on file  Other Topics Concern   Not on file  Social History Narrative   Lives with house mate.   Social Drivers of Health   Tobacco Use: Medium Risk (12/12/2024)   Patient History    Smoking Tobacco Use: Former    Smokeless Tobacco Use: Never    Passive Exposure: Not on file  Financial Resource Strain: Low Risk  (11/07/2024)   Received from [facility]   Overall Financial Resource Strain (CARDIA)    Difficulty of Paying Living Expenses: Not hard at  all  Food Insecurity: No Food Insecurity (12/12/2024)   Epic    Worried About Programme Researcher, Broadcasting/film/video in the Last Year: Never true    Ran Out of Food in the Last Year: Never true  Transportation Needs: No Transportation Needs (12/12/2024)   Epic    Lack of Transportation (Medical): No    Lack of Transportation (Non-Medical): No  Physical Activity: Not on file  Stress: Not on file  Social Connections: Unknown (12/12/2024)   Social Connection and Isolation Panel    Frequency of Communication with Friends and Family: Three times a week    Frequency of Social Gatherings with Friends and Family: Three times a week    Attends Religious Services: More than 4 times per year    Active Member of Clubs or Organizations: Yes    Attends Banker Meetings: More than 4 times per year    Marital Status: Not on file  Intimate Partner Violence: Not At Risk (12/12/2024)   Epic    Fear of Current or Ex-Partner: No    Emotionally Abused: No    Physically Abused: No    Sexually Abused: No  Depression (PHQ2-9): Not on file  Alcohol Screen: Not on file  Housing: Low Risk (12/12/2024)   Epic    Unable to Pay for Housing in the Last Year: No    Number of Times Moved in the Last Year: 0    Homeless in the Last Year: No  Utilities: Not At Risk (12/12/2024)   Epic     Threatened with loss of utilities: No  Health Literacy: Not on file    Family History  Problem Relation Age of Onset   Lung disease Mother    Cancer Father      Vitals:   12/12/24 2015 12/12/24 2338 12/13/24 0303 12/13/24 0742  BP: (!) 154/68 125/63 118/64 (!) 119/51  Pulse: 67 62 (!) 59 (!) 57  Resp: 18 18 16 16   Temp: 97.9 F (36.6 C) 98.1 F (36.7 C) 97.6 F (36.4 C) 98 F (36.7 C)  TempSrc:      SpO2: 100% 100% 100% 100%  Weight:      Height:        PHYSICAL EXAM General: Chronically ill appearing elderly male, well nourished, in no acute distress. HEENT: Normocephalic and atraumatic. Neck: No JVD.  Lungs: Normal respiratory effort on room air. Clear bilaterally to auscultation. No wheezes, crackles, rhonchi.  Heart: HRRR. Normal S1 and S2 without gallops or murmurs.  Abdomen: Non-distended appearing.  Msk: Normal strength and tone for age. Extremities: Warm and well perfused. No clubbing, cyanosis. No edema.  Neuro: Alert and oriented X 3. Psych: Answers questions appropriately.   Labs: Basic Metabolic Panel: Recent Labs    12/12/24 1111 12/13/24 0349  NA 141 139  K 4.1 4.5  CL 107 107  CO2 25 24  GLUCOSE 119* 122*  BUN 26* 29*  CREATININE 1.79* 1.93*  CALCIUM  8.8* 7.9*   Liver Function Tests: No results for input(s): AST, ALT, ALKPHOS, BILITOT, PROT, ALBUMIN in the last 72 hours. No results for input(s): LIPASE, AMYLASE in the last 72 hours. CBC: Recent Labs    12/12/24 1111 12/13/24 0349  WBC 4.5 7.2  NEUTROABS 3.2  --   HGB 10.7* 8.5*  HCT 33.9* 26.9*  MCV 93.9 96.1  PLT 60* 52*   Cardiac Enzymes: No results for input(s): CKTOTAL, CKMB, CKMBINDEX, TROPONINIHS in the last 72 hours. BNP: No results for input(s): BNP  in the last 72 hours. D-Dimer: No results for input(s): DDIMER in the last 72 hours. Hemoglobin A1C: No results for input(s): HGBA1C in the last 72 hours. Fasting Lipid Panel: No results for  input(s): CHOL, HDL, LDLCALC, TRIG, CHOLHDL, LDLDIRECT in the last 72 hours. Thyroid  Function Tests: No results for input(s): TSH, T4TOTAL, T3FREE, THYROIDAB in the last 72 hours.  Invalid input(s): FREET3 Anemia Panel: No results for input(s): VITAMINB12, FOLATE, FERRITIN, TIBC, IRON, RETICCTPCT in the last 72 hours.   Radiology: DG HIP UNILAT W OR W/O PELVIS 2-3 VIEWS LEFT Result Date: 12/12/2024 CLINICAL DATA:  History of arthroplasty EXAM: DG HIP (WITH OR WITHOUT PELVIS) 2-3V LEFT COMPARISON:  12/12/2024 FINDINGS: Multiple low resolution intraoperative spot views of the left hip. Total fluoroscopy time was 33 seconds, fluoroscopic dose of 3.33 mGy. The images demonstrate intramedullary rod and screw fixation of left trochanteric fracture. IMPRESSION: Intraoperative fluoroscopic assistance provided during surgical fixation of left trochanteric fracture. Electronically Signed   By: Luke Bun M.D.   On: 12/12/2024 18:13   DG C-Arm 1-60 Min-No Report Result Date: 12/12/2024 Fluoroscopy was utilized by the requesting physician.  No radiographic interpretation.   DG C-Arm 1-60 Min-No Report Result Date: 12/12/2024 Fluoroscopy was utilized by the requesting physician.  No radiographic interpretation.   DG Chest Portable 1 View Result Date: 12/12/2024 CLINICAL DATA:  Medical clearance EXAM: PORTABLE CHEST 1 VIEW COMPARISON:  January 04, 2024. FINDINGS: Stable cardiomegaly. Status post surgical fixation of sternum as noted on prior study. Right lung is clear. Minimal left basilar subsegmental atelectasis is noted with minimal left pleural effusion. Postsurgical changes are noted in right shoulder. IMPRESSION: Minimal left basilar subsegmental atelectasis with minimal left pleural effusion. Electronically Signed   By: Lynwood Landy Raddle M.D.   On: 12/12/2024 13:42   DG Hip Unilat W or Wo Pelvis 2-3 Views Left Result Date: 12/12/2024 CLINICAL DATA:  Status post fall.  EXAM: DG HIP (WITH OR WITHOUT PELVIS) 2-3V LEFT COMPARISON:  February 14, 2021 FINDINGS: There is an acute fracture deformity extending through the inter trochanteric region of the proximal left femur. There is no evidence of dislocation. A radiopaque intramedullary rod and compression screw device are present within the proximal right femur. Soft tissue swelling is seen along the lateral aspect of the left hip. IMPRESSION: Acute intertrochanteric fracture of the proximal left femur. Electronically Signed   By: Suzen Dials M.D.   On: 12/12/2024 12:39    ECHO 12/2023: 1. Left ventricular ejection fraction, by estimation, is 60 to 65%. The left ventricle has normal function. The left ventricle has no regional wall motion abnormalities. There is moderate left ventricular hypertrophy. Left ventricular diastolic parameters are consistent with Grade I diastolic dysfunction (impaired relaxation).   2. Right ventricular systolic function is normal. The right ventricular size is normal. Tricuspid regurgitation signal is inadequate for assessing PA pressure.   3. Left atrial size was mild to moderately dilated.   4. The mitral valve is normal in structure. Mild mitral valve  regurgitation. No evidence of mitral stenosis.   5. The aortic valve is normal in structure. There is mild calcification of the aortic valve. Aortic valve regurgitation is not visualized. Aortic valve sclerosis/calcification is present, without any evidence of aortic stenosis.   6. The inferior vena cava is normal in size with greater than 50%  respiratory variability, suggesting right atrial pressure of 3 mmHg.   TELEMETRY (personally reviewed): not on tele  EKG (personally reviewed): NSR, ST-T changes in  lateral leads which appear similar to prior  Data reviewed by me 12/13/2024: last 24h vitals tele labs imaging I/O ED provider note, admission H&P, hospitalist progress note, Ortho notes  Principal Problem:   Hip fracture  (HCC) Active Problems:   Hip fracture, unspecified laterality, closed, initial encounter (HCC)    ASSESSMENT AND PLAN:  Mishael Haran Bassford is a 84 y.o. 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  Found to have hip fracture, orthopedics is recommending surgery.  Cardiology was consulted for preoperative evaluation.   # Preoperative cardiac evaluation # Fall with L hip fx # Coronary artery disease s/p CABG # Chronic HFpEF # Hypertension # Hyperlipidemia # COPD # CKD Patient presented after fall this AM found to have fracture of L proximal femur. Ortho suggests surgery for repair. No complaints of CP, SOB, exertional symptoms recently. Last echo 2/25 with preserved EF and no WMAs. LHC 2022 with patent grafts. EKG today demonstrates NSR with ST-T wave changes similar to prior.  S/p intramedullary nailing of left femur 12/12/2024. - Continue home atorvastatin  80 mg daily and zetia  10 mg daily.  - Continue home lisinopril  10 mg daily and metoprolol  tartrate 12.5 mg twice daily.  - Historically has not been on aspirin  due to documented allergy.   This patient's plan of care was discussed and created with Dr. Florencio and he is in agreement.  Signed: Danita Bloch, PA-C  12/13/2024, 9:01 AM [facility]      "

## 2024-12-13 NOTE — Plan of Care (Signed)
   Problem: Education: Goal: Knowledge of General Education information will improve Description Including pain rating scale, medication(s)/side effects and non-pharmacologic comfort measures Outcome: Progressing

## 2024-12-13 NOTE — Evaluation (Signed)
 Physical Therapy Evaluation Patient Details Name: Mario Patrick MRN: [id number] DOB: 1940/11/27 Today's Date: 12/13/2024  History of Present Illness  presented to ER secondary to mechanical fall with acute onset of R hip pain; admitted for management of intertrochanteric hip fracture, s/p IM nailing (1/21), WBAT  Clinical Impression  Patient resting in bed upon arrival to session; alert and oriented to basic information, follows commands with increased time for processing. HOH at baseline, appears to hear best from R ear. At baseline, lives alone in two-level home with bed/bath on main level of home (no essential needs on upper level).  Ambulatory with RW for household distances and manual WC for longer, community distances.  No home O2. Denies fall history beyond this event.  Endorses having supportive son and daughter-in-law that provide assist as needed. On evaluation, patient rates L hip pain 8/10; meds received prior to session.  Tends to maintain R UE in flexed position, but does relax with max cuing from therapist.  Bilat LEs generally guarded and somewhat rigid (due to pain), requiring act assist from therapist to mobilize.  Currently requiring max assist for bed mobility; mod assist +2 for sit/stand, min assist +1-2 for static standing balance and mod/max assist +2 for SPT with RW.  Requires extensive assist to stabilize, maintain balance during L LE loading/WBing (limited tolerance, increased risk for buckling due to pain).  Step by step cuing with increased time to process walker position, management and overall transfer sequence.  Additional gait deferred due to pain; will continue to assess/progress in subsequent sessions as appropriate. Would benefit from skilled PT to address above deficits and promote optimal return to PLOF.; recommend post-acute PT follow up as indicated by interdisciplinary care team.          If plan is discharge home, recommend the following: Two people to  help with walking and/or transfers;Two people to help with bathing/dressing/bathroom   Can travel by private vehicle        Equipment Recommendations    Recommendations for Other Services       Functional Status Assessment Patient has had a recent decline in their functional status and demonstrates the ability to make significant improvements in function in a reasonable and predictable amount of time.     Precautions / Restrictions Precautions Precautions: Fall Restrictions Weight Bearing Restrictions Per Provider Order: Yes RLE Weight Bearing Per Provider Order: Weight bearing as tolerated LLE Weight Bearing Per Provider Order: Weight bearing as tolerated      Mobility  Bed Mobility Overal bed mobility: Needs Assistance Bed Mobility: Supine to Sit     Supine to sit: Max assist     General bed mobility comments: assist for LE management, truncal elevation and scooting towards edge of bed    Transfers Overall transfer level: Needs assistance Equipment used: Rolling walker (2 wheels) Transfers: Sit to/from Stand, Bed to chair/wheelchair/BSC Sit to Stand: Mod assist, +2 safety/equipment   Step pivot transfers: Mod assist, +2 safety/equipment, Max assist       General transfer comment: extensive assist to stabilize, maintain balance during L LE loading/WBing (limited tolerance, increased risk for buckling due to pain).  Step by step cuing with increased time to process walker position, management and overall transfer sequence    Ambulation/Gait               General Gait Details: deferred due to pain  Stairs            Wheelchair Mobility  Tilt Bed    Modified Rankin (Stroke Patients Only)       Balance Overall balance assessment: Needs assistance Sitting-balance support: No upper extremity supported, Feet supported Sitting balance-Leahy Scale: Fair   Postural control: Left lateral lean Standing balance support: Reliant on assistive  device for balance Standing balance-Leahy Scale: Poor Standing balance comment: +2 with RW                             Pertinent Vitals/Pain Pain Assessment Pain Assessment: 0-10 Pain Score: 8  Pain Location: L hip Pain Descriptors / Indicators: Aching, Grimacing, Guarding Pain Intervention(s): Limited activity within patient's tolerance, Monitored during session, Repositioned, Premedicated before session    Home Living Family/patient expects to be discharged to:: Private residence Living Arrangements: Alone Available Help at Discharge: Family;Available PRN/intermittently Type of Home: House Home Access: Ramped entrance       Home Layout: Two level;Able to live on main level with bedroom/bathroom Home Equipment: Shower seat - built in;Grab bars - tub/shower;Grab bars - toilet;Cane - single point;Rolling Walker (2 wheels);Wheelchair - manual      Prior Function Prior Level of Function : Needs assist             Mobility Comments: Pt reports being modified independent ambulating with SPC; no recent falls; uses manual w/c in community.  No home O2. ADLs Comments: Pt's DIL assists with changing linens, meal preparation.  Pt reports he is currently not driving.     Extremity/Trunk Assessment   Upper Extremity Assessment Upper Extremity Assessment: Generalized weakness    Lower Extremity Assessment Lower Extremity Assessment:  (R LE grossly at least 3 to 4-/5 throughout (tends to maintain in somewhat rigid, guarded position); L LE grossly at least 3-/5, limited by pain)    Cervical / Trunk Assessment Cervical / Trunk Assessment: Kyphotic  Communication   Communication Communication: Impaired Factors Affecting Communication: Hearing impaired    Cognition Arousal: Alert Behavior During Therapy: WFL for tasks assessed/performed   PT - Cognitive impairments: No apparent impairments                         Following commands: Intact        Cueing Cueing Techniques: Verbal cues, Gestural cues, Tactile cues     General Comments General comments (skin integrity, edema, etc.): patient on 2L throughout tx, unable to get consistent reading, O2 remained at 2L    Exercises     Assessment/Plan    PT Assessment Patient needs continued PT services  PT Problem List Decreased strength;Decreased range of motion;Decreased safety awareness;Decreased activity tolerance;Decreased balance;Decreased mobility;Decreased coordination;Decreased cognition;Decreased knowledge of use of DME;Decreased knowledge of precautions;Pain       PT Treatment Interventions DME instruction;Gait training;Functional mobility training;Therapeutic activities;Therapeutic exercise;Balance training;Cognitive remediation;Patient/family education    PT Goals (Current goals can be found in the Care Plan section)  Acute Rehab PT Goals Patient Stated Goal: agreeable to participation with session PT Goal Formulation: With patient Time For Goal Achievement: 12/27/24 Potential to Achieve Goals: Good    Frequency 7X/week     Co-evaluation PT/OT/SLP Co-Evaluation/Treatment: Yes Reason for Co-Treatment: Complexity of the patient's impairments (multi-system involvement) PT goals addressed during session: Mobility/safety with mobility OT goals addressed during session: ADL's and self-care       AM-PAC PT 6 Clicks Mobility  Outcome Measure Help needed turning from your back to your side while in a flat bed without  using bedrails?: A Lot Help needed moving from lying on your back to sitting on the side of a flat bed without using bedrails?: A Lot Help needed moving to and from a bed to a chair (including a wheelchair)?: A Lot Help needed standing up from a chair using your arms (e.g., wheelchair or bedside chair)?: A Lot Help needed to walk in hospital room?: A Lot Help needed climbing 3-5 steps with a railing? : A Lot 6 Click Score: 12    End of Session  Equipment Utilized During Treatment: Gait belt Activity Tolerance: Patient tolerated treatment well;Patient limited by pain Patient left: in chair;with call bell/phone within reach;with chair alarm set Nurse Communication: Mobility status PT Visit Diagnosis: Muscle weakness (generalized) (M62.81);Difficulty in walking, not elsewhere classified (R26.2);Pain Pain - Right/Left: Left Pain - part of body: Hip    Time: 9043-8976 PT Time Calculation (min) (ACUTE ONLY): 27 min   Charges:   PT Evaluation $PT Eval Moderate Complexity: 1 Mod   PT General Charges $$ ACUTE PT VISIT: 1 Visit         Dartanyan Deasis H. Delores, PT, DPT, NCS 12/13/24, 12:19 PM [phone]

## 2024-12-14 DIAGNOSIS — S72001A Fracture of unspecified part of neck of right femur, initial encounter for closed fracture: Secondary | ICD-10-CM | POA: Diagnosis not present

## 2024-12-14 DIAGNOSIS — E440 Moderate protein-calorie malnutrition: Secondary | ICD-10-CM | POA: Insufficient documentation

## 2024-12-14 LAB — BASIC METABOLIC PANEL WITH GFR
Anion gap: 8 (ref 5–15)
BUN: 36 mg/dL — ABNORMAL HIGH (ref 8–23)
CO2: 24 mmol/L (ref 22–32)
Calcium: 7.9 mg/dL — ABNORMAL LOW (ref 8.9–10.3)
Chloride: 104 mmol/L (ref 98–111)
Creatinine, Ser: 2.5 mg/dL — ABNORMAL HIGH (ref 0.61–1.24)
GFR, Estimated: 25 mL/min — ABNORMAL LOW
Glucose, Bld: 123 mg/dL — ABNORMAL HIGH (ref 70–99)
Potassium: 3.8 mmol/L (ref 3.5–5.1)
Sodium: 136 mmol/L (ref 135–145)

## 2024-12-14 LAB — ABO/RH: ABO/RH(D): O POS

## 2024-12-14 LAB — HEMOGLOBIN AND HEMATOCRIT, BLOOD
HCT: 27.3 % — ABNORMAL LOW (ref 39.0–52.0)
Hemoglobin: 8.9 g/dL — ABNORMAL LOW (ref 13.0–17.0)

## 2024-12-14 LAB — CBC
HCT: 20.3 % — ABNORMAL LOW (ref 39.0–52.0)
Hemoglobin: 6.4 g/dL — ABNORMAL LOW (ref 13.0–17.0)
MCH: 30.2 pg (ref 26.0–34.0)
MCHC: 31.5 g/dL (ref 30.0–36.0)
MCV: 95.8 fL (ref 80.0–100.0)
Platelets: 41 K/uL — ABNORMAL LOW (ref 150–400)
RBC: 2.12 MIL/uL — ABNORMAL LOW (ref 4.22–5.81)
RDW: 13.9 % (ref 11.5–15.5)
WBC: 5.5 K/uL (ref 4.0–10.5)
nRBC: 0 % (ref 0.0–0.2)

## 2024-12-14 LAB — PREPARE RBC (CROSSMATCH)

## 2024-12-14 MED ORDER — CHLORHEXIDINE GLUCONATE CLOTH 2 % EX PADS
6.0000 | MEDICATED_PAD | Freq: Every day | CUTANEOUS | Status: DC
  Administered 2024-12-15 – 2024-12-21 (×7): 6 via TOPICAL

## 2024-12-14 MED ORDER — SODIUM CHLORIDE 0.9% IV SOLUTION: Freq: Once | INTRAVENOUS | Status: AC

## 2024-12-14 MED ORDER — MIDODRINE HCL 5 MG PO TABS
5.0000 mg | ORAL_TABLET | Freq: Three times a day (TID) | ORAL | Status: DC
  Administered 2024-12-14 – 2024-12-15 (×4): 5 mg via ORAL
  Filled 2024-12-14 (×5): qty 1

## 2024-12-14 NOTE — Progress Notes (Signed)
"  ° °      Overnight   NAME: Mario Patrick MRN: [id number] DOB : [birth year]    Date of Service   12/14/2024   HPI/Events of Note   HPI:  Patient has a chronic ambulatory impairment using roller walker to ambulate, on the morning 01/21, he was using a vacuum to clean his floor and lost his footing with a walker and fell on his right side hip, denies any lightheadedness chest pain shortness of breath before or during the episode, no head or neck injury, no LOC.  At baseline he is not active because of the multiple OA's and uses roller walker to ambulate.  No stress test in recent years.   01/21: ED Course: Afebrile, nontachycardic blood pressure 180/80 O2 saturation 97% on room air.  Hip x-ray showed intertrochanteric fracture of right hip, blood work showed creatinine 1.7 compared to baseline 2.0-2.7 BUN 26 hemoglobin 10.7 WBC 4.5 platelet 60. Calculated revised cardiac risk for primary operation low risk is at 10% significantly increased, cardiology consulted for preop clearance. Pt to OR today, underwent intramedullary nailing of Left femur  01/22: doing well postop, pending SNF placement    Overnight: Hemoglobin 6.4 requiring 1 unit of packed red blood cells.  Patient unable to give consent however had surgery on 1/21 and gave consent and then, blood band present on patient.    Interventions/ Plan   1 unit of packed red blood cells repeat CBC posttransfusion    Updates     Laneta Gardener- Garmon BSN RN CCRN AGACNP-BC Acute Care Nurse Practitioner [facility] [place]  "

## 2024-12-14 NOTE — Progress Notes (Signed)
 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  Ongoing  GOAL:   Patient will meet greater than or equal to 90% of their needs  Progressing   MONITOR:   PO intake, Supplement acceptance  REASON FOR ASSESSMENT:   Consult Assessment of nutrition requirement/status, Hip fracture protocol  ASSESSMENT:   84 y.o. male with medical history significant of CAD status post CABG, HTN, chronic HFpEF, CKD stage IIIb, BPH, carotid stenosis, chronic thrombocytopenia, COPD, anxiety/depression, presented with mechanical fall and right hip pain.  1/21- s/p Intramedullary nailing of Left femur with cephalomedullary device   Reviewed I/O's: +1.5 L since admission  Patient sitting up in bed, with eyes closed at time of visit, but arose easily to voice and touch. No family present. Patient shares that he is not feeling well today, complains of heartburn. Noted breakfast tray in front of patient untouched. Per meal completion records, patient consumed 0% of lunch on 12/13/24 and at breakfast this morning.   Patient shares that heartburn and pain are preventing him from eating. He shares he does not regularly have heartburn and this is new to this admission. He has not had a bowel movement yet and denies any difficulty with pain and constipation PTA. Noted colace added on 12/12/24.   Patient not very talkative and did not provide diet recall. He states she was eating okay at home. He  is unsure if he has lost weight. PerCareEverywhere, weight was 57.2 kg on 08/28/24. He has experienced a 0.1% weight loss over the past 2 months, which is not significant for time frame.   Discussed importance of good meal and supplement intake to promote healing.   Medications reviewed and include colace, lovenox , and 0.9% sodium chloride  infusion @ 75 ml/hr.   Labs reviewed.    NUTRITION - FOCUSED PHYSICAL EXAM:  Flowsheet Row Most Recent Value  Orbital Region Mild depletion  Upper Arm Region Moderate depletion  Thoracic and Lumbar Region Mild depletion  Buccal Region Moderate depletion  Temple Region Mild depletion  Clavicle Bone Region Moderate depletion  Clavicle and Acromion Bone Region Moderate depletion  Scapular Bone Region Moderate depletion  Dorsal Hand Mild depletion  Patellar Region Moderate depletion  Anterior Thigh Region Moderate depletion  Posterior Calf Region Moderate depletion  Edema (RD Assessment) Mild  Hair Reviewed  Eyes Reviewed  Mouth Reviewed  Skin Reviewed  Nails Reviewed    Diet Order:   Diet Order             Diet regular Room service appropriate? Yes; Fluid consistency: Thin  Diet effective now                   EDUCATION NEEDS:   Education needs have been addressed  Skin:  Skin Assessment: Skin Integrity Issues: Skin Integrity Issues:: Incisions Incisions: closed left hip, closed left thigh  Last BM:  12/10/24  Height:   Ht Readings from Last 1 Encounters:  12/12/24 5' 7 (1.702 m)    Weight:   Wt Readings from Last  1 Encounters:  12/12/24 56.2 kg    Ideal Body Weight:  67.3 kg  BMI:  Body mass index is 19.42 kg/m.  Estimated Nutritional Needs:   Kcal:  1700-1900  Protein:  90-105 grams  Fluid:  1.7-1.9 L    Margery ORN, RD, LDN, CDCES Registered Dietitian III Certified Diabetes Care and Education Specialist If unable to reach this RD, please use RD Inpatient group chat on secure chat between hours of  8am-4 pm daily

## 2024-12-14 NOTE — Plan of Care (Signed)

## 2024-12-14 NOTE — Progress Notes (Signed)
 Occupational Therapy Treatment Patient Details Name: Mario Patrick MRN: [id number] DOB: 1941-04-17 Today's Date: 12/14/2024   History of present illness presented to ER secondary to mechanical fall with acute onset of R hip pain; admitted for management of intertrochanteric hip fracture, s/p IM nailing (1/21), WBAT   OT comments  Pt seen for OT treatment on this date. Upon arrival to room pt laying in bed, agreeable to tx session targeting improving functional activity tolerance in prep for ADL tasks. Pt needed Max a LB access at bed level. Pt requires Max A +2 for supine to sit EOB. Pt need Mod a sitting EOB with L lateral lean. Pt needed mod cueing for lateral lean when sitting EOB. Max a +2 sit to stand using RW. Pt took ~3 side steps with Max a and RW. Pt needed Max a +2 sit to supine back into bed. Pt making good progress toward goals, will continue to follow POC. Discharge recommendations updated to reflect +2 assist needs.       If plan is discharge home, recommend the following:  A lot of help with walking and/or transfers;A lot of help with bathing/dressing/bathroom;Assistance with cooking/housework;Assist for transportation;Help with stairs or ramp for entrance   Equipment Recommendations  BSC/3in1    Recommendations for Other Services      Precautions / Restrictions Precautions Precautions: Fall Recall of Precautions/Restrictions: Intact Restrictions Weight Bearing Restrictions Per Provider Order: Yes LLE Weight Bearing Per Provider Order: Weight bearing as tolerated       Mobility Bed Mobility Overal bed mobility: Needs Assistance Bed Mobility: Supine to Sit, Sit to Supine     Supine to sit: Max assist, +2 for physical assistance Sit to supine: Max assist, +2 for physical assistance        Transfers Overall transfer level: Needs assistance Equipment used: Rolling walker (2 wheels) Transfers: Sit to/from Stand Sit to Stand: Max assist, +2 physical  assistance           General transfer comment: Pt took a ~3 side steps with Max A +2 with RW.     Balance Overall balance assessment: Needs assistance Sitting-balance support: Bilateral upper extremity supported, Feet supported Sitting balance-Leahy Scale: Poor   Postural control: Left lateral lean Standing balance support: Reliant on assistive device for balance Standing balance-Leahy Scale: Poor                             ADL either performed or assessed with clinical judgement   ADL Overall ADL's : Needs assistance/impaired                                       General ADL Comments: Pt needed Max A to fix socks.    Extremity/Trunk Assessment              Occupational Psychologist Communication: Impaired Factors Affecting Communication: Hearing impaired   Cognition Arousal: Alert Behavior During Therapy: WFL for tasks assessed/performed Cognition: Cognition impaired   Orientation impairments: Time Awareness: Intellectual awareness intact       OT - Cognition Comments: delayed processing, perseverative                 Following commands: Intact        Cueing   Cueing  Techniques: Verbal cues, Gestural cues, Tactile cues  Exercises      Shoulder Instructions       General Comments      Pertinent Vitals/ Pain       Pain Assessment Pain Assessment: Faces Faces Pain Scale: Hurts whole lot Pain Location: L hip Pain Descriptors / Indicators: Aching, Grimacing, Guarding Pain Intervention(s): Monitored during session  Home Living                                          Prior Functioning/Environment              Frequency  Min 2X/week        Progress Toward Goals  OT Goals(current goals can now be found in the care plan section)  Progress towards OT goals: Progressing toward goals  Acute Rehab OT Goals Patient Stated Goal: Improve  pain OT Goal Formulation: With patient Time For Goal Achievement: 12/28/24 Potential to Achieve Goals: Good  Plan      Co-evaluation      Reason for Co-Treatment: For patient/therapist safety;To address functional/ADL transfers   OT goals addressed during session: ADL's and self-care      AM-PAC OT 6 Clicks Daily Activity     Outcome Measure   Help from another person eating meals?: None Help from another person taking care of personal grooming?: A Little Help from another person toileting, which includes using toliet, bedpan, or urinal?: A Lot Help from another person bathing (including washing, rinsing, drying)?: A Lot Help from another person to put on and taking off regular upper body clothing?: A Little Help from another person to put on and taking off regular lower body clothing?: A Lot 6 Click Score: 16    End of Session Equipment Utilized During Treatment: Rolling walker (2 wheels)  OT Visit Diagnosis: Unsteadiness on feet (R26.81);Repeated falls (R29.6);Muscle weakness (generalized) (M62.81);History of falling (Z91.81)   Activity Tolerance Patient limited by pain   Patient Left with bed alarm set;with call bell/phone within reach;in bed   Nurse Communication          Time: 0227-0254 OT Time Calculation (min): 27 min  Charges: OT General Charges $OT Visit: 1 Visit OT Treatments $Self Care/Home Management : 8-22 mins  Orianna Biskup OTS   Ronita Sauers 12/14/2024, 3:25 PM

## 2024-12-14 NOTE — Progress Notes (Signed)
 " PROGRESS NOTE    Mario Patrick   FMW:[id number] DOB: November 22, 1941  DOA: 12/12/2024 Date of Service: 12/14/24 which is hospital day 2  PCP: Zachary Idelia LABOR, MD    Mario Patrick is a 84 y.o. male with medical history significant of CAD status post CABG, HTN, chronic HFpEF, CKD stage IIIb, BPH, carotid stenosis, chronic thrombocytopenia, COPD, anxiety/depression, presented with mechanical fall and right hip pain.  HPI: Patient has a chronic ambulatory impairment using roller walker to ambulate, on the morning 01/21, he was using a vacuum to clean his floor and lost his footing with a walker and fell on his right side hip, denies any lightheadedness chest pain shortness of breath before or during the episode, no head or neck injury, no LOC.  At baseline he uses roller walker to ambulate.    Hospital course / significant events:  01/21: ED Course: Afebrile, nontachycardic blood pressure 180/80 O2 saturation 97% on room air.  Hip x-ray showed intertrochanteric fracture of right hip, blood work showed creatinine 1.7 compared to baseline 2.0-2.7 BUN 26 hemoglobin 10.7 WBC 4.5 platelet 60. Calculated revised cardiac risk for primary operation low risk is at 10% significantly increased, cardiology consulted for preop clearance. Pt to OR today, underwent intramedullary nailing of Left femur  01/22: doing well postop, pending SNF placement  01/23: Hgb 6.4 early am --> 1 unit PRBC ordered. Repeat CBC pending. BP soft, dc antihypertensive rx. Avoid opiates pending improvement in BP, consider midodrine        Consultants:  Orthopedic surgery  Cardiology   Procedures/Surgeries: 12/12/24: Intramedullary nailing of Left femur - Dr GORMAN Blanch       ASSESSMENT & PLAN:   Right intertrochanteric femoral fracture Secondary to mechanical fall S/p Intramedullary nailing of Left femur with cephalomedullary device 12/12/24 PT/OT and anticiapte to rehab  Follow-up with [facility] clinic  orthopedics in 2 weeks for staple removal and x-rays of the left hip.  No showering. DVT Prophylaxis - Lovenox  and TED hose Weight-Bearing as tolerated to left leg   ABLA perioperatively  Contributing to hypotension Transfusing 1 unit PRBC today  Monitor CBC  Hypotension Tx ABLA as above - hold lovenox  if continued low HH or overt bleed but will continue for now d/t high risk DVT postop  Hold antihypertensive meds today Consider for midodrine    History of multivessel CAD status post CABG  Chronic HFpEF HTN HLD Euvolemic Continue home atorvastatin  80 mg daily and zetia  10 mg daily.  HOLD home lisinopril  10 mg daily and metoprolol  tartrate 12.5 mg twice daily given anemia and lower BP today  Historically has not been on aspirin  due to documented allergy.    COPD Stable Nebs prn O2 prn    CKD stage IIIb Euvolemic, creatinine level stable Monitor BMP      Borderline underweight based on BMI: Body mass index is 19.42 kg/m.[name] Significantly low or high BMI is associated with higher medical risk.  Underweight - under 18  overweight - 25 to 29 obese - 30 or more Class 1 obesity: BMI of 30.0 to 34 Class 2 obesity: BMI of 35.0 to 39 Class 3 obesity: BMI of 40.0 to 49 Super Morbid Obesity: BMI 50-59 Super-super Morbid Obesity: BMI 60+ Healthy nutrition and physical activity advised as adjunct to other disease management and risk reduction treatments    DVT prophylaxis: lovenox , TED hose  IV fluids: no continuous IV fluids  Nutrition: regular diet Central lines / other devices: none  Code Status: DNR ACP  documentation reviewed:  none on file in [facility]  [facility] needs: SNf rehab placement  Medical barriers to dispo: anemia, low BP.            Subjective / Brief ROS:  Patient reports pain in side but otheriwse no concerns  Denies CP/SOB.  Denies new weakness.  Tolerating diet.  Reports no concerns w/ urination/defecation.   Family Communication: none at  bedside will update later today pending progress     Objective Findings:  Vitals:   12/14/24 0608 12/14/24 0634 12/14/24 0749 12/14/24 0920  BP: (!) 86/54 (!) 83/43 (!) 87/62 (!) 86/51  Pulse: 61 63 61 61  Resp: 16 16 16 16   Temp: 97.8 F (36.6 C) 98.1 F (36.7 C) (!) 97.3 F (36.3 C) 97.7 F (36.5 C)  TempSrc: Oral Oral Oral Oral  SpO2: 97% 100% 100% 99%  Weight:      Height:        Intake/Output Summary (Last 24 hours) at 12/14/2024 1117 Last data filed at 12/14/2024 1028 Gross per 24 hour  Intake 1727.39 ml  Output --  Net 1727.39 ml   Filed Weights   12/12/24 1317 12/12/24 1532  Weight: 56.7 kg 56.2 kg    Examination:  Physical Exam Constitutional:      General: He is not in acute distress. Cardiovascular:     Rate and Rhythm: Normal rate and regular rhythm.  Pulmonary:     Effort: Pulmonary effort is normal.     Breath sounds: Normal breath sounds.  Skin:    General: Skin is warm and dry.  Neurological:     Mental Status: He is alert.          Scheduled Medications:   acetaminophen   1,000 mg Oral Q8H   atorvastatin   80 mg Oral QHS   budesonide -glycopyrrolate -formoterol   2 puff Inhalation BID   docusate sodium   100 mg Oral BID   enoxaparin  (LOVENOX ) injection  30 mg Subcutaneous Q24H   ezetimibe   10 mg Oral Daily   feeding supplement  237 mL Oral BID BM   metoprolol  tartrate  12.5 mg Oral BID   sertraline   150 mg Oral Daily   tamsulosin   0.4 mg Oral Daily   traZODone   100 mg Oral QHS    Continuous Infusions:  sodium chloride  75 mL/hr at 12/14/24 0321    PRN Medications:  albuterol , bisacodyl , HYDROmorphone  (DILAUDID ) injection, methocarbamol  **OR** methocarbamol  (ROBAXIN ) injection, metoCLOPramide  **OR** metoCLOPramide  (REGLAN ) injection, ondansetron  **OR** ondansetron  (ZOFRAN ) IV, oxyCODONE , oxyCODONE , senna-docusate, sodium phosphate, traMADol , zolpidem   Antimicrobials from admission:  Anti-infectives (From admission, onward)    Start      Dose/Rate Route Frequency Ordered Stop   12/12/24 2200  ceFAZolin  (ANCEF ) IVPB 1 g/50 mL premix        1 g 100 mL/hr over 30 Minutes Intravenous Every 6 hours 12/12/24 2100 12/13/24 1244   12/12/24 1600  ceFAZolin  (ANCEF ) IVPB 2g/100 mL premix        2 g 200 mL/hr over 30 Minutes Intravenous On call to O.R. 12/12/24 1307 12/12/24 1657           Data Reviewed:  I have personally reviewed the following...  CBC: Recent Labs  Lab 12/12/24 1111 12/13/24 0349 12/14/24 0433  WBC 4.5 7.2 5.5  NEUTROABS 3.2  --   --   HGB 10.7* 8.5* 6.4*  HCT 33.9* 26.9* 20.3*  MCV 93.9 96.1 95.8  PLT 60* 52* 41*   Basic Metabolic Panel: Recent Labs  Lab 12/12/24  1111 12/13/24 0349 12/14/24 0433  NA 141 139 136  K 4.1 4.5 3.8  CL 107 107 104  CO2 25 24 24   GLUCOSE 119* 122* 123*  BUN 26* 29* 36*  CREATININE 1.79* 1.93* 2.50*  CALCIUM  8.8* 7.9* 7.9*   GFR: Estimated Creatinine Clearance: 18.1 mL/min (A) (by C-G formula based on SCr of 2.5 mg/dL (H)). Liver Function Tests: No results for input(s): AST, ALT, ALKPHOS, BILITOT, PROT, ALBUMIN in the last 168 hours. No results for input(s): LIPASE, AMYLASE in the last 168 hours. No results for input(s): AMMONIA in the last 168 hours. Coagulation Profile: Recent Labs  Lab 12/12/24 1111  INR 1.2   Cardiac Enzymes: No results for input(s): CKTOTAL, CKMB, CKMBINDEX, TROPONINI in the last 168 hours. BNP (last 3 results) No results for input(s): PROBNP in the last 8760 hours. HbA1C: No results for input(s): HGBA1C in the last 72 hours. CBG: No results for input(s): GLUCAP in the last 168 hours. Lipid Profile: No results for input(s): CHOL, HDL, LDLCALC, TRIG, CHOLHDL, LDLDIRECT in the last 72 hours. Thyroid  Function Tests: No results for input(s): TSH, T4TOTAL, FREET4, T3FREE, THYROIDAB in the last 72 hours. Anemia Panel: No results for input(s): VITAMINB12, FOLATE,  FERRITIN, TIBC, IRON, RETICCTPCT in the last 72 hours. Most Recent Urinalysis On File:     Component Value Date/Time   COLORURINE STRAW (A) 01/04/2024 2154   APPEARANCEUR CLEAR (A) 01/04/2024 2154   APPEARANCEUR Clear 02/01/2023 1315   LABSPEC 1.005 01/04/2024 2154   PHURINE 6.0 01/04/2024 2154   GLUCOSEU 150 (A) 01/04/2024 2154   HGBUR SMALL (A) 01/04/2024 2154   BILIRUBINUR NEGATIVE 01/04/2024 2154   BILIRUBINUR Negative 02/01/2023 1315   KETONESUR NEGATIVE 01/04/2024 2154   PROTEINUR 30 (A) 01/04/2024 2154   NITRITE NEGATIVE 01/04/2024 2154   LEUKOCYTESUR TRACE (A) 01/04/2024 2154   Sepsis Labs: @LABRCNTIP (procalcitonin:4,lacticidven:4) Microbiology: Recent Results (from the past 240 hours)  Surgical PCR screen     Status: None   Collection Time: 12/12/24  2:45 PM   Specimen: Nasal Mucosa; Nasal Swab  Result Value Ref Range Status   MRSA, PCR NEGATIVE NEGATIVE Final   Staphylococcus aureus NEGATIVE NEGATIVE Final    Comment: (NOTE) The Xpert SA Assay (FDA approved for NASAL specimens in patients 71 years of age and older), is one component of a comprehensive surveillance program. It is not intended to diagnose infection nor to guide or monitor treatment. Performed at [facility], [address] [place], [place] [address]       Radiology Studies last 3 days: DG HIP UNILAT W OR W/O PELVIS 2-3 VIEWS LEFT Result Date: 12/12/2024 CLINICAL DATA:  History of arthroplasty EXAM: DG HIP (WITH OR WITHOUT PELVIS) 2-3V LEFT COMPARISON:  12/12/2024 FINDINGS: Multiple low resolution intraoperative spot views of the left hip. Total fluoroscopy time was 33 seconds, fluoroscopic dose of 3.33 mGy. The images demonstrate intramedullary rod and screw fixation of left trochanteric fracture. IMPRESSION: Intraoperative fluoroscopic assistance provided during surgical fixation of left trochanteric fracture. Electronically Signed   By: Luke Bun M.D.   On: 12/12/2024 18:13    DG C-Arm 1-60 Min-No Report Result Date: 12/12/2024 Fluoroscopy was utilized by the requesting physician.  No radiographic interpretation.   DG C-Arm 1-60 Min-No Report Result Date: 12/12/2024 Fluoroscopy was utilized by the requesting physician.  No radiographic interpretation.   DG Chest Portable 1 View Result Date: 12/12/2024 CLINICAL DATA:  Medical clearance EXAM: PORTABLE CHEST 1 VIEW COMPARISON:  January 04, 2024. FINDINGS: Stable  cardiomegaly. Status post surgical fixation of sternum as noted on prior study. Right lung is clear. Minimal left basilar subsegmental atelectasis is noted with minimal left pleural effusion. Postsurgical changes are noted in right shoulder. IMPRESSION: Minimal left basilar subsegmental atelectasis with minimal left pleural effusion. Electronically Signed   By: Lynwood Landy Raddle M.D.   On: 12/12/2024 13:42   DG Hip Unilat W or Wo Pelvis 2-3 Views Left Result Date: 12/12/2024 CLINICAL DATA:  Status post fall. EXAM: DG HIP (WITH OR WITHOUT PELVIS) 2-3V LEFT COMPARISON:  February 14, 2021 FINDINGS: There is an acute fracture deformity extending through the inter trochanteric region of the proximal left femur. There is no evidence of dislocation. A radiopaque intramedullary rod and compression screw device are present within the proximal right femur. Soft tissue swelling is seen along the lateral aspect of the left hip. IMPRESSION: Acute intertrochanteric fracture of the proximal left femur. Electronically Signed   By: Suzen Dials M.D.   On: 12/12/2024 12:39         Laneta Blunt, DO [facility] 12/14/2024, 11:17 AM    Dictation software may have been used to generate the above note. Typos may occur and escape review in typed/dictated notes. Please contact Dr Blunt directly for clarity if needed.  Staff may message me via secure chat in Epic  but this may not receive an immediate response,  please page me for urgent matters!  If 7PM-7AM,  please contact night coverage [email or web address]       "
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
## 2024-12-14 NOTE — Progress Notes (Signed)
" °  Subjective: 2 Days Post-Op Procedures (LRB): FIXATION, FRACTURE, INTERTROCHANTERIC, WITH INTRAMEDULLARY ROD (Left) Patient reports pain as mild.   Patient is well, and has had no acute complaints or problems Plan is to go Rehab after hospital stay. Negative for chest pain and shortness of breath Fever: no Gastrointestinal: Negative for nausea and vomiting  Objective: Vital signs in last 24 hours: Temp:  [97.7 F (36.5 C)-98.2 F (36.8 C)] 98.1 F (36.7 C) (01/23 0634) Pulse Rate:  [57-67] 63 (01/23 0634) Resp:  [16-18] 16 (01/23 0634) BP: (83-120)/(43-62) 83/43 (01/23 0634) SpO2:  [93 %-100 %] 100 % (01/23 0634)  Intake/Output from previous day:  Intake/Output Summary (Last 24 hours) at 12/14/2024 0648 Last data filed at 12/14/2024 0433 Gross per 24 hour  Intake 1362.06 ml  Output --  Net 1362.06 ml    Intake/Output this shift: Total I/O In: 1362.1 [P.O.:240; I.V.:1122.1] Out: -   Labs: Recent Labs    12/12/24 1111 12/13/24 0349 12/14/24 0433  HGB 10.7* 8.5* 6.4*   Recent Labs    12/13/24 0349 12/14/24 0433  WBC 7.2 5.5  RBC 2.80* 2.12*  HCT 26.9* 20.3*  PLT 52* 41*   Recent Labs    12/13/24 0349 12/14/24 0433  NA 139 136  K 4.5 3.8  CL 107 104  CO2 24 24  BUN 29* 36*  CREATININE 1.93* 2.50*  GLUCOSE 122* 123*  CALCIUM  7.9* 7.9*   Recent Labs    12/12/24 1111  INR 1.2     EXAM General - Patient is Alert and Oriented Extremity - Neurovascular intact Sensation intact distally Dorsiflexion/Plantar flexion intact Dressing/Incision - clean, dry, no drainage Motor Function - intact, moving foot and toes well on exam.   Past Medical History:  Diagnosis Date   Anginal pain    Arthritis    Back ache    CHF (congestive heart failure) (HCC)    Chronic kidney disease    stage 3   COPD (chronic obstructive pulmonary disease) (HCC)    Coronary artery disease    recently checked carotid arteries, significant blockage   Depression    Dyspnea     Hypercholesteremia    Hypertension    Myocardial infarction (HCC)     Assessment/Plan: 2 Days Post-Op Procedures (LRB): FIXATION, FRACTURE, INTERTROCHANTERIC, WITH INTRAMEDULLARY ROD (Left) Principal Problem:   Hip fracture (HCC) Active Problems:   Hip fracture, unspecified laterality, closed, initial encounter (HCC)  Estimated body mass index is 19.42 kg/m as calculated from the following:   Height as of this encounter: 5' 7 (1.702 m).   Weight as of this encounter: 56.2 kg. Advance diet Up with therapy  Acute blood loss anemia.  Status post left hip fracture with ORIF.  Hemoglobin 6.4, with 1 unit packed red blood cells ordered in the early a.m..  Follow with internal medicine  Discharge planning: Plan to discharge to rehab.  Follow-up with [facility] clinic orthopedics in 2 weeks for staple removal and x-rays of the left hip.  No showering.  DVT Prophylaxis - Lovenox  and TED hose Weight-Bearing as tolerated to left leg  Krystal Doyne, PA-C Orthopaedic Surgery 12/14/2024, 6:48 AM  "

## 2024-12-14 NOTE — [place] FL2 (Signed)
 " [place]  MEDICAID FL2 LEVEL OF CARE FORM     IDENTIFICATION  Patient Name: Mario Patrick Birthdate: 1941/04/22 Sex: male Admission Date (Current Location): 12/12/2024  [facility] and [place] Number:  Chiropodist and Address:  [facility], [address], [place], [place] [address]      Provider Number: [id number]  Attending Physician Name and Address:  Marsa Edelman, DO  Relative Name and Phone Number:  [name] Daily [phone]    Current Level of Care: Hospital Recommended Level of Care: Skilled Nursing Facility Prior Approval Number:    Date Approved/Denied:   PASRR Number: [id number]  Discharge Plan: SNF    Current Diagnoses: Patient Active Problem List   Diagnosis Date Noted   Malnutrition of moderate degree 12/14/2024   Hip fracture, unspecified laterality, closed, initial encounter (HCC) 12/12/2024   Hip fracture (HCC) 12/12/2024   Acute on chronic congestive heart failure (HCC) 01/05/2024   Acute on chronic diastolic CHF (congestive heart failure) (HCC) 01/04/2024   COPD (chronic obstructive pulmonary disease) (HCC) 01/04/2024   CAD (coronary artery disease) 01/04/2024   Myocardial injury 01/04/2024   Chronic kidney disease, stage 3b (HCC) 01/04/2024   Type II diabetes mellitus with renal manifestations (HCC) 01/04/2024   Depression 01/04/2024   Thrombocytopenia 01/04/2024   GERD (gastroesophageal reflux disease) 01/04/2024   Carotid stenosis 02/11/2023   HTN (hypertension) 02/11/2023   Hypercholesteremia 02/11/2023   Hypotension 09/30/2020   Orthostatic hypotension 09/30/2020    Orientation RESPIRATION BLADDER Height & Weight     Self, Time, Place  O2 (2L [place]) Indwelling catheter, Continent (reviewed flowsheet, documented chronic catheter use) Weight: 56.2 kg Height:  5' 7 (170.2 cm)  BEHAVIORAL SYMPTOMS/MOOD NEUROLOGICAL BOWEL NUTRITION STATUS      Continent Diet (Diet regular Room service  appropriate? Yes; Fluid consistency: Thin 12/12/24)  AMBULATORY STATUS COMMUNICATION OF NEEDS Skin   Extensive Assist Verbally Surgical wounds (LT Hip, LT Lateral Distal Thigh / Sutures-Staples, [place]comb)                       Personal Care Assistance Level of Assistance  Bathing, Feeding, Dressing Bathing Assistance: Maximum assistance Feeding assistance: Limited assistance Dressing Assistance: Maximum assistance     Functional Limitations Info  Sight, Hearing Sight Info: Impaired (RT Eye Prosthesis, wears glasses) Hearing Info: Impaired (Hearing aids)      SPECIAL CARE FACTORS FREQUENCY  PT (By licensed PT), OT (By licensed OT)     PT Frequency: 5x week OT Frequency: 5x week            Contractures Contractures Info: Not present    Additional Factors Info  Code Status, Allergies Code Status Info: DNR Allergies Info: Contrast Media  (Iodinated Contrast Media), Aspirin            Current Medications (12/14/2024):  This is the current hospital active medication list Current Facility-Administered Medications  Medication Dose Route Frequency Provider Last Rate Last Admin   0.9 %  sodium chloride  infusion   Intravenous Continuous Tobie Priest, MD   Stopped at 12/14/24 0555   acetaminophen  (TYLENOL ) tablet 1,000 mg  1,000 mg Oral Q8H Tobie Priest, MD   1,000 mg at 12/14/24 1650   albuterol  (PROVENTIL ) (2.5 MG/3ML) 0.083% nebulizer solution 2.5 mg  2.5 mg Inhalation Q6H PRN Tobie Priest, MD       atorvastatin  (LIPITOR ) tablet 80 mg  80 mg Oral QHS Tobie Priest, MD   80 mg at 12/13/24 2008  bisacodyl  (DULCOLAX) suppository 10 mg  10 mg Rectal Daily PRN Tobie Priest, MD       budesonide -glycopyrrolate -formoterol  (BREZTRI ) 160-9-4.8 MCG/ACT inhaler 2 puff  2 puff Inhalation BID Tobie Priest, MD   2 puff at 12/14/24 0826   docusate sodium  (COLACE) capsule 100 mg  100 mg Oral BID Tobie Priest, MD   100 mg at 12/14/24 0827   enoxaparin  (LOVENOX ) injection 30 mg  30 mg  Subcutaneous Q24H Tobie Priest, MD   30 mg at 12/14/24 9173   ezetimibe  (ZETIA ) tablet 10 mg  10 mg Oral Daily Tobie Priest, MD   10 mg at 12/14/24 9170   feeding supplement (ENSURE PLUS HIGH PROTEIN) liquid 237 mL  237 mL Oral BID BM Tobie Priest, MD   237 mL at 12/13/24 0857   HYDROmorphone  (DILAUDID ) injection 0.2-0.4 mg  0.2-0.4 mg Intravenous Q4H PRN Tobie Priest, MD       methocarbamol  (ROBAXIN ) tablet 500 mg  500 mg Oral Q6H PRN Tobie Priest, MD       Or   methocarbamol  (ROBAXIN ) injection 500 mg  500 mg Intravenous Q6H PRN Tobie Priest, MD       metoCLOPramide  (REGLAN ) tablet 5-10 mg  5-10 mg Oral Q8H PRN Tobie Priest, MD       Or   metoCLOPramide  (REGLAN ) injection 5-10 mg  5-10 mg Intravenous Q8H PRN Tobie Priest, MD       metoprolol  tartrate (LOPRESSOR ) tablet 12.5 mg  12.5 mg Oral BID Alexander, Natalie, DO   12.5 mg at 12/14/24 9172   midodrine  (PROAMATINE ) tablet 5 mg  5 mg Oral TID WC Alexander, Natalie, DO   5 mg at 12/14/24 1810   ondansetron  (ZOFRAN ) tablet 4 mg  4 mg Oral Q6H PRN Tobie Priest, MD       Or   ondansetron  (ZOFRAN ) injection 4 mg  4 mg Intravenous Q6H PRN Tobie Priest, MD       oxyCODONE  (Oxy IR/ROXICODONE ) immediate release tablet 2.5-5 mg  2.5-5 mg Oral Q4H PRN Tobie Priest, MD   5 mg at 12/12/24 2244   oxyCODONE  (Oxy IR/ROXICODONE ) immediate release tablet 5-10 mg  5-10 mg Oral Q4H PRN Tobie Priest, MD   5 mg at 12/13/24 2357   senna-docusate (Senokot-S) tablet 1 tablet  1 tablet Oral QHS PRN Tobie Priest, MD   1 tablet at 12/13/24 2147   sertraline  (ZOLOFT ) tablet 150 mg  150 mg Oral Daily Tobie Priest, MD   150 mg at 12/14/24 9173   sodium phosphate (FLEET) enema 1 enema  1 enema Rectal Once PRN Tobie Priest, MD       tamsulosin  (FLOMAX ) capsule 0.4 mg  0.4 mg Oral Daily Tobie Priest, MD   0.4 mg at 12/14/24 0827   traMADol  (ULTRAM ) tablet 50 mg  50 mg Oral Q6H PRN Tobie Priest, MD       traZODone  (DESYREL ) tablet 100 mg  100 mg Oral QHS Tobie Priest, MD    100 mg at 12/13/24 2008   zolpidem  (AMBIEN ) tablet 5 mg  5 mg Oral QHS PRN Tobie Priest, MD         Discharge Medications: Please see discharge summary for a list of discharge medications.  Relevant Imaging Results:  Relevant Lab Results:   Additional Information SS# [id number]  Daved JONETTA Hamilton, RN     "

## 2024-12-14 NOTE — Progress Notes (Signed)
 " Mario Patrick       Patient ID: Mario Patrick MRN: [id number] DOB/AGE: Mar 04, 1941 84 y.o.  Admit date: 12/12/2024 Referring Physician Dr. Cort Mana Primary Physician Zachary Idelia LABOR, MD  Primary Cardiologist Dr. Cara Lovelace Reason for Consultation POC  HPI: Mario Patrick is a 84 y.o. 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  Found to have hip fracture, orthopedics is recommending surgery.  Cardiology was consulted for preoperative evaluation.   Interval history: - Patient seen and examined this morning, resting comfortably in hospital bed with no family at bedside. - Reports continued discomfort in hip. Denies any chest discomfort or shortness of breath. - Hgb down overnight, receiving transfusion this AM.  Review of systems complete and found to be negative unless listed above    Past Medical History:  Diagnosis Date   Anginal pain    Arthritis    Back ache    CHF (congestive heart failure) (HCC)    Chronic kidney disease    stage 3   COPD (chronic obstructive pulmonary disease) (HCC)    Coronary artery disease    recently checked carotid arteries, significant blockage   Depression    Dyspnea    Hypercholesteremia    Hypertension    Myocardial infarction [facility] [address])     Past Surgical History:  Procedure Laterality Date   AORTIC VALVE REPLACEMENT     BACK SURGERY     CARDIAC SURGERY     CABG X4   COLONOSCOPY     INTRAMEDULLARY (IM) NAIL INTERTROCHANTERIC Left 12/12/2024   Procedure: FIXATION, FRACTURE, INTERTROCHANTERIC, WITH INTRAMEDULLARY ROD;  Surgeon: Tobie Priest, MD;  Location: [facility] ORS;  Service: Orthopedics;  Laterality: Left;   IR KYPHO LUMBAR INC  FX REDUCE BONE BX UNI/BIL CANNULATION INC/IMAGING  09/13/2024   IR KYPHO THORACIC WITH BONE BIOPSY  09/13/2024   IR RADIOLOGIST EVAL & MGMT  09/12/2024   IR RADIOLOGIST EVAL & MGMT  10/04/2024   KYPHOPLASTY N/A 07/16/2021   Procedure: L1 KYPHOPLASTY;  Surgeon: Kathlynn Sharper, MD;  Location: [facility] ORS;  Service: Orthopedics;  Laterality: N/A;    Medications Prior to Admission  Medication Sig Dispense Refill Last Dose/Taking   acetaminophen  (TYLENOL ) 500 MG tablet Take 500 mg by mouth every 6 (six) hours as needed for moderate pain.   Unknown   albuterol  (VENTOLIN  HFA) 108 (90 Base) MCG/ACT inhaler Inhale 1-2 puffs into the lungs every 6 (six) hours as needed for wheezing or shortness of breath. (Patient taking differently: Inhale 2 puffs into the lungs every 6 (six) hours as needed for wheezing or shortness of breath.) 1 each 0 Unknown   amLODipine (NORVASC) 2.5 MG tablet Take 2.5 mg by mouth daily.   12/12/2024   atorvastatin  (LIPITOR ) 80 MG tablet Take 1 tablet by mouth at bedtime.   12/11/2024   desonide (DESOWEN) 0.05 % ointment Apply 1 Application topically 2 (two) times daily.   Taking   ezetimibe  (ZETIA ) 10 MG tablet Take 1 tablet (10 mg total) by mouth daily. 30 tablet 1 12/12/2024   Fluticasone -Salmeterol (ADVAIR) 250-50 MCG/DOSE AEPB Inhale 1 puff into the lungs 2 (two) times daily. (Patient taking differently: Inhale 1 puff into the lungs 2 (two) times daily as  needed (shortness of breath).) 60 each 0 Unknown   lisinopril  (ZESTRIL ) 10 MG tablet Take 10 mg by mouth daily.   12/12/2024   metoprolol  succinate (TOPROL -XL) 25 MG 24 hr tablet Take 12.5 mg by mouth daily.   12/12/2024   mirtazapine  (REMERON ) 7.5 MG tablet Take 7.5 mg by mouth at bedtime.   12/11/2024   nitroGLYCERIN  (NITROSTAT ) 0.4 MG SL tablet Place 0.4 mg under the tongue every 5 (five) minutes as needed for chest pain.   Unknown   nystatin (MYCOSTATIN/NYSTOP) powder Apply 1 Application topically 2 (two) times daily.   Taking    sertraline  (ZOLOFT ) 100 MG tablet Take 1 tablet (100 mg total) by mouth daily. (Patient taking differently: Take 150 mg by mouth daily.) 30 tablet 0 12/11/2024   SPIRIVA  HANDIHALER 18 MCG inhalation capsule INHALE THE CONTENTS OF 1 CAPSULE VIA INHALATION DEVICE EVERY DAY (Patient taking differently: Place 18 mcg into inhaler and inhale daily as needed (shortness of breath).) 30 capsule 0 Unknown   tamsulosin  (FLOMAX ) 0.4 MG CAPS capsule Take 1 capsule by mouth daily.   12/11/2024   traZODone  (DESYREL ) 100 MG tablet Take 100 mg by mouth at bedtime.   12/11/2024   finasteride  (PROSCAR ) 5 MG tablet Take 1 tablet (5 mg total) by mouth daily. (Patient not taking: Reported on 12/12/2024) 90 tablet 3 Not Taking   fluticasone  (FLONASE ) 50 MCG/ACT nasal spray SHAKE LIQUID AND USE 2 SPRAYS IN EACH NOSTRIL EVERY DAY (Patient not taking: Reported on 12/12/2024)   Not Taking   ipratropium-albuterol  (DUONEB) 0.5-2.5 (3) MG/3ML SOLN Take 3 mLs by nebulization every 6 (six) hours as needed. (Patient not taking: Reported on 01/04/2024) 360 mL 0    JARDIANCE 10 MG TABS tablet Take by mouth. (Patient not taking: Reported on 12/12/2024)   Not Taking   meclizine  (ANTIVERT ) 25 MG tablet Take 1 tablet (25 mg total) by mouth 3 (three) times daily as needed for dizziness. (Patient not taking: Reported on 01/04/2024) 30 tablet 0    methocarbamol  (ROBAXIN ) 500 MG tablet Take by mouth daily. As needed (Patient not taking: Reported on 12/12/2024)   Not Taking   OVER THE COUNTER MEDICATION Apply 1 application topically daily as needed (pain). Cbd cream      Social History   Socioeconomic History   Marital status: Widowed    Spouse name: Not on file   Number of children: Not on file   Years of education: Not on file   Highest education level: Not on file  Occupational History   Not on file  Tobacco Use   Smoking status: Former   Smokeless tobacco: Never  Vaping Use   Vaping status: Never Used  Substance and Sexual Activity    Alcohol use: No   Drug use: Never   Sexual activity: Not on file  Other Topics Concern   Not on file  Social History Narrative   Lives with house mate.   Social Drivers of Health   Tobacco Use: Medium Risk (12/12/2024)   Patient History    Smoking Tobacco Use: Former    Smokeless Tobacco Use: Never    Passive Exposure: Not on file  Financial Resource Strain: Low Risk  (11/07/2024)   Received from [facility]   Overall Financial Resource Strain (CARDIA)    Difficulty of Paying Living Expenses: Not hard at all  Food Insecurity: No Food Insecurity (12/12/2024)   Epic    Worried About Running Out of Food in the Last Year: Never  true    Ran Out of Food in the Last Year: Never true  Transportation Needs: No Transportation Needs (12/12/2024)   Epic    Lack of Transportation (Medical): No    Lack of Transportation (Non-Medical): No  Physical Activity: Not on file  Stress: Not on file  Social Connections: Moderately Integrated (12/13/2024)   Social Connection and Isolation Panel    Frequency of Communication with Friends and Family: Three times a week    Frequency of Social Gatherings with Friends and Family: Three times a week    Attends Religious Services: More than 4 times per year    Active Member of Clubs or Organizations: Yes    Attends Banker Meetings: More than 4 times per year    Marital Status: Widowed  Intimate Partner Violence: Not At Risk (12/12/2024)   Epic    Fear of Current or Ex-Partner: No    Emotionally Abused: No    Physically Abused: No    Sexually Abused: No  Depression (PHQ2-9): Not on file  Alcohol Screen: Not on file  Housing: Low Risk (12/12/2024)   Epic    Unable to Pay for Housing in the Last Year: No    Number of Times Moved in the Last Year: 0    Homeless in the Last Year: No  Utilities: Not At Risk (12/12/2024)   Epic    Threatened with loss of utilities: No  Health Literacy: Not on file    Family History  Problem  Relation Age of Onset   Lung disease Mother    Cancer Father      Vitals:   12/14/24 0501 12/14/24 0608 12/14/24 0634 12/14/24 0749  BP: (!) 100/47 (!) 86/54 (!) 83/43 (!) 87/62  Pulse: 61 61 63 61  Resp: 16 16 16 16   Temp: 98.2 F (36.8 C) 97.8 F (36.6 C) 98.1 F (36.7 C) (!) 97.3 F (36.3 C)  TempSrc: Oral Oral Oral Oral  SpO2: 93% 97% 100% 100%  Weight:      Height:        PHYSICAL EXAM General: Chronically ill appearing elderly male, well nourished, in no acute distress. HEENT: Normocephalic and atraumatic. Neck: No JVD.  Lungs: Normal respiratory effort on room air. Clear bilaterally to auscultation. No wheezes, crackles, rhonchi.  Heart: HRRR. Normal S1 and S2 without gallops or murmurs.  Abdomen: Non-distended appearing.  Msk: Normal strength and tone for age. Extremities: Warm and well perfused. No clubbing, cyanosis. No edema.  Neuro: Alert and oriented X 3. Psych: Answers questions appropriately.   Labs: Basic Metabolic Panel: Recent Labs    12/13/24 0349 12/14/24 0433  NA 139 136  K 4.5 3.8  CL 107 104  CO2 24 24  GLUCOSE 122* 123*  BUN 29* 36*  CREATININE 1.93* 2.50*  CALCIUM  7.9* 7.9*   Liver Function Tests: No results for input(s): AST, ALT, ALKPHOS, BILITOT, PROT, ALBUMIN in the last 72 hours. No results for input(s): LIPASE, AMYLASE in the last 72 hours. CBC: Recent Labs    12/12/24 1111 12/13/24 0349 12/14/24 0433  WBC 4.5 7.2 5.5  NEUTROABS 3.2  --   --   HGB 10.7* 8.5* 6.4*  HCT 33.9* 26.9* 20.3*  MCV 93.9 96.1 95.8  PLT 60* 52* 41*   Cardiac Enzymes: No results for input(s): CKTOTAL, CKMB, CKMBINDEX, TROPONINIHS in the last 72 hours. BNP: No results for input(s): BNP in the last 72 hours. D-Dimer: No results for input(s): DDIMER in the last 72  hours. Hemoglobin A1C: No results for input(s): HGBA1C in the last 72 hours. Fasting Lipid Panel: No results for input(s): CHOL, HDL, LDLCALC,  TRIG, CHOLHDL, LDLDIRECT in the last 72 hours. Thyroid  Function Tests: No results for input(s): TSH, T4TOTAL, T3FREE, THYROIDAB in the last 72 hours.  Invalid input(s): FREET3 Anemia Panel: No results for input(s): VITAMINB12, FOLATE, FERRITIN, TIBC, IRON, RETICCTPCT in the last 72 hours.   Radiology: DG HIP UNILAT W OR W/O PELVIS 2-3 VIEWS LEFT Result Date: 12/12/2024 CLINICAL DATA:  History of arthroplasty EXAM: DG HIP (WITH OR WITHOUT PELVIS) 2-3V LEFT COMPARISON:  12/12/2024 FINDINGS: Multiple low resolution intraoperative spot views of the left hip. Total fluoroscopy time was 33 seconds, fluoroscopic dose of 3.33 mGy. The images demonstrate intramedullary rod and screw fixation of left trochanteric fracture. IMPRESSION: Intraoperative fluoroscopic assistance provided during surgical fixation of left trochanteric fracture. Electronically Signed   By: Luke Bun M.D.   On: 12/12/2024 18:13   DG C-Arm 1-60 Min-No Report Result Date: 12/12/2024 Fluoroscopy was utilized by the requesting physician.  No radiographic interpretation.   DG C-Arm 1-60 Min-No Report Result Date: 12/12/2024 Fluoroscopy was utilized by the requesting physician.  No radiographic interpretation.   DG Chest Portable 1 View Result Date: 12/12/2024 CLINICAL DATA:  Medical clearance EXAM: PORTABLE CHEST 1 VIEW COMPARISON:  January 04, 2024. FINDINGS: Stable cardiomegaly. Status post surgical fixation of sternum as noted on prior study. Right lung is clear. Minimal left basilar subsegmental atelectasis is noted with minimal left pleural effusion. Postsurgical changes are noted in right shoulder. IMPRESSION: Minimal left basilar subsegmental atelectasis with minimal left pleural effusion. Electronically Signed   By: Lynwood Landy Raddle M.D.   On: 12/12/2024 13:42   DG Hip Unilat W or Wo Pelvis 2-3 Views Left Result Date: 12/12/2024 CLINICAL DATA:  Status post fall. EXAM: DG HIP (WITH OR WITHOUT  PELVIS) 2-3V LEFT COMPARISON:  February 14, 2021 FINDINGS: There is an acute fracture deformity extending through the inter trochanteric region of the proximal left femur. There is no evidence of dislocation. A radiopaque intramedullary rod and compression screw device are present within the proximal right femur. Soft tissue swelling is seen along the lateral aspect of the left hip. IMPRESSION: Acute intertrochanteric fracture of the proximal left femur. Electronically Signed   By: Suzen Dials M.D.   On: 12/12/2024 12:39    ECHO 12/2023: 1. Left ventricular ejection fraction, by estimation, is 60 to 65%. The left ventricle has normal function. The left ventricle has no regional wall motion abnormalities. There is moderate left ventricular hypertrophy. Left ventricular diastolic parameters are consistent with Grade I diastolic dysfunction (impaired relaxation).   2. Right ventricular systolic function is normal. The right ventricular size is normal. Tricuspid regurgitation signal is inadequate for assessing PA pressure.   3. Left atrial size was mild to moderately dilated.   4. The mitral valve is normal in structure. Mild mitral valve  regurgitation. No evidence of mitral stenosis.   5. The aortic valve is normal in structure. There is mild calcification of the aortic valve. Aortic valve regurgitation is not visualized. Aortic valve sclerosis/calcification is present, without any evidence of aortic stenosis.   6. The inferior vena cava is normal in size with greater than 50%  respiratory variability, suggesting right atrial pressure of 3 mmHg.   TELEMETRY (personally reviewed): not on tele  EKG (personally reviewed): NSR, ST-T changes in lateral leads which appear similar to prior  Data reviewed by me 12/14/2024: last 24h  vitals tele labs imaging I/O ED provider Patrick, admission H&P, hospitalist progress Patrick, Ortho notes  Principal Problem:   Hip fracture (HCC) Active Problems:   Hip fracture,  unspecified laterality, closed, initial encounter (HCC)    ASSESSMENT AND PLAN:  Riggin Cuttino Ballo is a 84 y.o. 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  Found to have hip fracture, orthopedics is recommending surgery.  Cardiology was consulted for preoperative evaluation.   # Preoperative cardiac evaluation # Fall with L hip fx # Coronary artery disease s/p CABG # Chronic HFpEF # Hypertension # Hyperlipidemia # COPD # CKD Patient presented after fall this AM found to have fracture of L proximal femur. Ortho suggests surgery for repair. No complaints of CP, SOB, exertional symptoms recently. Last echo 2/25 with preserved EF and no WMAs. LHC 2022 with patent grafts. EKG today demonstrates NSR with ST-T wave changes similar to prior.  S/p intramedullary nailing of left femur 12/12/2024. - Continue home atorvastatin  80 mg daily and zetia  10 mg daily.  - Continue home lisinopril  10 mg daily and metoprolol  tartrate 12.5 mg twice daily.  - Historically has not been on aspirin  due to documented allergy.   This patient's plan of care was discussed and created with Dr. Florencio and he is in agreement.  Signed: Danita Bloch, PA-C  12/14/2024, 9:11 AM [facility]      "

## 2024-12-14 NOTE — Progress Notes (Signed)
 Physical Therapy Treatment Patient Details Name: Mario Patrick MRN: [id number] DOB: [birth year] Today's Date: 12/14/2024   History of Present Illness presented to ER secondary to mechanical fall with acute onset of R hip pain; admitted for management of intertrochanteric hip fracture, s/p IM nailing (1/21), WBAT    PT Comments  Patient supine in bed on arrival and agreeable to PT treatment session. Required maxA+2 for bed mobility and up to maxA to maintain sitting balance 2/2 posterior bias. Stood from EOB with maxA+2 to RW x 2. On second standing attempt, patient taking sidesteps towards [facility] with maxA+2 and RW. Patient with delayed processing and intermittent disorientation of place. Discharge plan remains appropriate.     If plan is discharge home, recommend the following: Two people to help with walking and/or transfers;Two people to help with bathing/dressing/bathroom   Can travel by private vehicle        Equipment Recommendations  Other (comment) (TBD)    Recommendations for Other Services       Precautions / Restrictions Precautions Precautions: Fall Recall of Precautions/Restrictions: Intact Restrictions Weight Bearing Restrictions Per Provider Order: Yes RLE Weight Bearing Per Provider Order: Weight bearing as tolerated LLE Weight Bearing Per Provider Order: Weight bearing as tolerated     Mobility  Bed Mobility Overal bed mobility: Needs Assistance Bed Mobility: Supine to Sit, Sit to Supine     Supine to sit: Max assist, +2 for physical assistance Sit to supine: Max assist, +2 for physical assistance        Transfers Overall transfer level: Needs assistance Equipment used: Rolling Chantel Teti (2 wheels) Transfers: Sit to/from Stand Sit to Stand: Max assist, +2 physical assistance           General transfer comment: Pt took a ~3 side steps with Max A +2 with RW.    Ambulation/Gait                   Stairs             Wheelchair  Mobility     Tilt Bed    Modified Rankin (Stroke Patients Only)       Balance Overall balance assessment: Needs assistance Sitting-balance support: Bilateral upper extremity supported, Feet supported Sitting balance-Leahy Scale: Poor   Postural control: Left lateral lean Standing balance support: Reliant on assistive device for balance Standing balance-Leahy Scale: Poor                              Communication Communication Communication: Impaired Factors Affecting Communication: Hearing impaired  Cognition Arousal: Alert Behavior During Therapy: WFL for tasks assessed/performed   PT - Cognitive impairments: No family/caregiver present to determine baseline                       PT - Cognition Comments: oriented to self, situation, and time, however intermittently confused about place. Delayed processing Following commands: Intact      Cueing Cueing Techniques: Verbal cues, Gestural cues, Tactile cues  Exercises      General Comments        Pertinent Vitals/Pain Pain Assessment Pain Assessment: Faces Faces Pain Scale: Hurts whole lot Pain Location: L hip Pain Descriptors / Indicators: Aching, Grimacing, Guarding Pain Intervention(s): Monitored during session, Limited activity within patient's tolerance, Repositioned    Home Living  Prior Function            PT Goals (current goals can now be found in the care plan section) Acute Rehab PT Goals PT Goal Formulation: With patient Time For Goal Achievement: 12/27/24 Potential to Achieve Goals: Good Progress towards PT goals: Progressing toward goals    Frequency    Min 3X/week      PT Plan      Co-evaluation PT/OT/SLP Co-Evaluation/Treatment: Yes Reason for Co-Treatment: For patient/therapist safety;To address functional/ADL transfers PT goals addressed during session: Mobility/safety with mobility OT goals addressed during session:  ADL's and self-care      AM-PAC PT 6 Clicks Mobility   Outcome Measure  Help needed turning from your back to your side while in a flat bed without using bedrails?: Total Help needed moving from lying on your back to sitting on the side of a flat bed without using bedrails?: Total Help needed moving to and from a bed to a chair (including a wheelchair)?: Total Help needed standing up from a chair using your arms (e.g., wheelchair or bedside chair)?: Total Help needed to walk in hospital room?: Total Help needed climbing 3-5 steps with a railing? : Total 6 Click Score: 6    End of Session   Activity Tolerance: Patient limited by pain Patient left: in bed;with call bell/phone within reach;with bed alarm set Nurse Communication: Mobility status PT Visit Diagnosis: Muscle weakness (generalized) (M62.81);Difficulty in walking, not elsewhere classified (R26.2);Pain Pain - Right/Left: Left Pain - part of body: Hip     Time: 8572-8545 PT Time Calculation (min) (ACUTE ONLY): 27 min  Charges:    $Therapeutic Activity: 8-22 mins PT General Charges $$ ACUTE PT VISIT: 1 Visit                     Maryanne Finder, PT, DPT Physical Therapist - [facility]  [facility]   Zlata Alcaide A Habiba Treloar 12/14/2024, 3:58 PM

## 2024-12-15 DIAGNOSIS — S72001A Fracture of unspecified part of neck of right femur, initial encounter for closed fracture: Secondary | ICD-10-CM | POA: Diagnosis not present

## 2024-12-15 LAB — TYPE AND SCREEN
ABO/RH(D): O POS
Antibody Screen: NEGATIVE
Unit division: 0

## 2024-12-15 LAB — BPAM RBC
Blood Product Expiration Date: 202602202359
ISSUE DATE / TIME: 202601230614
Unit Type and Rh: 5100

## 2024-12-15 LAB — BASIC METABOLIC PANEL WITH GFR
Anion gap: 11 (ref 5–15)
BUN: 38 mg/dL — ABNORMAL HIGH (ref 8–23)
CO2: 21 mmol/L — ABNORMAL LOW (ref 22–32)
Calcium: 8.3 mg/dL — ABNORMAL LOW (ref 8.9–10.3)
Chloride: 106 mmol/L (ref 98–111)
Creatinine, Ser: 2.61 mg/dL — ABNORMAL HIGH (ref 0.61–1.24)
GFR, Estimated: 24 mL/min — ABNORMAL LOW
Glucose, Bld: 116 mg/dL — ABNORMAL HIGH (ref 70–99)
Potassium: 3.8 mmol/L (ref 3.5–5.1)
Sodium: 137 mmol/L (ref 135–145)

## 2024-12-15 LAB — CBC
HCT: 24.8 % — ABNORMAL LOW (ref 39.0–52.0)
Hemoglobin: 8 g/dL — ABNORMAL LOW (ref 13.0–17.0)
MCH: 29 pg (ref 26.0–34.0)
MCHC: 32.3 g/dL (ref 30.0–36.0)
MCV: 89.9 fL (ref 80.0–100.0)
Platelets: 39 10*3/uL — ABNORMAL LOW (ref 150–400)
RBC: 2.76 MIL/uL — ABNORMAL LOW (ref 4.22–5.81)
RDW: 15.7 % — ABNORMAL HIGH (ref 11.5–15.5)
WBC: 7.6 10*3/uL (ref 4.0–10.5)
nRBC: 0 % (ref 0.0–0.2)

## 2024-12-15 MED ORDER — LACTATED RINGERS IV SOLN: INTRAVENOUS | Status: AC

## 2024-12-15 NOTE — Plan of Care (Signed)
   Problem: Nutrition: Goal: Adequate nutrition will be maintained Outcome: Not Progressing

## 2024-12-15 NOTE — Progress Notes (Signed)
 Patient ID: Mario Patrick, male   DOB: 28-Jul-1941, 84 y.o.   MRN: [id number] [facility]    SUBJECTIVE: Patient still having some pain in his hip no shortness of breath no chest pain resting comfortably fairly anemic but states to be doing reasonably well   Vitals:   12/14/24 1621 12/14/24 2017 12/15/24 0402 12/15/24 0753  BP: (!) 86/62 119/63 110/65 110/64  Pulse: 70 76 77 68  Resp: 17 18 18 18   Temp: 98.3 F (36.8 C) (!) 97.4 F (36.3 C) 97.6 F (36.4 C) 97.7 F (36.5 C)  TempSrc:    Oral  SpO2: 96% 100% 100% 98%  Weight:      Height:        No intake or output data in the 24 hours ending 12/15/24 1301    PHYSICAL EXAM  General: Well developed, well nourished, in no acute distress HEENT:  Normocephalic and atramatic Neck:  No JVD.  Lungs: Clear bilaterally to auscultation and percussion. Heart: HRRR . Normal S1 and S2 without gallops or murmurs.  Abdomen: Bowel sounds are positive, abdomen soft and non-tender  Msk:  Back normal, normal gait. Normal strength and tone for age. Extremities: No clubbing, cyanosis or edema.   Neuro: Alert and oriented X 3. Psych:  Good affect, responds appropriately   LABS: Basic Metabolic Panel: Recent Labs    12/14/24 0433 12/15/24 0555  NA 136 137  K 3.8 3.8  CL 104 106  CO2 24 21*  GLUCOSE 123* 116*  BUN 36* 38*  CREATININE 2.50* 2.61*  CALCIUM  7.9* 8.3*   Liver Function Tests: No results for input(s): AST, ALT, ALKPHOS, BILITOT, PROT, ALBUMIN in the last 72 hours. No results for input(s): LIPASE, AMYLASE in the last 72 hours. CBC: Recent Labs    12/14/24 0433 12/14/24 1139 12/15/24 0555  WBC 5.5  --  7.6  HGB 6.4* 8.9* 8.0*  HCT 20.3* 27.3* 24.8*  MCV 95.8  --  89.9  PLT 41*  --  39*   Cardiac Enzymes: No results for input(s): CKTOTAL, CKMB, CKMBINDEX, TROPONINI in the last 72 hours. BNP: Invalid input(s): POCBNP D-Dimer: No results for input(s): DDIMER in the last 72  hours. Hemoglobin A1C: No results for input(s): HGBA1C in the last 72 hours. Fasting Lipid Panel: No results for input(s): CHOL, HDL, LDLCALC, TRIG, CHOLHDL, LDLDIRECT in the last 72 hours. Thyroid  Function Tests: No results for input(s): TSH, T4TOTAL, T3FREE, THYROIDAB in the last 72 hours.  Invalid input(s): FREET3 Anemia Panel: No results for input(s): VITAMINB12, FOLATE, FERRITIN, TIBC, IRON, RETICCTPCT in the last 72 hours.  No results found.   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  Cara JONETTA Lovelace, MD, 12/15/2024 1:01 PM

## 2024-12-15 NOTE — Progress Notes (Signed)
 Physical Therapy Treatment Patient Details Name: Mario Patrick MRN: [id number] DOB: 1941-01-30 Today's Date: 12/15/2024   History of Present Illness presented to ER secondary to mechanical fall with acute onset of R hip pain; admitted for management of intertrochanteric hip fracture, s/p IM nailing (1/21), WBAT    PT Comments  Pt ready for session.  Participated in exercises as described below.  Pt is assisted to EOB with mod a x 2.  Pt able to hold balance sitting initially but within a minute or so increases L lean and needs verbal and physical assist to remain upright.  He is able to stand to [facility] with mod a x 2 and overall tolerates transfer to chair well.  He does keep L lean in lift and needs +2 for general safety.  Pt with old R shoulder injury which does limit his ability to assist with R arm especially in over 90 degree flexion positions.  Remained in chair with safety on and needs met.     If plan is discharge home, recommend the following: Two people to help with walking and/or transfers;Two people to help with bathing/dressing/bathroom;Assistance with cooking/housework;Assist for transportation;Help with stairs or ramp for entrance   Can travel by private vehicle        Equipment Recommendations       Recommendations for Other Services       Precautions / Restrictions Precautions Precautions: Fall Recall of Precautions/Restrictions: Intact Restrictions Weight Bearing Restrictions Per Provider Order: Yes RLE Weight Bearing Per Provider Order: Weight bearing as tolerated LLE Weight Bearing Per Provider Order: Weight bearing as tolerated     Mobility  Bed Mobility Overal bed mobility: Needs Assistance Bed Mobility: Supine to Sit     Supine to sit: Mod assist, +2 for physical assistance       Patient Response: Cooperative  Transfers Overall transfer level: Needs assistance   Transfers: Bed to chair/wheelchair/BSC Sit to Stand: Via lift equipment            General transfer comment: +2 for Stedy lift.  +2 to stand and safely use lift due to some L lean in lift but overall does and tolerates well. Transfer via Lift Equipment: Stedy  Ambulation/Gait                   Stairs             Wheelchair Mobility     Tilt Bed Tilt Bed Patient Response: Cooperative  Modified Rankin (Stroke Patients Only)       Balance Overall balance assessment: Needs assistance Sitting-balance support: Bilateral upper extremity supported, Feet supported Sitting balance-Leahy Scale: Poor Sitting balance - Comments: +1 hands on in sitting. Postural control: Left lateral lean Standing balance support: Reliant on assistive device for balance Standing balance-Leahy Scale: Poor                              Communication Communication Communication: Impaired Factors Affecting Communication: Hearing impaired  Cognition Arousal: Alert Behavior During Therapy: WFL for tasks assessed/performed   PT - Cognitive impairments: No apparent impairments                       PT - Cognition Comments: seemed orientated and appropraite for session Following commands: Intact      Cueing Cueing Techniques: Verbal cues, Gestural cues, Tactile cues  Exercises Other Exercises Other Exercises: supine AAROM LLE prior to mobility  x 10    General Comments        Pertinent Vitals/Pain Pain Assessment Pain Assessment: Faces Faces Pain Scale: Hurts even more Pain Location: L hip Pain Descriptors / Indicators: Aching, Grimacing, Guarding Pain Intervention(s): Limited activity within patient's tolerance, Monitored during session, Repositioned    Home Living                          Prior Function            PT Goals (current goals can now be found in the care plan section) Progress towards PT goals: Progressing toward goals    Frequency    Min 3X/week      PT Plan      Co-evaluation               AM-PAC PT 6 Clicks Mobility   Outcome Measure  Help needed turning from your back to your side while in a flat bed without using bedrails?: A Lot Help needed moving from lying on your back to sitting on the side of a flat bed without using bedrails?: A Lot Help needed moving to and from a bed to a chair (including a wheelchair)?: Total Help needed standing up from a chair using your arms (e.g., wheelchair or bedside chair)?: Total Help needed to walk in hospital room?: Total Help needed climbing 3-5 steps with a railing? : Total 6 Click Score: 8    End of Session Equipment Utilized During Treatment: Gait belt Activity Tolerance: Patient tolerated treatment well Patient left: in chair;with call bell/phone within reach;with chair alarm set;with nursing/sitter in room Nurse Communication: Mobility status PT Visit Diagnosis: Muscle weakness (generalized) (M62.81);Difficulty in walking, not elsewhere classified (R26.2);Pain Pain - Right/Left: Left Pain - part of body: Hip     Time: 9242-9183 PT Time Calculation (min) (ACUTE ONLY): 19 min  Charges:    $Therapeutic Exercise: 8-22 mins PT General Charges $$ ACUTE PT VISIT: 1 Visit                   Lauraine Gills, PTA 12/15/24, 8:36 AM

## 2024-12-15 NOTE — Plan of Care (Signed)

## 2024-12-15 NOTE — Progress Notes (Signed)
 " PROGRESS NOTE    Mario Patrick   FMW:[id number] DOB: 17-Aug-1941  DOA: 12/12/2024 Date of Service: 12/15/24 which is hospital day 3  PCP: Zachary Idelia LABOR, MD    Mario Patrick is a 84 y.o. male with medical history significant of CAD status post CABG, HTN, chronic HFpEF, CKD stage IIIb, BPH, carotid stenosis, chronic thrombocytopenia, COPD, anxiety/depression, presented with mechanical fall and right hip pain.  HPI: Patient has a chronic ambulatory impairment using roller walker to ambulate, on the morning 01/21, he was using a vacuum to clean his floor and lost his footing with a walker and fell on his right side hip, denies any lightheadedness chest pain shortness of breath before or during the episode, no head or neck injury, no LOC.  At baseline he uses roller walker to ambulate.    Hospital course / significant events:  01/21: ED Course: Afebrile, nontachycardic blood pressure 180/80 O2 saturation 97% on room air.  Hip x-ray showed intertrochanteric fracture of right hip, blood work showed creatinine 1.7 compared to baseline 2.0-2.7 BUN 26 hemoglobin 10.7 WBC 4.5 platelet 60. Calculated revised cardiac risk for primary operation low risk is at 10% significantly increased, cardiology consulted for preop clearance. Pt to OR today, underwent intramedullary nailing of Left femur  01/22: doing well postop, pending SNF placement  01/23: Hgb 6.4 early am --> 1 unit PRBC ordered. Repeat CBC pending. BP soft, systolic 80s. Avoid opiates pending improvement in BP, added midodrine  and hold lisinopril  (kept beta blocker low dose d/t afib hx) 01/24: BP improved systolic 110s. Hgb holding at 8.0. Cr 2.6, added 1L LR infuse slowly today.       Consultants:  Orthopedic surgery  Cardiology   Procedures/Surgeries: 12/12/24: Intramedullary nailing of Left femur - Dr GORMAN Blanch       ASSESSMENT & PLAN:   Right intertrochanteric femoral fracture Secondary to mechanical fall S/p  Intramedullary nailing of Left femur with cephalomedullary device 12/12/24 PT/OT and anticiapte to rehab  Follow-up with [facility] orthopedics in 2 weeks for staple removal and x-rays of the left hip.  No showering. DVT Prophylaxis - Lovenox  and TED hose Weight-Bearing as tolerated to left leg   ABLA perioperatively  Contributing to hypotension Transfusing 1 unit PRBC 01/23 --> improved   Monitor CBC  Hypotension Tx ABLA as above - hold lovenox  if continued low HH or overt bleed but will continue for now d/t high risk DVT postop  Hold antihypertensive meds (keep beta blocker for rate control, low dose) Consider for midodrine    History of multivessel CAD status post CABG  Chronic HFpEF HTN HLD Euvolemic Continue home atorvastatin  80 mg daily and zetia  10 mg daily.  HOLD home lisinopril  10 mg daily given anemia and lower BP Continue metoprolol  tartrate 12.5 mg twice daily for Afib, added midodrine  and BP has improved   has not been on aspirin  due to allergy.    COPD Stable Nebs prn O2 prn    AKI on CKD stage IIIb Euvolemic, creatinine level stable but now up some today Optimized renal perfusion w/ increasing BP, unit PRBC yesterday but Cr still up-trending  IV fluids today x1L Holding lisinopril  Monitor BMP      Borderline underweight based on BMI: Body mass index is 19.42 kg/m.[name] Significantly low or high BMI is associated with higher medical risk.  Underweight - under 18  overweight - 25 to 29 obese - 30 or more Class 1 obesity: BMI of 30.0 to 34 Class 2  obesity: BMI of 35.0 to 39 Class 3 obesity: BMI of 40.0 to 49 Super Morbid Obesity: BMI 50-59 Super-super Morbid Obesity: BMI 60+ Healthy nutrition and physical activity advised as adjunct to other disease management and risk reduction treatments    DVT prophylaxis: lovenox , TED hose  IV fluids: LE continuous IV fluids today for 1L Nutrition: regular diet Central lines / other devices: none  Code  Status: DNR ACP documentation reviewed:  none on file in [facility]  TOC needs: SNf rehab placement  Medical barriers to dispo: anemia, low BP.            Subjective / Brief ROS:  Patient reports pain a bit better today Feels more energetic compared to yesterday  Denies CP/SOB.  Denies new weakness.  Tolerating diet.  Reports no concerns w/ urination/defecation.   Family Communication: call to update daughter Mario Patrick 12/15/24 1:21 PM left HIPAA compliant voicemail     Objective Findings:  Vitals:   12/14/24 1621 12/14/24 2017 12/15/24 0402 12/15/24 0753  BP: (!) 86/62 119/63 110/65 110/64  Pulse: 70 76 77 68  Resp: 17 18 18 18   Temp: 98.3 F (36.8 C) (!) 97.4 F (36.3 C) 97.6 F (36.4 C) 97.7 F (36.5 C)  TempSrc:    Oral  SpO2: 96% 100% 100% 98%  Weight:      Height:       No intake or output data in the 24 hours ending 12/15/24 1318  Filed Weights   12/12/24 1317 12/12/24 1532  Weight: 56.7 kg 56.2 kg    Examination:  Physical Exam Constitutional:      General: He is not in acute distress. Cardiovascular:     Rate and Rhythm: Normal rate and regular rhythm.  Pulmonary:     Effort: Pulmonary effort is normal.     Breath sounds: Normal breath sounds.  Skin:    General: Skin is warm and dry.  Neurological:     Mental Status: He is alert.          Scheduled Medications:   acetaminophen   1,000 mg Oral Q8H   atorvastatin   80 mg Oral QHS   budesonide -glycopyrrolate -formoterol   2 puff Inhalation BID   Chlorhexidine  Gluconate Cloth  6 each Topical Daily   docusate sodium   100 mg Oral BID   enoxaparin  (LOVENOX ) injection  30 mg Subcutaneous Q24H   ezetimibe   10 mg Oral Daily   feeding supplement  237 mL Oral BID BM   metoprolol  tartrate  12.5 mg Oral BID   midodrine   5 mg Oral TID WC   sertraline   150 mg Oral Daily   tamsulosin   0.4 mg Oral Daily   traZODone   100 mg Oral QHS    Continuous Infusions:  lactated ringers  100 mL/hr at 12/15/24  0923    PRN Medications:  albuterol , bisacodyl , HYDROmorphone  (DILAUDID ) injection, methocarbamol  **OR** methocarbamol  (ROBAXIN ) injection, metoCLOPramide  **OR** metoCLOPramide  (REGLAN ) injection, ondansetron  **OR** ondansetron  (ZOFRAN ) IV, oxyCODONE , oxyCODONE , senna-docusate, sodium phosphate, traMADol , zolpidem   Antimicrobials from admission:  Anti-infectives (From admission, onward)    Start     Dose/Rate Route Frequency Ordered Stop   12/12/24 2200  ceFAZolin  (ANCEF ) IVPB 1 g/50 mL premix        1 g 100 mL/hr over 30 Minutes Intravenous Every 6 hours 12/12/24 2100 12/13/24 1244   12/12/24 1600  ceFAZolin  (ANCEF ) IVPB 2g/100 mL premix        2 g 200 mL/hr over 30 Minutes Intravenous On call to O.R. 12/12/24 1307 12/12/24  1657           Data Reviewed:  I have personally reviewed the following...  CBC: Recent Labs  Lab 12/12/24 1111 12/13/24 0349 12/14/24 0433 12/14/24 1139 12/15/24 0555  WBC 4.5 7.2 5.5  --  7.6  NEUTROABS 3.2  --   --   --   --   HGB 10.7* 8.5* 6.4* 8.9* 8.0*  HCT 33.9* 26.9* 20.3* 27.3* 24.8*  MCV 93.9 96.1 95.8  --  89.9  PLT 60* 52* 41*  --  39*   Basic Metabolic Panel: Recent Labs  Lab 12/12/24 1111 12/13/24 0349 12/14/24 0433 12/15/24 0555  NA 141 139 136 137  K 4.1 4.5 3.8 3.8  CL 107 107 104 106  CO2 25 24 24  21*  GLUCOSE 119* 122* 123* 116*  BUN 26* 29* 36* 38*  CREATININE 1.79* 1.93* 2.50* 2.61*  CALCIUM  8.8* 7.9* 7.9* 8.3*   GFR: Estimated Creatinine Clearance: 17.3 mL/min (A) (by C-G formula based on SCr of 2.61 mg/dL (H)). Liver Function Tests: No results for input(s): AST, ALT, ALKPHOS, BILITOT, PROT, ALBUMIN in the last 168 hours. No results for input(s): LIPASE, AMYLASE in the last 168 hours. No results for input(s): AMMONIA in the last 168 hours. Coagulation Profile: Recent Labs  Lab 12/12/24 1111  INR 1.2   Cardiac Enzymes: No results for input(s): CKTOTAL, CKMB, CKMBINDEX, TROPONINI  in the last 168 hours. BNP (last 3 results) No results for input(s): PROBNP in the last 8760 hours. HbA1C: No results for input(s): HGBA1C in the last 72 hours. CBG: No results for input(s): GLUCAP in the last 168 hours. Lipid Profile: No results for input(s): CHOL, HDL, LDLCALC, TRIG, CHOLHDL, LDLDIRECT in the last 72 hours. Thyroid  Function Tests: No results for input(s): TSH, T4TOTAL, FREET4, T3FREE, THYROIDAB in the last 72 hours. Anemia Panel: No results for input(s): VITAMINB12, FOLATE, FERRITIN, TIBC, IRON, RETICCTPCT in the last 72 hours. Most Recent Urinalysis On File:     Component Value Date/Time   COLORURINE STRAW (A) 01/04/2024 2154   APPEARANCEUR CLEAR (A) 01/04/2024 2154   APPEARANCEUR Clear 02/01/2023 1315   LABSPEC 1.005 01/04/2024 2154   PHURINE 6.0 01/04/2024 2154   GLUCOSEU 150 (A) 01/04/2024 2154   HGBUR SMALL (A) 01/04/2024 2154   BILIRUBINUR NEGATIVE 01/04/2024 2154   BILIRUBINUR Negative 02/01/2023 1315   KETONESUR NEGATIVE 01/04/2024 2154   PROTEINUR 30 (A) 01/04/2024 2154   NITRITE NEGATIVE 01/04/2024 2154   LEUKOCYTESUR TRACE (A) 01/04/2024 2154   Sepsis Labs: @LABRCNTIP (procalcitonin:4,lacticidven:4) Microbiology: Recent Results (from the past 240 hours)  Surgical PCR screen     Status: None   Collection Time: 12/12/24  2:45 PM   Specimen: Nasal Mucosa; Nasal Swab  Result Value Ref Range Status   MRSA, PCR NEGATIVE NEGATIVE Final   Staphylococcus aureus NEGATIVE NEGATIVE Final    Comment: (NOTE) The Xpert SA Assay (FDA approved for NASAL specimens in patients 59 years of age and older), is one component of a comprehensive surveillance program. It is not intended to diagnose infection nor to guide or monitor treatment. Performed at [facility], [address] [place], [place] [address]       Radiology Studies last 3 days: DG HIP UNILAT W OR W/O PELVIS 2-3 VIEWS LEFT Result Date:  12/12/2024 CLINICAL DATA:  History of arthroplasty EXAM: DG HIP (WITH OR WITHOUT PELVIS) 2-3V LEFT COMPARISON:  12/12/2024 FINDINGS: Multiple low resolution intraoperative spot views of the left hip. Total fluoroscopy time was 33 seconds,  fluoroscopic dose of 3.33 mGy. The images demonstrate intramedullary rod and screw fixation of left trochanteric fracture. IMPRESSION: Intraoperative fluoroscopic assistance provided during surgical fixation of left trochanteric fracture. Electronically Signed   By: Luke Bun M.D.   On: 12/12/2024 18:13   DG C-Arm 1-60 Min-No Report Result Date: 12/12/2024 Fluoroscopy was utilized by the requesting physician.  No radiographic interpretation.   DG C-Arm 1-60 Min-No Report Result Date: 12/12/2024 Fluoroscopy was utilized by the requesting physician.  No radiographic interpretation.   DG Chest Portable 1 View Result Date: 12/12/2024 CLINICAL DATA:  Medical clearance EXAM: PORTABLE CHEST 1 VIEW COMPARISON:  January 04, 2024. FINDINGS: Stable cardiomegaly. Status post surgical fixation of sternum as noted on prior study. Right lung is clear. Minimal left basilar subsegmental atelectasis is noted with minimal left pleural effusion. Postsurgical changes are noted in right shoulder. IMPRESSION: Minimal left basilar subsegmental atelectasis with minimal left pleural effusion. Electronically Signed   By: Lynwood Landy Raddle M.D.   On: 12/12/2024 13:42   DG Hip Unilat W or Wo Pelvis 2-3 Views Left Result Date: 12/12/2024 CLINICAL DATA:  Status post fall. EXAM: DG HIP (WITH OR WITHOUT PELVIS) 2-3V LEFT COMPARISON:  February 14, 2021 FINDINGS: There is an acute fracture deformity extending through the inter trochanteric region of the proximal left femur. There is no evidence of dislocation. A radiopaque intramedullary rod and compression screw device are present within the proximal right femur. Soft tissue swelling is seen along the lateral aspect of the left hip. IMPRESSION: Acute  intertrochanteric fracture of the proximal left femur. Electronically Signed   By: Suzen Dials M.D.   On: 12/12/2024 12:39         Zaccheaus Storlie, DO [facility] 12/15/2024, 1:18 PM    Dictation software may have been used to generate the above note. Typos may occur and escape review in typed/dictated notes. Please contact Dr Marsa directly for clarity if needed.  Staff may message me via secure chat in Epic  but this may not receive an immediate response,  please page me for urgent matters!  If 7PM-7AM, please contact night coverage [email or web address]       "

## 2024-12-15 NOTE — Progress Notes (Signed)
" °  Subjective: 3 Days Post-Op Procedures (LRB): FIXATION, FRACTURE, INTERTROCHANTERIC, WITH INTRAMEDULLARY ROD (Left) Patient reports pain as mild-moderate in the left leg.  Patient is well, and has had no acute complaints or problems Plan is to go Rehab after hospital stay. Negative for chest pain and shortness of breath Fever: no Gastrointestinal: Negative for nausea and vomiting Reports he has had a BM since surgery.  Objective: Vital signs in last 24 hours: Temp:  [97.4 F (36.3 C)-98.3 F (36.8 C)] 97.7 F (36.5 C) (01/24 0753) Pulse Rate:  [61-77] 68 (01/24 0753) Resp:  [16-18] 18 (01/24 0753) BP: (86-119)/(51-65) 110/65 (01/24 0402) SpO2:  [96 %-100 %] 98 % (01/24 0753)  Intake/Output from previous day:  Intake/Output Summary (Last 24 hours) at 12/15/2024 0800 Last data filed at 12/14/2024 1125 Gross per 24 hour  Intake 464.69 ml  Output --  Net 464.69 ml    Intake/Output this shift: No intake/output data recorded.  Labs: Recent Labs    12/12/24 1111 12/13/24 0349 12/14/24 0433 12/14/24 1139 12/15/24 0555  HGB 10.7* 8.5* 6.4* 8.9* 8.0*   Recent Labs    12/14/24 0433 12/14/24 1139 12/15/24 0555  WBC 5.5  --  7.6  RBC 2.12*  --  2.76*  HCT 20.3* 27.3* 24.8*  PLT 41*  --  39*   Recent Labs    12/14/24 0433 12/15/24 0555  NA 136 137  K 3.8 3.8  CL 104 106  CO2 24 21*  BUN 36* 38*  CREATININE 2.50* 2.61*  GLUCOSE 123* 116*  CALCIUM  7.9* 8.3*   Recent Labs    12/12/24 1111  INR 1.2   EXAM General - Patient is Alert, Appropriate, and Oriented Extremity - ABD soft Neurovascular intact Sensation intact distally Dorsiflexion/Plantar flexion intact No cellulitis present Compartment soft Dressing/Incision - Mild bloody drainage noted to the left leg honeycomb dressings. Motor Function - intact, moving foot and toes well on exam.  Abdomen soft with intact bowel sounds.  Past Medical History:  Diagnosis Date   Anginal pain    Arthritis     Back ache    CHF (congestive heart failure) (HCC)    Chronic kidney disease    stage 3   COPD (chronic obstructive pulmonary disease) (HCC)    Coronary artery disease    recently checked carotid arteries, significant blockage   Depression    Dyspnea    Hypercholesteremia    Hypertension    Myocardial infarction (HCC)     Assessment/Plan: 3 Days Post-Op Procedures (LRB): FIXATION, FRACTURE, INTERTROCHANTERIC, WITH INTRAMEDULLARY ROD (Left) Principal Problem:   Hip fracture (HCC) Active Problems:   Hip fracture, unspecified laterality, closed, initial encounter (HCC)   Malnutrition of moderate degree  Estimated body mass index is 19.42 kg/m as calculated from the following:   Height as of this encounter: 5' 7 (1.702 m).   Weight as of this encounter: 56.2 kg. Advance diet Up with therapy  Labs and vitals reviewed. Hg 8.0, s/p 1 unit of PRBC yesterday. Patient has had a BM. Up with therapy today. Plan for discharge to SNF.  Ortho will sign off at this time.  Discharge planning: Plan to discharge to rehab.  Follow-up with [facility] orthopedics in 2 weeks for staple removal and x-rays of the left hip.  No showering.  DVT Prophylaxis - Lovenox  and TED hose Weight-Bearing as tolerated to left leg  J. Gustavo Level, PA-C, CAQ-OS Orthopaedic Surgery 12/15/2024, 8:00 AM  "

## 2024-12-16 DIAGNOSIS — S72001A Fracture of unspecified part of neck of right femur, initial encounter for closed fracture: Secondary | ICD-10-CM | POA: Diagnosis not present

## 2024-12-16 LAB — BASIC METABOLIC PANEL WITH GFR
Anion gap: 11 (ref 5–15)
BUN: 41 mg/dL — ABNORMAL HIGH (ref 8–23)
CO2: 21 mmol/L — ABNORMAL LOW (ref 22–32)
Calcium: 8.5 mg/dL — ABNORMAL LOW (ref 8.9–10.3)
Chloride: 106 mmol/L (ref 98–111)
Creatinine, Ser: 2.3 mg/dL — ABNORMAL HIGH (ref 0.61–1.24)
GFR, Estimated: 28 mL/min — ABNORMAL LOW
Glucose, Bld: 103 mg/dL — ABNORMAL HIGH (ref 70–99)
Potassium: 4 mmol/L (ref 3.5–5.1)
Sodium: 137 mmol/L (ref 135–145)

## 2024-12-16 LAB — CBC
HCT: 22.8 % — ABNORMAL LOW (ref 39.0–52.0)
Hemoglobin: 7.4 g/dL — ABNORMAL LOW (ref 13.0–17.0)
MCH: 29 pg (ref 26.0–34.0)
MCHC: 32.5 g/dL (ref 30.0–36.0)
MCV: 89.4 fL (ref 80.0–100.0)
Platelets: 53 10*3/uL — ABNORMAL LOW (ref 150–400)
RBC: 2.55 MIL/uL — ABNORMAL LOW (ref 4.22–5.81)
RDW: 15.2 % (ref 11.5–15.5)
WBC: 7.1 10*3/uL (ref 4.0–10.5)
nRBC: 0 % (ref 0.0–0.2)

## 2024-12-16 MED ORDER — MIDODRINE HCL 5 MG PO TABS
2.5000 mg | ORAL_TABLET | Freq: Three times a day (TID) | ORAL | Status: DC
  Administered 2024-12-16 – 2024-12-17 (×4): 2.5 mg via ORAL
  Filled 2024-12-16 (×3): qty 1

## 2024-12-16 NOTE — Progress Notes (Addendum)
 " PROGRESS NOTE    Mario Patrick   FMW:[id number] DOB: Jul 08, 1941  DOA: 12/12/2024 Date of Service: 12/16/24 which is hospital day 4  PCP: Zachary Idelia LABOR, MD    Mario Patrick is a 84 y.o. male with medical history significant of CAD status post CABG, HTN, chronic HFpEF, CKD stage IIIb, BPH, carotid stenosis, chronic thrombocytopenia, COPD, anxiety/depression, presented with mechanical fall and right hip pain.  HPI: Patient has a chronic ambulatory impairment using roller walker to ambulate, on the morning 01/21, he was using a vacuum to clean his floor and lost his footing with a walker and fell on his right side hip, denies any lightheadedness chest pain shortness of breath before or during the episode, no head or neck injury, no LOC.  At baseline he uses roller walker to ambulate.    Hospital course / significant events:  01/21: ED Course: Afebrile, nontachycardic blood pressure 180/80 O2 saturation 97% on room air.  Hip x-ray showed intertrochanteric fracture of right hip, blood work showed creatinine 1.7 compared to baseline 2.0-2.7 BUN 26 hemoglobin 10.7 WBC 4.5 platelet 60. Calculated revised cardiac risk for primary operation low risk is at 10% significantly increased, cardiology consulted for preop clearance. Pt to OR today, underwent intramedullary nailing of Left femur  01/22: doing well postop, pending SNF placement  01/23: Hgb 6.4 early am --> 1 unit PRBC ordered. Repeat CBC pending. BP soft, systolic 80s. Avoid opiates pending improvement in BP, added midodrine  and hold lisinopril  (kept beta blocker low dose d/t afib hx) 01/24: BP improved systolic 110s. Hgb holding at 8.0. Cr 2.6, added 1L LR infuse slowly today 01/25: Cr 2.3, Hgb to 7.4 likely some hemo dilutional component, transfuse if <7      Consultants:  Orthopedic surgery  Cardiology   Procedures/Surgeries: 12/12/24: Intramedullary nailing of Left femur - Dr GORMAN Blanch       ASSESSMENT & PLAN:    Right intertrochanteric femoral fracture Secondary to mechanical fall S/p Intramedullary nailing of Left femur with cephalomedullary device 12/12/24 PT/OT and anticiapte to rehab  Follow-up with [facility] orthopedics in 2 weeks for staple removal and x-rays of the left hip.  No showering. DVT Prophylaxis - Lovenox  and TED hose Weight-Bearing as tolerated to left leg   ABLA perioperatively  Hemodilution w/ IV fluids  Contributing to hypotension Transfusing 1 unit PRBC 01/23 --> improved   Monitor CBC  Hypotension - improved Tx ABLA as above - hold lovenox  if continued low HH or overt bleed but will continue for now d/t high risk DVT postop  Hold antihypertensive meds (keep beta blocker for rate control, low dose) Tapering down midodrine    History of multivessel CAD status post CABG  Chronic HFpEF HTN HLD Euvolemic Continue home atorvastatin  80 mg daily and zetia  10 mg daily.  HOLD home lisinopril  10 mg daily given anemia and lower BP Continue metoprolol  tartrate 12.5 mg twice daily for Afib, added midodrine  and BP has improved   has not been on aspirin  due to allergy.    COPD Stable Nebs prn O2 prn    AKI on CKD stage IIIb Euvolemic, creatinine level stable but now up some today Optimized renal perfusion w/ increasing BP, unit PRBC yesterday but Cr still up-trending  IV fluids today x1L Holding lisinopril  Monitor BMP      Borderline underweight based on BMI: Body mass index is 19.42 kg/m.[name] Significantly low or high BMI is associated with higher medical risk.  Underweight - under 18  overweight - 25 to 29 obese - 30 or more Class 1 obesity: BMI of 30.0 to 34 Class 2 obesity: BMI of 35.0 to 39 Class 3 obesity: BMI of 40.0 to 49 Super Morbid Obesity: BMI 50-59 Super-super Morbid Obesity: BMI 60+ Healthy nutrition and physical activity advised as adjunct to other disease management and risk reduction treatments    DVT prophylaxis: lovenox , TED hose  IV  fluids: LE continuous IV fluids today for 1L Nutrition: regular diet Central lines / other devices: none  Code Status: DNR ACP documentation reviewed:  none on file in [facility]  TOC needs: SNf rehab placement  Medical barriers to dispo: anemia, low BP.            Subjective / Brief ROS:  Patient confused some this morning, he did have ambien  overnight so this was dc   Family Communication: spoke on hone w/ daughter [name] 12/16/24 2:44 PM     Objective Findings:  Vitals:   12/15/24 2044 12/16/24 0331 12/16/24 0831 12/16/24 1236  BP: (!) 144/62 (!) 128/95 (!) 155/62   Pulse: 74 75 71   Resp:  15 16   Temp:  98.2 F (36.8 C) 97.7 F (36.5 C)   TempSrc:      SpO2:  96% 100% 100%  Weight:      Height:        Intake/Output Summary (Last 24 hours) at 12/16/2024 1331 Last data filed at 12/15/2024 1731 Gross per 24 hour  Intake 718.06 ml  Output --  Net 718.06 ml    Filed Weights   12/12/24 1317 12/12/24 1532  Weight: 56.7 kg 56.2 kg    Examination:  Physical Exam Constitutional:      General: He is not in acute distress. Cardiovascular:     Rate and Rhythm: Normal rate and regular rhythm.  Pulmonary:     Effort: Pulmonary effort is normal.     Breath sounds: Normal breath sounds.  Skin:    General: Skin is warm and dry.  Neurological:     Mental Status: He is alert.          Scheduled Medications:   acetaminophen   1,000 mg Oral Q8H   atorvastatin   80 mg Oral QHS   budesonide -glycopyrrolate -formoterol   2 puff Inhalation BID   Chlorhexidine  Gluconate Cloth  6 each Topical Daily   docusate sodium   100 mg Oral BID   enoxaparin  (LOVENOX ) injection  30 mg Subcutaneous Q24H   ezetimibe   10 mg Oral Daily   feeding supplement  237 mL Oral BID BM   metoprolol  tartrate  12.5 mg Oral BID   midodrine   2.5 mg Oral TID WC   sertraline   150 mg Oral Daily   tamsulosin   0.4 mg Oral Daily   traZODone   100 mg Oral QHS    Continuous Infusions:    PRN  Medications:  albuterol , bisacodyl , methocarbamol  **OR** methocarbamol  (ROBAXIN ) injection, metoCLOPramide  **OR** metoCLOPramide  (REGLAN ) injection, ondansetron  **OR** ondansetron  (ZOFRAN ) IV, oxyCODONE , oxyCODONE , senna-docusate, sodium phosphate, traMADol   Antimicrobials from admission:  Anti-infectives (From admission, onward)    Start     Dose/Rate Route Frequency Ordered Stop   12/12/24 2200  ceFAZolin  (ANCEF ) IVPB 1 g/50 mL premix        1 g 100 mL/hr over 30 Minutes Intravenous Every 6 hours 12/12/24 2100 12/13/24 1244   12/12/24 1600  ceFAZolin  (ANCEF ) IVPB 2g/100 mL premix        2 g 200 mL/hr over 30 Minutes Intravenous On call  to O.R. 12/12/24 1307 12/12/24 1657           Data Reviewed:  I have personally reviewed the following...  CBC: Recent Labs  Lab 12/12/24 1111 12/13/24 0349 12/14/24 0433 12/14/24 1139 12/15/24 0555 12/16/24 0600  WBC 4.5 7.2 5.5  --  7.6 7.1  NEUTROABS 3.2  --   --   --   --   --   HGB 10.7* 8.5* 6.4* 8.9* 8.0* 7.4*  HCT 33.9* 26.9* 20.3* 27.3* 24.8* 22.8*  MCV 93.9 96.1 95.8  --  89.9 89.4  PLT 60* 52* 41*  --  39* 53*   Basic Metabolic Panel: Recent Labs  Lab 12/12/24 1111 12/13/24 0349 12/14/24 0433 12/15/24 0555 12/16/24 0600  NA 141 139 136 137 137  K 4.1 4.5 3.8 3.8 4.0  CL 107 107 104 106 106  CO2 25 24 24  21* 21*  GLUCOSE 119* 122* 123* 116* 103*  BUN 26* 29* 36* 38* 41*  CREATININE 1.79* 1.93* 2.50* 2.61* 2.30*  CALCIUM  8.8* 7.9* 7.9* 8.3* 8.5*   GFR: Estimated Creatinine Clearance: 19.7 mL/min (A) (by C-G formula based on SCr of 2.3 mg/dL (H)). Liver Function Tests: No results for input(s): AST, ALT, ALKPHOS, BILITOT, PROT, ALBUMIN in the last 168 hours. No results for input(s): LIPASE, AMYLASE in the last 168 hours. No results for input(s): AMMONIA in the last 168 hours. Coagulation Profile: Recent Labs  Lab 12/12/24 1111  INR 1.2   Cardiac Enzymes: No results for input(s): CKTOTAL,  CKMB, CKMBINDEX, TROPONINI in the last 168 hours. BNP (last 3 results) No results for input(s): PROBNP in the last 8760 hours. HbA1C: No results for input(s): HGBA1C in the last 72 hours. CBG: No results for input(s): GLUCAP in the last 168 hours. Lipid Profile: No results for input(s): CHOL, HDL, LDLCALC, TRIG, CHOLHDL, LDLDIRECT in the last 72 hours. Thyroid  Function Tests: No results for input(s): TSH, T4TOTAL, FREET4, T3FREE, THYROIDAB in the last 72 hours. Anemia Panel: No results for input(s): VITAMINB12, FOLATE, FERRITIN, TIBC, IRON, RETICCTPCT in the last 72 hours. Most Recent Urinalysis On File:     Component Value Date/Time   COLORURINE STRAW (A) 01/04/2024 2154   APPEARANCEUR CLEAR (A) 01/04/2024 2154   APPEARANCEUR Clear 02/01/2023 1315   LABSPEC 1.005 01/04/2024 2154   PHURINE 6.0 01/04/2024 2154   GLUCOSEU 150 (A) 01/04/2024 2154   HGBUR SMALL (A) 01/04/2024 2154   BILIRUBINUR NEGATIVE 01/04/2024 2154   BILIRUBINUR Negative 02/01/2023 1315   KETONESUR NEGATIVE 01/04/2024 2154   PROTEINUR 30 (A) 01/04/2024 2154   NITRITE NEGATIVE 01/04/2024 2154   LEUKOCYTESUR TRACE (A) 01/04/2024 2154   Sepsis Labs: @LABRCNTIP (procalcitonin:4,lacticidven:4) Microbiology: Recent Results (from the past 240 hours)  Surgical PCR screen     Status: None   Collection Time: 12/12/24  2:45 PM   Specimen: Nasal Mucosa; Nasal Swab  Result Value Ref Range Status   MRSA, PCR NEGATIVE NEGATIVE Final   Staphylococcus aureus NEGATIVE NEGATIVE Final    Comment: (NOTE) The Xpert SA Assay (FDA approved for NASAL specimens in patients 11 years of age and older), is one component of a comprehensive surveillance program. It is not intended to diagnose infection nor to guide or monitor treatment. Performed at [facility], [address] [place], [place] [address]       Radiology Studies last 3 days: DG HIP UNILAT W OR W/O PELVIS 2-3  VIEWS LEFT Result Date: 12/12/2024 CLINICAL DATA:  History of arthroplasty EXAM: DG HIP (WITH  OR WITHOUT PELVIS) 2-3V LEFT COMPARISON:  12/12/2024 FINDINGS: Multiple low resolution intraoperative spot views of the left hip. Total fluoroscopy time was 33 seconds, fluoroscopic dose of 3.33 mGy. The images demonstrate intramedullary rod and screw fixation of left trochanteric fracture. IMPRESSION: Intraoperative fluoroscopic assistance provided during surgical fixation of left trochanteric fracture. Electronically Signed   By: Luke Bun M.D.   On: 12/12/2024 18:13   DG C-Arm 1-60 Min-No Report Result Date: 12/12/2024 Fluoroscopy was utilized by the requesting physician.  No radiographic interpretation.   DG C-Arm 1-60 Min-No Report Result Date: 12/12/2024 Fluoroscopy was utilized by the requesting physician.  No radiographic interpretation.         Kelsen Celona, DO [facility] 12/16/2024, 1:31 PM    Dictation software may have been used to generate the above note. Typos may occur and escape review in typed/dictated notes. Please contact Dr Marsa directly for clarity if needed.  Staff may message me via secure chat in Epic  but this may not receive an immediate response,  please page me for urgent matters!  If 7PM-7AM, please contact night coverage [email or web address]       "

## 2024-12-16 NOTE — Progress Notes (Signed)
 Physical Therapy Treatment Patient Details Name: Mario Patrick MRN: [id number] DOB: [birth year] Today's Date: 12/16/2024   History of Present Illness presented to ER secondary to mechanical fall with acute onset of R hip pain; admitted for management of intertrochanteric hip fracture, s/p IM nailing (1/21), WBAT    PT Comments  Patient received sleeping in bed, wakes to his name. Patient is groggy initially, but is agreeable to PT session. He requires max A for supine to sit. Reports no significant pain. Able to sit edge of bed without external support. Patient is confused this session. States he is in [place]. Patient is able to stand with RW and max A. Unable to take any steps this session and returned to supine with +2 assist. Patient will continue to benefit from skilled PT to improve independence.      If plan is discharge home, recommend the following: A lot of help with walking and/or transfers;A lot of help with bathing/dressing/bathroom   Can travel by private vehicle     No  Equipment Recommendations  None recommended by PT    Recommendations for Other Services       Precautions / Restrictions Precautions Precautions: Fall Recall of Precautions/Restrictions: Impaired Restrictions Weight Bearing Restrictions Per Provider Order: Yes     Mobility  Bed Mobility Overal bed mobility: Needs Assistance Bed Mobility: Supine to Sit, Sit to Supine     Supine to sit: Max assist Sit to supine: Total assist, +2 for physical assistance   General bed mobility comments: patient able to come to sitting edge of bed with max +1. Required +2 to get back into bed and slid up in bed.    Transfers Overall transfer level: Needs assistance Equipment used: Rolling walker (2 wheels) Transfers: Sit to/from Stand Sit to Stand: Max assist, From elevated surface           General transfer comment: unable to get balance in standing or take any steps. Posterior lean     Ambulation/Gait               General Gait Details: unable   Stairs             Wheelchair Mobility     Tilt Bed    Modified Rankin (Stroke Patients Only)       Balance Overall balance assessment: Needs assistance Sitting-balance support: Feet supported Sitting balance-Leahy Scale: Good Sitting balance - Comments: supervision for sitting balance Postural control: Posterior lean Standing balance support: Bilateral upper extremity supported, During functional activity, Reliant on assistive device for balance Standing balance-Leahy Scale: Poor                              Communication Communication Communication: Impaired Factors Affecting Communication: Hearing impaired;Reduced clarity of speech;Difficulty expressing self  Cognition Arousal: Alert Behavior During Therapy: WFL for tasks assessed/performed                           PT - Cognition Comments: states he is in [place], and asking why when requesting patient to get up. Following commands: Impaired Following commands impaired: Follows one step commands inconsistently, Follows one step commands with increased time    Cueing Cueing Techniques: Verbal cues, Gestural cues, Tactile cues  Exercises      General Comments        Pertinent Vitals/Pain Pain Assessment Pain Assessment: PAINAD Breathing: normal Negative Vocalization: none Facial  Expression: smiling or inexpressive Body Language: relaxed Consolability: no need to console PAINAD Score: 0 Pain Location: L hip Pain Descriptors / Indicators: Grimacing Pain Intervention(s): Monitored during session, Repositioned    Home Living                          Prior Function            PT Goals (current goals can now be found in the care plan section) Acute Rehab PT Goals Patient Stated Goal: agreeable to participation with session PT Goal Formulation: With patient Time For Goal Achievement:  12/27/24 Potential to Achieve Goals: Fair Progress towards PT goals: Progressing toward goals    Frequency    Min 3X/week      PT Plan      Co-evaluation              AM-PAC PT 6 Clicks Mobility   Outcome Measure  Help needed turning from your back to your side while in a flat bed without using bedrails?: A Lot Help needed moving from lying on your back to sitting on the side of a flat bed without using bedrails?: A Lot Help needed moving to and from a bed to a chair (including a wheelchair)?: A Lot Help needed standing up from a chair using your arms (e.g., wheelchair or bedside chair)?: A Lot Help needed to walk in hospital room?: Total Help needed climbing 3-5 steps with a railing? : Total 6 Click Score: 10    End of Session Equipment Utilized During Treatment: Gait belt Activity Tolerance: Patient limited by fatigue Patient left: in bed;with call bell/phone within reach;with bed alarm set Nurse Communication: Mobility status PT Visit Diagnosis: Unsteadiness on feet (R26.81);Other abnormalities of gait and mobility (R26.89);Difficulty in walking, not elsewhere classified (R26.2);Pain Pain - Right/Left: Left Pain - part of body: Hip     Time: 1125-1141 PT Time Calculation (min) (ACUTE ONLY): 16 min  Charges:    $Therapeutic Activity: 8-22 mins PT General Charges $$ ACUTE PT VISIT: 1 Visit                     Gracianna Vink, PT, GCS 12/16/24,12:42 PM

## 2024-12-16 NOTE — Care Management Important Message (Signed)
 Important Message  Patient Details  Name: Mario Patrick MRN: [id number] Date of Birth: 1941-08-20   Important Message Given:  Yes - Medicare IM     Tinia Oravec W, CMA 12/16/2024, 12:18 PM

## 2024-12-16 NOTE — Progress Notes (Signed)
 CCMD called this nurse stating that The pt was stating at 67 with a good pleth. Upon checking on the pt the pt was asleep. This nurse woke the pt up and turned the oxygen up to 4L to help the pt recover. Once noted that the pt was having a difficult time recovering this nurse called respiratory to come assess the pt. Respiratory noted that the pt had the bottom plate in the throat upon suction of the pt. RT was able to recover teeth and pt will be reassessed.

## 2024-12-16 NOTE — Progress Notes (Signed)
 [facility] Cardiology    SUBJECTIVE: Denies any further pain feels reasonably well hip pain much improved no chest pain no shortness of breath.  Resting comfortably in bed respiratory follow-up   Vitals:   12/15/24 2005 12/15/24 2044 12/16/24 0331 12/16/24 0831  BP: (!) 144/62 (!) 144/62 (!) 128/95 (!) 155/62  Pulse: 74 74 75 71  Resp: 16  15 16   Temp: 98.2 F (36.8 C)  98.2 F (36.8 C) 97.7 F (36.5 C)  TempSrc: Oral     SpO2: 100%  96% 100%  Weight:      Height:         Intake/Output Summary (Last 24 hours) at 12/16/2024 1155 Last data filed at 12/15/2024 1731 Gross per 24 hour  Intake 718.06 ml  Output --  Net 718.06 ml      PHYSICAL EXAM  General: Well developed, well nourished, in no acute distress HEENT:  Normocephalic and atramatic Neck:  No JVD.  Lungs: Clear bilaterally to auscultation and percussion. Heart: HRRR . Normal S1 and S2 without gallops or murmurs.  Abdomen: Bowel sounds are positive, abdomen soft and non-tender  Msk:  Back normal, normal gait. Normal strength and tone for age. Extremities: No clubbing, cyanosis or edema.   Neuro: Alert and oriented X 3. Psych:  Good affect, responds appropriately   LABS: Basic Metabolic Panel: Recent Labs    12/15/24 0555 12/16/24 0600  NA 137 137  K 3.8 4.0  CL 106 106  CO2 21* 21*  GLUCOSE 116* 103*  BUN 38* 41*  CREATININE 2.61* 2.30*  CALCIUM  8.3* 8.5*   Liver Function Tests: No results for input(s): AST, ALT, ALKPHOS, BILITOT, PROT, ALBUMIN in the last 72 hours. No results for input(s): LIPASE, AMYLASE in the last 72 hours. CBC: Recent Labs    12/15/24 0555 12/16/24 0600  WBC 7.6 7.1  HGB 8.0* 7.4*  HCT 24.8* 22.8*  MCV 89.9 89.4  PLT 39* 53*   Cardiac Enzymes: No results for input(s): CKTOTAL, CKMB, CKMBINDEX, TROPONINI in the last 72 hours. BNP: Invalid input(s): POCBNP D-Dimer: No results for input(s): DDIMER in the last 72 hours. Hemoglobin A1C: No  results for input(s): HGBA1C in the last 72 hours. Fasting Lipid Panel: No results for input(s): CHOL, HDL, LDLCALC, TRIG, CHOLHDL, LDLDIRECT in the last 72 hours. Thyroid  Function Tests: No results for input(s): TSH, T4TOTAL, T3FREE, THYROIDAB in the last 72 hours.  Invalid input(s): FREET3 Anemia Panel: No results for input(s): VITAMINB12, FOLATE, FERRITIN, TIBC, IRON, RETICCTPCT in the last 72 hours.  No results found.   Echo   TELEMETRY: :  ASSESSMENT AND PLAN:  Principal Problem:   Hip fracture (HCC) Active Problems:   Hip fracture, unspecified laterality, closed, initial encounter (HCC)   Malnutrition of moderate degree    Plan Postop total hip repair replacement after mechanical fall doing reasonably well stable recommend physical therapy rehab Multivessel coronary disease continue conservative management Poor nutrition increase protein intake add supplemental shakes to his diet Referred to physical therapy for his rehab post fall post hip replacement History of atrial fibrillation rate controlled continue conservative therapy   Cara JONETTA Lovelace, MD 12/16/2024 11:55 AM

## 2024-12-16 NOTE — Plan of Care (Signed)
  Problem: Clinical Measurements: Goal: Respiratory complications will improve Outcome: Progressing   Problem: Activity: Goal: Risk for activity intolerance will decrease Outcome: Progressing   Problem: Elimination: Goal: Will not experience complications related to bowel motility Outcome: Progressing   Problem: Safety: Goal: Ability to remain free from injury will improve Outcome: Progressing   

## 2024-12-16 NOTE — Plan of Care (Signed)
 " Problem: Education: Goal: Knowledge of General Education information will improve Description: Including pain rating scale, medication(s)/side effects and non-pharmacologic comfort measures 12/16/2024 0114 by Geralynn Penton, RN Outcome: Progressing 12/16/2024 0114 by Geralynn Penton, RN Outcome: Not Progressing   Problem: Health Behavior/Discharge Planning: Goal: Ability to manage health-related needs will improve 12/16/2024 0114 by Geralynn Penton, RN Outcome: Progressing 12/16/2024 0114 by Geralynn Penton, RN Outcome: Not Progressing   Problem: Clinical Measurements: Goal: Ability to maintain clinical measurements within normal limits will improve 12/16/2024 0114 by Geralynn Penton, RN Outcome: Progressing 12/16/2024 0114 by Geralynn Penton, RN Outcome: Not Progressing Goal: Will remain free from infection 12/16/2024 0114 by Geralynn Penton, RN Outcome: Progressing 12/16/2024 0114 by Geralynn Penton, RN Outcome: Not Progressing Goal: Diagnostic test results will improve 12/16/2024 0114 by Geralynn Penton, RN Outcome: Progressing 12/16/2024 0114 by Geralynn Penton, RN Outcome: Not Progressing Goal: Respiratory complications will improve 12/16/2024 0114 by Geralynn Penton, RN Outcome: Progressing 12/16/2024 0114 by Geralynn Penton, RN Outcome: Not Progressing Goal: Cardiovascular complication will be avoided 12/16/2024 0114 by Geralynn Penton, RN Outcome: Progressing 12/16/2024 0114 by Geralynn Penton, RN Outcome: Not Progressing   Problem: Activity: Goal: Risk for activity intolerance will decrease 12/16/2024 0114 by Geralynn Penton, RN Outcome: Progressing 12/16/2024 0114 by Geralynn Penton, RN Outcome: Not Progressing   Problem: Nutrition: Goal: Adequate nutrition will be maintained 12/16/2024 0114 by Geralynn Penton, RN Outcome: Progressing 12/16/2024 0114 by Geralynn Penton, RN Outcome: Not Progressing   Problem: Coping: Goal: Level  of anxiety will decrease 12/16/2024 0114 by Geralynn Penton, RN Outcome: Progressing 12/16/2024 0114 by Geralynn Penton, RN Outcome: Not Progressing   Problem: Elimination: Goal: Will not experience complications related to bowel motility 12/16/2024 0114 by Geralynn Penton, RN Outcome: Progressing 12/16/2024 0114 by Geralynn Penton, RN Outcome: Not Progressing Goal: Will not experience complications related to urinary retention 12/16/2024 0114 by Geralynn Penton, RN Outcome: Progressing 12/16/2024 0114 by Geralynn Penton, RN Outcome: Not Progressing   Problem: Pain Managment: Goal: General experience of comfort will improve and/or be controlled 12/16/2024 0114 by Geralynn Penton, RN Outcome: Progressing 12/16/2024 0114 by Geralynn Penton, RN Outcome: Not Progressing   Problem: Safety: Goal: Ability to remain free from injury will improve 12/16/2024 0114 by Geralynn Penton, RN Outcome: Progressing 12/16/2024 0114 by Geralynn Penton, RN Outcome: Not Progressing   Problem: Skin Integrity: Goal: Risk for impaired skin integrity will decrease 12/16/2024 0114 by Geralynn Penton, RN Outcome: Progressing 12/16/2024 0114 by Geralynn Penton, RN Outcome: Not Progressing   Problem: Education: Goal: Knowledge of the prescribed therapeutic regimen will improve 12/16/2024 0114 by Geralynn Penton, RN Outcome: Progressing 12/16/2024 0114 by Geralynn Penton, RN Outcome: Not Progressing   Problem: Bowel/Gastric: Goal: Gastrointestinal status for postoperative course will improve 12/16/2024 0114 by Geralynn Penton, RN Outcome: Progressing 12/16/2024 0114 by Geralynn Penton, RN Outcome: Not Progressing   Problem: Cardiac: Goal: Ability to maintain an adequate cardiac output 12/16/2024 0114 by Geralynn Penton, RN Outcome: Progressing 12/16/2024 0114 by Geralynn Penton, RN Outcome: Not Progressing Goal: Will show no evidence of cardiac arrhythmias 12/16/2024 0114  by Geralynn Penton, RN Outcome: Progressing 12/16/2024 0114 by Geralynn Penton, RN Outcome: Not Progressing   Problem: Nutritional: Goal: Will attain and maintain optimal nutritional status 12/16/2024 0114 by Geralynn Penton, RN Outcome: Progressing 12/16/2024 0114 by Geralynn Penton, RN Outcome: Not Progressing   Problem: Neurological: Goal: Will regain or maintain usual level of consciousness 12/16/2024 0114 by Geralynn Penton, RN Outcome: Progressing 12/16/2024 0114 by Geralynn Penton, RN Outcome: Not Progressing  Problem: Clinical Measurements: Goal: Ability to maintain clinical measurements within normal limits 12/16/2024 0114 by Geralynn Penton, RN Outcome: Progressing 12/16/2024 0114 by Geralynn Penton, RN Outcome: Not Progressing Goal: Postoperative complications will be avoided or minimized 12/16/2024 0114 by Geralynn Penton, RN Outcome: Progressing 12/16/2024 0114 by Geralynn Penton, RN Outcome: Not Progressing   Problem: Respiratory: Goal: Will regain and/or maintain adequate ventilation 12/16/2024 0114 by Geralynn Penton, RN Outcome: Progressing 12/16/2024 0114 by Geralynn Penton, RN Outcome: Not Progressing Goal: Respiratory status will improve 12/16/2024 0114 by Geralynn Penton, RN Outcome: Progressing 12/16/2024 0114 by Geralynn Penton, RN Outcome: Not Progressing   Problem: Skin Integrity: Goal: Demonstrates signs of wound healing without infection 12/16/2024 0114 by Geralynn Penton, RN Outcome: Progressing 12/16/2024 0114 by Geralynn Penton, RN Outcome: Not Progressing   Problem: Urinary Elimination: Goal: Will remain free from infection 12/16/2024 0114 by Geralynn Penton, RN Outcome: Progressing 12/16/2024 0114 by Geralynn Penton, RN Outcome: Not Progressing Goal: Ability to achieve and maintain adequate urine output 12/16/2024 0114 by Geralynn Penton, RN Outcome: Progressing 12/16/2024 0114 by Geralynn Penton,  RN Outcome: Not Progressing   "

## 2024-12-17 DIAGNOSIS — S72001A Fracture of unspecified part of neck of right femur, initial encounter for closed fracture: Secondary | ICD-10-CM | POA: Diagnosis not present

## 2024-12-17 LAB — BASIC METABOLIC PANEL WITH GFR
Anion gap: 9 (ref 5–15)
BUN: 45 mg/dL — ABNORMAL HIGH (ref 8–23)
CO2: 23 mmol/L (ref 22–32)
Calcium: 8.7 mg/dL — ABNORMAL LOW (ref 8.9–10.3)
Chloride: 107 mmol/L (ref 98–111)
Creatinine, Ser: 2.34 mg/dL — ABNORMAL HIGH (ref 0.61–1.24)
GFR, Estimated: 27 mL/min — ABNORMAL LOW
Glucose, Bld: 91 mg/dL (ref 70–99)
Potassium: 4.3 mmol/L (ref 3.5–5.1)
Sodium: 139 mmol/L (ref 135–145)

## 2024-12-17 LAB — CBC
HCT: 22.5 % — ABNORMAL LOW (ref 39.0–52.0)
Hemoglobin: 7.3 g/dL — ABNORMAL LOW (ref 13.0–17.0)
MCH: 29.1 pg (ref 26.0–34.0)
MCHC: 32.4 g/dL (ref 30.0–36.0)
MCV: 89.6 fL (ref 80.0–100.0)
Platelets: 70 10*3/uL — ABNORMAL LOW (ref 150–400)
RBC: 2.51 MIL/uL — ABNORMAL LOW (ref 4.22–5.81)
RDW: 15.4 % (ref 11.5–15.5)
WBC: 5.1 10*3/uL (ref 4.0–10.5)
nRBC: 0 % (ref 0.0–0.2)

## 2024-12-17 LAB — PREPARE RBC (CROSSMATCH)

## 2024-12-17 MED ORDER — BOOST / RESOURCE BREEZE PO LIQD CUSTOM
1.0000 | Freq: Three times a day (TID) | ORAL | Status: DC
  Administered 2024-12-17 – 2024-12-19 (×2): 1 via ORAL

## 2024-12-17 MED ORDER — SODIUM CHLORIDE 0.9% IV SOLUTION: Freq: Once | INTRAVENOUS | Status: AC

## 2024-12-17 NOTE — TOC Progression Note (Addendum)
 Transition of Care [facility]) - Progression Note    Patient Details  Name: Mario Patrick MRN: [id number] Date of Birth: Jul 06, 1941  [facility] CM/SW Contact  Mario JONETTA Hamilton, RN Phone Number: 12/17/2024, 1:58 PM  Clinical Narrative:     Patient's daughter [name] provided 3 rehab facilities over the weekend.   [facility]- Spoke with Toribio [phone], ok to send referral to fax number [phone].   [facility]- lvmm requesting to return my call.   [facility]- referral pending in [place]. Lvmm requesting Admissions Director Santana Medley to return my call  Expected Discharge Plan: Skilled Nursing Facility Barriers to Discharge: Continued Medical Work up (anemia/blood pressure)               Expected Discharge Plan and Services   Discharge Planning Services: CM Consult   Living arrangements for the past 2 months: Single Family Home                                       Social Drivers of Health (SDOH) Interventions SDOH Screenings   Food Insecurity: No Food Insecurity (12/12/2024)  Housing: Low Risk (12/12/2024)  Transportation Needs: No Transportation Needs (12/12/2024)  Utilities: Not At Risk (12/12/2024)  Financial Resource Strain: Low Risk  (11/07/2024)   Received from [facility]  Social Connections: Moderately Integrated (12/13/2024)  Tobacco Use: Medium Risk (12/12/2024)    Readmission Risk Interventions     No data to display

## 2024-12-17 NOTE — Progress Notes (Signed)
 Occupational Therapy Treatment Patient Details Name: Mario Patrick MRN: [id number] DOB: [birth year] Today's Date: 12/17/2024   History of present illness presented to ER secondary to mechanical fall with acute onset of R hip pain; admitted for management of intertrochanteric hip fracture, s/p IM nailing (1/21), WBAT   OT comments  Pt seen for overlapping OT/PT co-tx to optimize safety with ADL mobility. Pt endorsing L hip pain prior to moving but agreeable to session. Pt required MAX A +2 for bed mobility, tolerated sitting EOB with supervision and no LOB noted. Stedy and MIN A +2 to stand and transfer to [facility] and then recliner. Pt required MAX A for pericare in standing after soft BM. Pt required cues for hand placement during transfers, balance poor requiring BUE support throughout all standing efforts. Pt progressing, continues to benefit.       If plan is discharge home, recommend the following:  A lot of help with walking and/or transfers;A lot of help with bathing/dressing/bathroom;Assistance with cooking/housework;Assist for transportation;Help with stairs or ramp for entrance   Equipment Recommendations  BSC/3in1    Recommendations for Other Services      Precautions / Restrictions Precautions Precautions: Fall Recall of Precautions/Restrictions: Impaired Restrictions Weight Bearing Restrictions Per Provider Order: Yes RLE Weight Bearing Per Provider Order: Weight bearing as tolerated LLE Weight Bearing Per Provider Order: Weight bearing as tolerated       Mobility Bed Mobility Overal bed mobility: Needs Assistance Bed Mobility: Supine to Sit     Supine to sit: +2 for physical assistance, HOB elevated          Transfers Overall transfer level: Needs assistance Equipment used: Ambulation equipment used Transfers: Sit to/from Stand, Bed to chair/wheelchair/BSC Sit to Stand: Min assist, +2 physical assistance, Via lift equipment           General transfer  comment: stedy lift on and off commode then to recliner for breakfast     Balance Overall balance assessment: Needs assistance Sitting-balance support: Feet supported Sitting balance-Leahy Scale: Fair     Standing balance support: Bilateral upper extremity supported, During functional activity, Reliant on assistive device for balance Standing balance-Leahy Scale: Poor                             ADL either performed or assessed with clinical judgement   ADL Overall ADL's : Needs assistance/impaired                                       General ADL Comments: Pt required MAX A for socks at bed level, MAX A for pericare after BM in standing with PTA providing assist for standing balance    Extremity/Trunk Assessment              Vision       Perception     Praxis     Communication Communication Communication: Impaired Factors Affecting Communication: Hearing impaired;Reduced clarity of speech;Difficulty expressing self   Cognition Arousal: Alert Behavior During Therapy: WFL for tasks assessed/performed                                 Following commands: Impaired Following commands impaired: Follows one step commands inconsistently, Follows one step commands with increased time      Cueing  Cueing Techniques: Verbal cues, Gestural cues, Tactile cues  Exercises      Shoulder Instructions       General Comments Pt on RA throughout, SpO2 90%, denies SOB. RN notified.    Pertinent Vitals/ Pain       Pain Assessment Pain Assessment: 0-10 Pain Score: 9  Pain Location: L hip Pain Descriptors / Indicators: Grimacing, Aching Pain Intervention(s): Limited activity within patient's tolerance, Monitored during session, Repositioned  Home Living                                          Prior Functioning/Environment              Frequency  Min 2X/week        Progress Toward Goals  OT  Goals(current goals can now be found in the care plan section)  Progress towards OT goals: Progressing toward goals  Acute Rehab OT Goals Patient Stated Goal: improve pain OT Goal Formulation: With patient Time For Goal Achievement: 12/28/24 Potential to Achieve Goals: Good  Plan      Co-evaluation    PT/OT/SLP Co-Evaluation/Treatment: Yes Reason for Co-Treatment: For patient/therapist safety;To address functional/ADL transfers PT goals addressed during session: Mobility/safety with mobility OT goals addressed during session: ADL's and self-care      AM-PAC OT 6 Clicks Daily Activity     Outcome Measure   Help from another person eating meals?: None Help from another person taking care of personal grooming?: A Little Help from another person toileting, which includes using toliet, bedpan, or urinal?: A Lot Help from another person bathing (including washing, rinsing, drying)?: A Lot Help from another person to put on and taking off regular upper body clothing?: A Little Help from another person to put on and taking off regular lower body clothing?: A Lot 6 Click Score: 16    End of Session    OT Visit Diagnosis: Unsteadiness on feet (R26.81);Repeated falls (R29.6);Muscle weakness (generalized) (M62.81);History of falling (Z91.81)   Activity Tolerance Patient tolerated treatment well   Patient Left in chair;with call bell/phone within reach;with chair alarm set;with nursing/sitter in room   Nurse Communication Mobility status        Time: 9091-9067 OT Time Calculation (min): 24 min  Charges: OT General Charges $OT Visit: 1 Visit OT Treatments $Self Care/Home Management : 8-22 mins  Warren SAUNDERS., MPH, MS, OTR/L ascom [phone] 12/17/24, 10:07 AM

## 2024-12-17 NOTE — Progress Notes (Signed)
 Nutrition Follow-up  DOCUMENTATION CODES:   Non-severe (moderate) malnutrition in context of chronic illness  INTERVENTION:   -Continue regular diet -Continue MVI with minerals dail;y -D/c Ensure Plus High Protein po BID, each supplement provides 350 kcal and 20 grams of protein  -Continue Magic cup TID with meals, each supplement provides 290 kcal and 9 grams of protein  -Boost Breeze po TID, each supplement provides 250 kcal and 9 grams of protein  -Last BM on 12/10/24; patient on bowel regimen   NUTRITION DIAGNOSIS:   Moderate Malnutrition related to chronic illness (COPD, CHF) as evidenced by mild fat depletion, moderate fat depletion, mild muscle depletion, moderate muscle depletion.  Ongoing  GOAL:   Patient will meet greater than or equal to 90% of their needs  Progressing   MONITOR:   PO intake, Supplement acceptance  REASON FOR ASSESSMENT:   Consult Assessment of nutrition requirement/status, Hip fracture protocol  ASSESSMENT:   84 y.o. male with medical history significant of CAD status post CABG, HTN, chronic HFpEF, CKD stage IIIb, BPH, carotid stenosis, chronic thrombocytopenia, COPD, anxiety/depression, presented with mechanical fall and right hip pain.  1/21- s/p Intramedullary nailing of Left femur with cephalomedullary device   Reviewed I/O's: -720 ml x 24 hours and +2 L since admission  UOP: 720 ml x 24 hours  Patient unavailable at time of visit. Attempted to speak with patient via call to hospital room phone, however, unable to reach. RD unable to obtain further nutrition-related history or complete nutrition-focused physical exam at this time.    Case discussed with RN; she is unsure how patient is eating. Per meal completion records, patient consumed 100% of breakfast (291 kcals and 13 grams protein). He is refusing Ensure supplements.   No new weight since last visit.  No BM since 12/11/23. Noted colace and PRN senokot added on 12/12/24.RN  administered both today.  Per TOC notes, plan to d/c to SNF once medically stable.   Medications reviewed and include colace and lovenox .   Labs reviewed.   Diet Order:   Diet Order             Diet regular Room service appropriate? Yes; Fluid consistency: Thin  Diet effective now                   EDUCATION NEEDS:   Education needs have been addressed  Skin:  Skin Assessment: Skin Integrity Issues: Skin Integrity Issues:: Incisions Incisions: closed left hip, closed left thigh  Last BM:  12/10/24  Height:   Ht Readings from Last 1 Encounters:  12/12/24 5' 7 (1.702 m)    Weight:   Wt Readings from Last 1 Encounters:  12/12/24 56.2 kg    Ideal Body Weight:  67.3 kg  BMI:  Body mass index is 19.42 kg/m.  Estimated Nutritional Needs:   Kcal:  1700-1900  Protein:  90-105 grams  Fluid:  1.7-1.9 L    Margery ORN, RD, LDN, CDCES Registered Dietitian III Certified Diabetes Care and Education Specialist If unable to reach this RD, please use RD Inpatient group chat on secure chat between hours of 8am-4 pm daily

## 2024-12-17 NOTE — Progress Notes (Signed)
 " PROGRESS NOTE    Mario Patrick   FMW:[id number] DOB: Aug 15, 1941  DOA: 12/12/2024 Date of Service: 12/17/24 which is hospital day 5  PCP: Zachary Idelia LABOR, MD    Mario Patrick is a 84 y.o. male with medical history significant of CAD status post CABG, HTN, chronic HFpEF, CKD stage IIIb, BPH, carotid stenosis, chronic thrombocytopenia, COPD, anxiety/depression, presented with mechanical fall and right hip pain.  HPI: Patient has a chronic ambulatory impairment using roller walker to ambulate, on the morning 01/21, he was using a vacuum to clean his floor and lost his footing with a walker and fell on his right side hip, denies any lightheadedness chest pain shortness of breath before or during the episode, no head or neck injury, no LOC.  At baseline he uses roller walker to ambulate.    Hospital course / significant events:  01/21: ED Course: Afebrile, nontachycardic blood pressure 180/80 O2 saturation 97% on room air.  Hip x-ray showed intertrochanteric fracture of right hip, blood work showed creatinine 1.7 compared to baseline 2.0-2.7 BUN 26 hemoglobin 10.7 WBC 4.5 platelet 60. Calculated revised cardiac risk for primary operation low risk is at 10% significantly increased, cardiology consulted for preop clearance. Pt to OR today, underwent intramedullary nailing of Left femur  01/22: doing well postop, pending SNF placement  01/23: Hgb 6.4 early am --> 1 unit PRBC ordered. Repeat CBC pending. BP soft, systolic 80s. Avoid opiates pending improvement in BP, added midodrine  and hold lisinopril  (kept beta blocker low dose d/t afib hx) 01/24: BP improved systolic 110s. Hgb holding at 8.0. Cr 2.6, added 1L LR infuse slowly today 01/25: Cr 2.3, Hgb to 7.4 likely some hemo dilutional component, transfuse if <7 01/26: review nephrology notes, he has Dx CKD4 and fluctuating Cr/GFR but appears to be close to baseline now, will need close follow up. DC midodrine  today. Pt much more alert.  Pending SNF placement.       Consultants:  Orthopedic surgery  Cardiology   Procedures/Surgeries: 12/12/24: Intramedullary nailing of Left femur - Dr GORMAN Blanch       ASSESSMENT & PLAN:   Right intertrochanteric femoral fracture Secondary to mechanical fall S/p Intramedullary nailing of Left femur with cephalomedullary device 12/12/24 PT/OT and plan to rehab  Follow-up with [facility] clinic orthopedics in 2 weeks for staple removal and x-rays of the left hip.  DVT Prophylaxis - Lovenox  and TED hose Weight-Bearing as tolerated to left leg   ABLA perioperatively  Hemodilution w/ IV fluids  Impaired hematopoesis w/ CKD4 Contributing to hypotension 1 unit PRBC 01/23 --> improved   Monitor CBC --> remains low around 7.3-7.4, will transfuse another unit (hematopoesis will be difficult w/ CKD4 and malnourishment hopefully additional PRBC will help with short term recovery)  hold lovenox  if continued low HH or overt bleed but will continue for now d/t high risk DVT postop   Hypotension - improved Tx anemia as above  Hold home antihypertensive meds (keep beta blocker for rate control, low dose) for now but can restart if toelrating off midodrine  Tapering down midodrine  --> dc today   History of multivessel CAD status post CABG  Chronic HFpEF HTN HLD Euvolemic Continue home atorvastatin  80 mg daily and zetia  10 mg daily.  held home lisinopril  10 mg daily given anemia and lower BP but can try restarting this lower dose once off midodrine  (see below also for CKD) Continue metoprolol  tartrate 12.5 mg twice daily for Afib, midodrine  and BP has improved  has not been on aspirin  due to allergy.    COPD Stable Nebs prn O2 prn    CKD stage IIIb/IV Euvolemic, AKI ruled OUT Reviewed most recent nephrology notes 07/2024 - CKDIV and baseline GFR 28, though more recent labs show Cr 1.9-2.0, GFR 33-34 Holding lisinopril  --> can resume this once of fmidodrine + Jardiance to delay kidney  disease progression per nephrology note  Monitor BMP      Borderline underweight based on BMI: Body mass index is 19.42 kg/m.[name] Significantly low or high BMI is associated with higher medical risk.  Underweight - under 18  overweight - 25 to 29 obese - 30 or more Class 1 obesity: BMI of 30.0 to 34 Class 2 obesity: BMI of 35.0 to 39 Class 3 obesity: BMI of 40.0 to 49 Super Morbid Obesity: BMI 50-59 Super-super Morbid Obesity: BMI 60+ Healthy nutrition and physical activity advised as adjunct to other disease management and risk reduction treatments    DVT prophylaxis: lovenox , TED hose  IV fluids: LE continuous IV fluids today for 1L Nutrition: regular diet Central lines / other devices: none  Code Status: DNR ACP documentation reviewed:  none on file in [facility]  TOC needs: SNf rehab placement  Medical barriers to dispo: anemia, low BP. Anticiapte medically stable tomorrow             Subjective / Brief ROS:  Patient more alert today States he's been in the chair a few hours and would like to get back in bed Hip is uncomfortable but pain is controlled for te most part. No other complaints.   Family Communication: spoke on hone w/ daughter [name] 12/17/24 2:29 PM     Objective Findings:  Vitals:   12/17/24 0333 12/17/24 0817 12/17/24 1002 12/17/24 1147  BP: (!) 151/63 125/73    Pulse: 61 67  64  Resp: 16 16    Temp: 98.5 F (36.9 C) 97.7 F (36.5 C)    TempSrc:      SpO2: 100% 93% 90% 97%  Weight:      Height:        Intake/Output Summary (Last 24 hours) at 12/17/2024 1429 Last data filed at 12/17/2024 1409 Gross per 24 hour  Intake 270 ml  Output 500 ml  Net -230 ml    Filed Weights   12/12/24 1317 12/12/24 1532  Weight: 56.7 kg 56.2 kg    Examination:  Physical Exam Constitutional:      General: He is not in acute distress. Cardiovascular:     Rate and Rhythm: Normal rate and regular rhythm.  Pulmonary:     Effort: Pulmonary effort is  normal.     Breath sounds: Normal breath sounds.  Skin:    General: Skin is warm and dry.  Neurological:     Mental Status: He is alert.          Scheduled Medications:   sodium chloride    Intravenous Once   acetaminophen   1,000 mg Oral Q8H   atorvastatin   80 mg Oral QHS   budesonide -glycopyrrolate -formoterol   2 puff Inhalation BID   Chlorhexidine  Gluconate Cloth  6 each Topical Daily   docusate sodium   100 mg Oral BID   enoxaparin  (LOVENOX ) injection  30 mg Subcutaneous Q24H   ezetimibe   10 mg Oral Daily   feeding supplement  1 Container Oral TID BM   metoprolol  tartrate  12.5 mg Oral BID   sertraline   150 mg Oral Daily   tamsulosin   0.4 mg Oral  Daily   traZODone   100 mg Oral QHS    Continuous Infusions:    PRN Medications:  albuterol , bisacodyl , methocarbamol  **OR** methocarbamol  (ROBAXIN ) injection, metoCLOPramide  **OR** metoCLOPramide  (REGLAN ) injection, ondansetron  **OR** ondansetron  (ZOFRAN ) IV, oxyCODONE , oxyCODONE , senna-docusate, sodium phosphate, traMADol   Antimicrobials from admission:  Anti-infectives (From admission, onward)    Start     Dose/Rate Route Frequency Ordered Stop   12/12/24 2200  ceFAZolin  (ANCEF ) IVPB 1 g/50 mL premix        1 g 100 mL/hr over 30 Minutes Intravenous Every 6 hours 12/12/24 2100 12/13/24 1244   12/12/24 1600  ceFAZolin  (ANCEF ) IVPB 2g/100 mL premix        2 g 200 mL/hr over 30 Minutes Intravenous On call to O.R. 12/12/24 1307 12/12/24 1657           Data Reviewed:  I have personally reviewed the following...  CBC: Recent Labs  Lab 12/12/24 1111 12/13/24 0349 12/14/24 0433 12/14/24 1139 12/15/24 0555 12/16/24 0600 12/17/24 0633  WBC 4.5 7.2 5.5  --  7.6 7.1 5.1  NEUTROABS 3.2  --   --   --   --   --   --   HGB 10.7* 8.5* 6.4* 8.9* 8.0* 7.4* 7.3*  HCT 33.9* 26.9* 20.3* 27.3* 24.8* 22.8* 22.5*  MCV 93.9 96.1 95.8  --  89.9 89.4 89.6  PLT 60* 52* 41*  --  39* 53* 70*   Basic Metabolic Panel: Recent Labs   Lab 12/13/24 0349 12/14/24 0433 12/15/24 0555 12/16/24 0600 12/17/24 0633  NA 139 136 137 137 139  K 4.5 3.8 3.8 4.0 4.3  CL 107 104 106 106 107  CO2 24 24 21* 21* 23  GLUCOSE 122* 123* 116* 103* 91  BUN 29* 36* 38* 41* 45*  CREATININE 1.93* 2.50* 2.61* 2.30* 2.34*  CALCIUM  7.9* 7.9* 8.3* 8.5* 8.7*   GFR: Estimated Creatinine Clearance: 19.3 mL/min (A) (by C-G formula based on SCr of 2.34 mg/dL (H)). Liver Function Tests: No results for input(s): AST, ALT, ALKPHOS, BILITOT, PROT, ALBUMIN in the last 168 hours. No results for input(s): LIPASE, AMYLASE in the last 168 hours. No results for input(s): AMMONIA in the last 168 hours. Coagulation Profile: Recent Labs  Lab 12/12/24 1111  INR 1.2   Cardiac Enzymes: No results for input(s): CKTOTAL, CKMB, CKMBINDEX, TROPONINI in the last 168 hours. BNP (last 3 results) No results for input(s): PROBNP in the last 8760 hours. HbA1C: No results for input(s): HGBA1C in the last 72 hours. CBG: No results for input(s): GLUCAP in the last 168 hours. Lipid Profile: No results for input(s): CHOL, HDL, LDLCALC, TRIG, CHOLHDL, LDLDIRECT in the last 72 hours. Thyroid  Function Tests: No results for input(s): TSH, T4TOTAL, FREET4, T3FREE, THYROIDAB in the last 72 hours. Anemia Panel: No results for input(s): VITAMINB12, FOLATE, FERRITIN, TIBC, IRON, RETICCTPCT in the last 72 hours. Most Recent Urinalysis On File:     Component Value Date/Time   COLORURINE STRAW (A) 01/04/2024 2154   APPEARANCEUR CLEAR (A) 01/04/2024 2154   APPEARANCEUR Clear 02/01/2023 1315   LABSPEC 1.005 01/04/2024 2154   PHURINE 6.0 01/04/2024 2154   GLUCOSEU 150 (A) 01/04/2024 2154   HGBUR SMALL (A) 01/04/2024 2154   BILIRUBINUR NEGATIVE 01/04/2024 2154   BILIRUBINUR Negative 02/01/2023 1315   KETONESUR NEGATIVE 01/04/2024 2154   PROTEINUR 30 (A) 01/04/2024 2154   NITRITE NEGATIVE 01/04/2024 2154    LEUKOCYTESUR TRACE (A) 01/04/2024 2154   Sepsis Labs: @LABRCNTIP (procalcitonin:4,lacticidven:4) Microbiology: Recent Results (from the  past 240 hours)  Surgical PCR screen     Status: None   Collection Time: 12/12/24  2:45 PM   Specimen: Nasal Mucosa; Nasal Swab  Result Value Ref Range Status   MRSA, PCR NEGATIVE NEGATIVE Final   Staphylococcus aureus NEGATIVE NEGATIVE Final    Comment: (NOTE) The Xpert SA Assay (FDA approved for NASAL specimens in patients 27 years of age and older), is one component of a comprehensive surveillance program. It is not intended to diagnose infection nor to guide or monitor treatment. Performed at [facility], [address], [place], [place] [address]       Radiology Studies last 3 days: No results found.        Burle Kwan, DO [facility] 12/17/2024, 2:29 PM    Dictation software may have been used to generate the above note. Typos may occur and escape review in typed/dictated notes. Please contact Dr Marsa directly for clarity if needed.  Staff may message me via secure chat in Epic  but this may not receive an immediate response,  please page me for urgent matters!  If 7PM-7AM, please contact night coverage [email or web address]       "

## 2024-12-17 NOTE — Progress Notes (Signed)
 Physical Therapy Treatment Patient Details Name: Mario Patrick MRN: [id number] DOB: July 10, 1941 Today's Date: 12/17/2024   History of Present Illness presented to ER secondary to mechanical fall with acute onset of R hip pain; admitted for management of intertrochanteric hip fracture, s/p IM nailing (1/21), WBAT    PT Comments  Pt needing to have BM upon arrival.  Mod a x 2 to EOB  with Stedy lift used to transfer on/off BSC then to recliner at bedside.  Pt with less L lean today in sitting and in Stedy lift but will continue to benefit from +2 assist as R should injury - chronic - limits his ability to assist with R UE.  Overall tolerates Stedy well.     If plan is discharge home, recommend the following: Two people to help with walking and/or transfers;Two people to help with bathing/dressing/bathroom;Help with stairs or ramp for entrance;Assistance with cooking/housework;Assist for transportation   Can travel by private vehicle        Equipment Recommendations  None recommended by PT    Recommendations for Other Services       Precautions / Restrictions Precautions Precautions: Fall Recall of Precautions/Restrictions: Impaired Restrictions Weight Bearing Restrictions Per Provider Order: Yes RLE Weight Bearing Per Provider Order: Weight bearing as tolerated LLE Weight Bearing Per Provider Order: Weight bearing as tolerated     Mobility  Bed Mobility Overal bed mobility: Needs Assistance Bed Mobility: Supine to Sit     Supine to sit: +2 for physical assistance, HOB elevated       Patient Response: Cooperative  Transfers Overall transfer level: Needs assistance   Transfers: Sit to/from Stand, Bed to chair/wheelchair/BSC Sit to Stand: Min assist, +2 physical assistance, Via lift equipment           General transfer comment: stedy lift on and off commode then to recliner for breakfast    Ambulation/Gait               General Gait Details:  unable   Stairs             Wheelchair Mobility     Tilt Bed Tilt Bed Patient Response: Cooperative  Modified Rankin (Stroke Patients Only)       Balance Overall balance assessment: Needs assistance Sitting-balance support: Feet supported Sitting balance-Leahy Scale: Fair     Standing balance support: Bilateral upper extremity supported, During functional activity, Reliant on assistive device for balance Standing balance-Leahy Scale: Poor                              Communication Communication Communication: Impaired Factors Affecting Communication: Hearing impaired;Reduced clarity of speech;Difficulty expressing self  Cognition Arousal: Alert Behavior During Therapy: WFL for tasks assessed/performed   PT - Cognitive impairments: No apparent impairments                         Following commands: Impaired Following commands impaired: Follows one step commands inconsistently, Follows one step commands with increased time    Cueing Cueing Techniques: Verbal cues, Gestural cues, Tactile cues  Exercises Other Exercises Other Exercises: medium soft BM on BSC - dependant care    General Comments        Pertinent Vitals/Pain Pain Assessment Pain Assessment: Faces Faces Pain Scale: Hurts even more Pain Location: L hip Pain Descriptors / Indicators: Grimacing Pain Intervention(s): Limited activity within patient's tolerance, Monitored during session, Repositioned  Home Living                          Prior Function            PT Goals (current goals can now be found in the care plan section) Progress towards PT goals: Progressing toward goals    Frequency    Min 3X/week      PT Plan      Co-evaluation              AM-PAC PT 6 Clicks Mobility   Outcome Measure  Help needed turning from your back to your side while in a flat bed without using bedrails?: A Lot Help needed moving from lying on your  back to sitting on the side of a flat bed without using bedrails?: A Lot Help needed moving to and from a bed to a chair (including a wheelchair)?: A Lot Help needed standing up from a chair using your arms (e.g., wheelchair or bedside chair)?: A Lot Help needed to walk in hospital room?: Total Help needed climbing 3-5 steps with a railing? : Total 6 Click Score: 10    End of Session Equipment Utilized During Treatment: Gait belt Activity Tolerance: Patient tolerated treatment well;Patient limited by pain Patient left: in chair;with call bell/phone within reach;with chair alarm set Nurse Communication: Mobility status PT Visit Diagnosis: Unsteadiness on feet (R26.81);Other abnormalities of gait and mobility (R26.89);Difficulty in walking, not elsewhere classified (R26.2);Pain Pain - Right/Left: Left Pain - part of body: Hip     Time: 0922-0938 PT Time Calculation (min) (ACUTE ONLY): 16 min  Charges:    $Therapeutic Activity: 8-22 mins PT General Charges $$ ACUTE PT VISIT: 1 Visit                   Lauraine Gills, PTA 12/17/24, 9:47 AM

## 2024-12-17 NOTE — Plan of Care (Signed)
   Problem: Activity: Goal: Risk for activity intolerance will decrease Outcome: Progressing   Problem: Nutrition: Goal: Adequate nutrition will be maintained Outcome: Progressing   Problem: Elimination: Goal: Will not experience complications related to bowel motility Outcome: Progressing   Problem: Safety: Goal: Ability to remain free from injury will improve Outcome: Progressing

## 2024-12-18 DIAGNOSIS — S72001A Fracture of unspecified part of neck of right femur, initial encounter for closed fracture: Secondary | ICD-10-CM | POA: Diagnosis not present

## 2024-12-18 LAB — BPAM RBC
Blood Product Expiration Date: 202602202359
ISSUE DATE / TIME: 202601261801
Unit Type and Rh: 5100

## 2024-12-18 LAB — CBC
HCT: 26 % — ABNORMAL LOW (ref 39.0–52.0)
Hemoglobin: 8.3 g/dL — ABNORMAL LOW (ref 13.0–17.0)
MCH: 29.5 pg (ref 26.0–34.0)
MCHC: 31.9 g/dL (ref 30.0–36.0)
MCV: 92.5 fL (ref 80.0–100.0)
Platelets: 86 10*3/uL — ABNORMAL LOW (ref 150–400)
RBC: 2.81 MIL/uL — ABNORMAL LOW (ref 4.22–5.81)
RDW: 15.2 % (ref 11.5–15.5)
WBC: 5.4 10*3/uL (ref 4.0–10.5)
nRBC: 0 % (ref 0.0–0.2)

## 2024-12-18 LAB — TYPE AND SCREEN
ABO/RH(D): O POS
Antibody Screen: NEGATIVE
Unit division: 0

## 2024-12-18 LAB — BASIC METABOLIC PANEL WITH GFR
Anion gap: 11 (ref 5–15)
BUN: 51 mg/dL — ABNORMAL HIGH (ref 8–23)
CO2: 20 mmol/L — ABNORMAL LOW (ref 22–32)
Calcium: 8.4 mg/dL — ABNORMAL LOW (ref 8.9–10.3)
Chloride: 108 mmol/L (ref 98–111)
Creatinine, Ser: 2.31 mg/dL — ABNORMAL HIGH (ref 0.61–1.24)
GFR, Estimated: 28 mL/min — ABNORMAL LOW
Glucose, Bld: 110 mg/dL — ABNORMAL HIGH (ref 70–99)
Potassium: 4.7 mmol/L (ref 3.5–5.1)
Sodium: 138 mmol/L (ref 135–145)

## 2024-12-18 NOTE — Plan of Care (Signed)
  Problem: Activity: Goal: Risk for activity intolerance will decrease Outcome: Progressing   Problem: Elimination: Goal: Will not experience complications related to bowel motility Outcome: Progressing   Problem: Pain Managment: Goal: General experience of comfort will improve and/or be controlled Outcome: Progressing   Problem: Safety: Goal: Ability to remain free from injury will improve Outcome: Progressing   Problem: Skin Integrity: Goal: Risk for impaired skin integrity will decrease Outcome: Progressing

## 2024-12-18 NOTE — Consult Note (Addendum)
 "                                                                                   Consultation Note Date: 12/18/2024   Patient Name: Mario Patrick  DOB: Jul 22, 1941  MRN: [id number]  Age / Sex: 84 y.o., male  PCP: Zachary Idelia LABOR, MD Referring Physician: Marsa Edelman, DO  Reason for Consultation: Establishing goals of care  HPI/Patient Profile:Mario Patrick is a 84 y.o. male with medical history significant of CAD status post CABG, HTN, chronic HFpEF, CKD stage IIIb, BPH, carotid stenosis, chronic thrombocytopenia, COPD, anxiety/depression, presented with mechanical fall and right hip pain.   Clinical Assessment and Goals of Care: Notes and labs reviewed. HGB stable. CKD. Patient with left IMN on 1/21 after a fall.  Into see patient.  He is currently sitting in bedside chair at this time.  He states he lives alone at home.  He states he has a son who has moved away.  He discusses that he lived in [place] and has been in [place]  for about 24 years.  He discusses somewhat poor p.o. intake at baseline.  He states there is some pain present when he is trying to shift in his chair.  Patient answering in one-word in 2 word responses and is not elaborating on answers.  He does not appear to want to engage in conversation.  PMT will reattempt tomorrow.  MAR reviewed.  Patient has received oxycodone  1 dose today, and 1 dose yesterday.  He has received a dose of Reglan  today.  Would recommend assessing functional pain and medicating as needed.  Reviewed with nursing.  SUMMARY OF RECOMMENDATIONS   Would recommend assessing functional pain and treating as needed.  PMT will follow-up tomorrow.      Primary Diagnoses: Present on Admission:  Hip fracture (HCC)   I have reviewed the medical record, interviewed the patient and family, and examined the patient. The following aspects are pertinent.  Past Medical History:  Diagnosis Date   Anginal pain     Arthritis    Back ache    CHF (congestive heart failure) (HCC)    Chronic kidney disease    stage 3   COPD (chronic obstructive pulmonary disease) (HCC)    Coronary artery disease    recently checked carotid arteries, significant blockage   Depression    Dyspnea    Hypercholesteremia    Hypertension    Myocardial infarction [facility])    Social History   Socioeconomic History   Marital status: Widowed    Spouse name: Not on file   Number of children: Not on file   Years of education: Not on file   Highest education level: Not on file  Occupational History   Not on file  Tobacco Use   Smoking status: Former   Smokeless tobacco: Never  Vaping Use   Vaping status: Never Used  Substance and Sexual Activity   Alcohol use: No   Drug use: Never   Sexual activity: Not on file  Other Topics Concern   Not on file  Social History Narrative   Lives with house mate.   Social Drivers of  Health   Tobacco Use: Medium Risk (12/12/2024)   Patient History    Smoking Tobacco Use: Former    Smokeless Tobacco Use: Never    Passive Exposure: Not on file  Financial Resource Strain: Low Risk  (11/07/2024)   Received from [facility]   Overall Financial Resource Strain (CARDIA)    Difficulty of Paying Living Expenses: Not hard at all  Food Insecurity: No Food Insecurity (12/12/2024)   Epic    Worried About Radiation Protection Practitioner of Food in the Last Year: Never true    Ran Out of Food in the Last Year: Never true  Transportation Needs: No Transportation Needs (12/12/2024)   Epic    Lack of Transportation (Medical): No    Lack of Transportation (Non-Medical): No  Physical Activity: Not on file  Stress: Not on file  Social Connections: Moderately Integrated (12/13/2024)   Social Connection and Isolation Panel    Frequency of Communication with Friends and Family: Three times a week    Frequency of Social Gatherings with Friends and Family: Three times a week    Attends Religious  Services: More than 4 times per year    Active Member of Clubs or Organizations: Yes    Attends Banker Meetings: More than 4 times per year    Marital Status: Widowed  Depression (PHQ2-9): Not on file  Alcohol Screen: Not on file  Housing: Low Risk (12/12/2024)   Epic    Unable to Pay for Housing in the Last Year: No    Number of Times Moved in the Last Year: 0    Homeless in the Last Year: No  Utilities: Not At Risk (12/12/2024)   Epic    Threatened with loss of utilities: No  Health Literacy: Not on file   Family History  Problem Relation Age of Onset   Lung disease Mother    Cancer Father    Scheduled Meds:  acetaminophen   1,000 mg Oral Q8H   atorvastatin   80 mg Oral QHS   budesonide -glycopyrrolate -formoterol   2 puff Inhalation BID   Chlorhexidine  Gluconate Cloth  6 each Topical Daily   docusate sodium   100 mg Oral BID   enoxaparin  (LOVENOX ) injection  30 mg Subcutaneous Q24H   ezetimibe   10 mg Oral Daily   feeding supplement  1 Container Oral TID BM   metoprolol  tartrate  12.5 mg Oral BID   sertraline   150 mg Oral Daily   tamsulosin   0.4 mg Oral Daily   traZODone   100 mg Oral QHS   Continuous Infusions: PRN Meds:.albuterol , bisacodyl , methocarbamol  **OR** methocarbamol  (ROBAXIN ) injection, metoCLOPramide  **OR** metoCLOPramide  (REGLAN ) injection, ondansetron  **OR** ondansetron  (ZOFRAN ) IV, oxyCODONE , oxyCODONE , senna-docusate, sodium phosphate, traMADol  Medications Prior to Admission:  Prior to Admission medications  Medication Sig Start Date End Date Taking? Authorizing Provider  acetaminophen  (TYLENOL ) 500 MG tablet Take 500 mg by mouth every 6 (six) hours as needed for moderate pain.   Yes [provider]  albuterol  (VENTOLIN  HFA) 108 (90 Base) MCG/ACT inhaler Inhale 1-2 puffs into the lungs every 6 (six) hours as needed for wheezing or shortness of breath. Patient taking differently: Inhale 2 puffs into the lungs every 6 (six) hours as needed for  wheezing or shortness of breath. 03/06/21  Yes Gwenn Kent, MD  amLODipine (NORVASC) 2.5 MG tablet Take 2.5 mg by mouth daily.   Yes [provider]  atorvastatin  (LIPITOR ) 80 MG tablet Take 1 tablet by mouth at bedtime. 12/22/22 12/12/24 Yes [provider]  desonide (DESOWEN) 0.05 % ointment Apply 1 Application topically 2 (two) times daily. 11/07/24 11/07/25 Yes [provider]  enoxaparin  (LOVENOX ) 40 MG/0.4ML injection Inject 0.4 mLs (40 mg total) into the skin daily. 12/13/24 01/10/25 Yes Verlinda Boas, PA-C  ezetimibe  (ZETIA ) 10 MG tablet Take 1 tablet (10 mg total) by mouth daily. 10/01/20  Yes Pahwani, Velna SAUNDERS, MD  Fluticasone -Salmeterol (ADVAIR) 250-50 MCG/DOSE AEPB Inhale 1 puff into the lungs 2 (two) times daily. Patient taking differently: Inhale 1 puff into the lungs 2 (two) times daily as needed (shortness of breath). 10/01/20  Yes Pahwani, Rinka R, MD  lisinopril  (ZESTRIL ) 10 MG tablet Take 10 mg by mouth daily. 01/15/23  Yes [provider]  metoprolol  succinate (TOPROL -XL) 25 MG 24 hr tablet Take 12.5 mg by mouth daily. 08/28/21 12/12/24 Yes [provider]  mirtazapine  (REMERON ) 7.5 MG tablet Take 7.5 mg by mouth at bedtime.   Yes [provider]  nitroGLYCERIN  (NITROSTAT ) 0.4 MG SL tablet Place 0.4 mg under the tongue every 5 (five) minutes as needed for chest pain.   Yes [provider]  nystatin (MYCOSTATIN/NYSTOP) powder Apply 1 Application topically 2 (two) times daily. 11/07/24 11/07/25 Yes [provider]  sertraline  (ZOLOFT ) 100 MG tablet Take 1 tablet (100 mg total) by mouth daily. Patient taking differently: Take 150 mg by mouth daily. 10/01/20  Yes Pahwani, Velna SAUNDERS, MD  SPIRIVA  HANDIHALER 18 MCG inhalation capsule INHALE THE CONTENTS OF 1 CAPSULE VIA INHALATION DEVICE EVERY DAY Patient taking differently: Place 18 mcg into inhaler and inhale daily as needed (shortness of breath). 12/03/20  Yes Antonetta Rollene BRAVO, MD  tamsulosin  (FLOMAX ) 0.4 MG CAPS capsule Take 1 capsule by mouth daily.   Yes [provider]  traZODone  (DESYREL ) 100 MG tablet Take 100 mg by mouth at bedtime. 11/01/22  Yes [provider]  finasteride  (PROSCAR ) 5 MG tablet Take 1 tablet (5 mg total) by mouth daily. Patient not taking: Reported on 12/12/2024 11/02/22   Penne Knee, MD  fluticasone  (FLONASE ) 50 MCG/ACT nasal spray SHAKE LIQUID AND USE 2 SPRAYS IN EACH NOSTRIL EVERY DAY Patient not taking: Reported on 12/12/2024 04/20/22   [provider]  ipratropium-albuterol  (DUONEB) 0.5-2.5 (3) MG/3ML SOLN Take 3 mLs by nebulization every 6 (six) hours as needed. Patient not taking: Reported on 01/04/2024 10/01/20   Pahwani, Velna SAUNDERS, MD  JARDIANCE 10 MG TABS tablet Take by mouth. Patient not taking: Reported on 12/12/2024 03/12/21   [provider]  meclizine  (ANTIVERT ) 25 MG tablet Take 1 tablet (25 mg total) by mouth 3 (three) times daily as needed for dizziness. Patient not taking: Reported on 01/04/2024 10/01/20   Vernon Velna SAUNDERS, MD  methocarbamol  (ROBAXIN ) 500 MG tablet Take by mouth daily. As needed Patient not taking: Reported on 12/12/2024    [provider]  OVER THE COUNTER MEDICATION Apply 1 application topically daily as needed (pain). Cbd cream    [provider]  oxyCODONE  (OXY IR/ROXICODONE ) 5 MG immediate release tablet Take 0.5-1 tablets (2.5-5 mg total) by mouth every 4 (four) hours as needed for moderate pain (pain score 4-6) (pain score 4-6). 12/13/24   Verlinda Boas, PA-C  traMADol  (ULTRAM ) 50 MG tablet Take 1 tablet (50 mg total) by mouth every 6 (six) hours as needed (moderate pain, other narcotics cause side effects). 12/13/24   Verlinda Boas, PA-C   Allergies[1] Review of Systems  Musculoskeletal:        Left hip pain with moving  Physical Exam Pulmonary:     Effort: Pulmonary effort is normal.  Neurological:     Mental Status: He is alert.      Vital Signs: BP (!) 157/60 (BP Location: Left Arm)   Pulse 70   Temp 98.5 F (36.9 C)   Resp 16   Ht 5' 7 (1.702 m)   Wt 56.2 kg   SpO2 97%   BMI 19.42 kg/m  Pain Scale: 0-10 POSS *See Group Information*: S-Acceptable,Sleep, easy to arouse Pain Score: 5    SpO2: SpO2: 97 % O2 Device:SpO2: 97 % O2 Flow Rate: .O2 Flow Rate (L/min): 3 L/min  IO: Intake/output summary:  Intake/Output Summary (Last 24 hours) at 12/18/2024 1544 Last data filed at 12/18/2024 0900 Gross per 24 hour  Intake 365.42 ml  Output 250 ml  Net 115.42 ml    LBM: Last BM Date : 12/17/24 Baseline Weight: Weight: 56.7 kg Most recent weight: Weight: 56.2 kg      Time In: 12:20 Time Out: 1:00 Time Total: 40 min Greater than 50%  of this time was spent counseling and coordinating care related to the above assessment and plan.  Signed by: Camelia Lewis, NP   Please contact Palliative Medicine Team phone at [phone] for questions and concerns.  For individual provider: See Amion                 [1]  Allergies Allergen Reactions   Contrast Media  [Iodinated Contrast Media] Nausea Only    GI upset    Aspirin  Other (See Comments)    Thrombocytopenia (PER PT CAN TAKE LOW DOSE ASPIRIN )   "

## 2024-12-18 NOTE — Progress Notes (Signed)
 Physical Therapy Treatment Patient Details Name: Kaire Stary Que MRN: [id number] DOB: 05/01/1941 Today's Date: 12/18/2024   History of Present Illness presented to ER secondary to mechanical fall with acute onset of R hip pain; admitted for management of intertrochanteric hip fracture, s/p IM nailing (1/21), WBAT    PT Comments  Pt ready for session.  Co-tx with OT for improved outcomes.  Participated in exercises as described below. OT arrived to assist with mobility.  He is able to get to EOB with mod a x 2.  Stood x 2 to RW with min a x 2 and good ability to power up today.  While standing he participates in pre-gait activities but is unable to take any functional steps for transfer.  Herby is used again to transfer to recliner for pt safety and comfort.  OT remained  in room for further interventions after session,     If plan is discharge home, recommend the following: Two people to help with walking and/or transfers;Two people to help with bathing/dressing/bathroom;Help with stairs or ramp for entrance;Assistance with cooking/housework;Assist for transportation   Can travel by private vehicle        Equipment Recommendations  None recommended by PT    Recommendations for Other Services       Precautions / Restrictions Precautions Precautions: Fall Recall of Precautions/Restrictions: Impaired Restrictions Weight Bearing Restrictions Per Provider Order: Yes RLE Weight Bearing Per Provider Order: Weight bearing as tolerated LLE Weight Bearing Per Provider Order: Weight bearing as tolerated     Mobility  Bed Mobility Overal bed mobility: Needs Assistance Bed Mobility: Supine to Sit     Supine to sit: +2 for physical assistance, HOB elevated, Mod assist       Patient Response: Cooperative  Transfers Overall transfer level: Needs assistance Equipment used: Rolling walker (2 wheels) Transfers: Sit to/from Stand, Bed to chair/wheelchair/BSC Sit to Stand: Min assist,  +2 physical assistance           General transfer comment: able to stand x 2 with RW today and overall improved but unable to take any functional steps to transfer so Stedy used to chair for pt and staff safety and comfort Transfer via Lift Equipment: Stedy  Ambulation/Gait               General Gait Details: unable   Stairs             Wheelchair Mobility     Tilt Bed Tilt Bed Patient Response: Cooperative  Modified Rankin (Stroke Patients Only)       Balance Overall balance assessment: Needs assistance Sitting-balance support: Feet supported Sitting balance-Leahy Scale: Fair Sitting balance - Comments: supervision for sitting balance   Standing balance support: Bilateral upper extremity supported, During functional activity, Reliant on assistive device for balance Standing balance-Leahy Scale: Poor Standing balance comment: +2 with RW                            Communication Communication Communication: Impaired Factors Affecting Communication: Hearing impaired;Reduced clarity of speech;Difficulty expressing self  Cognition Arousal: Alert Behavior During Therapy: WFL for tasks assessed/performed   PT - Cognitive impairments: No apparent impairments                         Following commands: Impaired Following commands impaired: Follows one step commands inconsistently, Follows one step commands with increased time    Cueing Cueing Techniques: Verbal  cues, Gestural cues, Tactile cues  Exercises Other Exercises Other Exercises: supine AAROM BLE and standing AROM to tolerance LLE    General Comments        Pertinent Vitals/Pain Pain Assessment Pain Assessment: Faces Faces Pain Scale: Hurts even more Pain Location: L hip Pain Descriptors / Indicators: Grimacing, Aching Pain Intervention(s): Limited activity within patient's tolerance, Monitored during session, Repositioned    Home Living                           Prior Function            PT Goals (current goals can now be found in the care plan section) Progress towards PT goals: Progressing toward goals    Frequency    Min 3X/week      PT Plan      Co-evaluation PT/OT/SLP Co-Evaluation/Treatment: Yes Reason for Co-Treatment: For patient/therapist safety;To address functional/ADL transfers PT goals addressed during session: Mobility/safety with mobility OT goals addressed during session: ADL's and self-care      AM-PAC PT 6 Clicks Mobility   Outcome Measure  Help needed turning from your back to your side while in a flat bed without using bedrails?: A Lot Help needed moving from lying on your back to sitting on the side of a flat bed without using bedrails?: A Lot Help needed moving to and from a bed to a chair (including a wheelchair)?: A Lot Help needed standing up from a chair using your arms (e.g., wheelchair or bedside chair)?: A Lot Help needed to walk in hospital room?: Total Help needed climbing 3-5 steps with a railing? : Total 6 Click Score: 10    End of Session Equipment Utilized During Treatment: Gait belt Activity Tolerance: Patient tolerated treatment well;Patient limited by pain Patient left: in chair;with call bell/phone within reach;with chair alarm set Nurse Communication: Mobility status PT Visit Diagnosis: Unsteadiness on feet (R26.81);Other abnormalities of gait and mobility (R26.89);Difficulty in walking, not elsewhere classified (R26.2);Pain Pain - Right/Left: Left Pain - part of body: Hip     Time: 9141-9086 PT Time Calculation (min) (ACUTE ONLY): 15 min  Charges:    $Therapeutic Activity: 8-22 mins PT General Charges $$ ACUTE PT VISIT: 1 Visit                   Lauraine Gills, PTA 12/18/24, 10:13 AM'

## 2024-12-18 NOTE — Plan of Care (Signed)
" °  Problem: Education: Goal: Knowledge of General Education information will improve Description: Including pain rating scale, medication(s)/side effects and non-pharmacologic comfort measures Outcome: Progressing   Problem: Clinical Measurements: Goal: Ability to maintain clinical measurements within normal limits will improve Outcome: Progressing   Problem: Clinical Measurements: Goal: Diagnostic test results will improve Outcome: Progressing   Problem: Nutrition: Goal: Adequate nutrition will be maintained Outcome: Progressing   Problem: Clinical Measurements: Goal: Respiratory complications will improve Outcome: Not Progressing   "

## 2024-12-18 NOTE — Progress Notes (Signed)
 After administration of 2200 medication pt seemed to aspirate. Pt eventually able to cough up secretions, supplemental oxygen administered, pt suctioned, and nebulizer tx given to support oxygen status. Pt currently lying in bed HOB 30 degrees, pulse ox above 90% on 3L of 02.  Recommend speech consult, will pass on in day shift report.   Honeycomb dressing changed due to moderate drainage, incision clean dry and intact.

## 2024-12-18 NOTE — Progress Notes (Signed)
 " PROGRESS NOTE    Mario Patrick   FMW:[id number] DOB: 06/12/1941  DOA: 12/12/2024 Date of Service: 12/18/24 which is hospital day 6  PCP: Zachary Idelia LABOR, MD    Mario Patrick is a 84 y.o. male with medical history significant of CAD status post CABG, HTN, chronic HFpEF, CKD stage IIIb, BPH, carotid stenosis, chronic thrombocytopenia, COPD, anxiety/depression, presented with mechanical fall and right hip pain.  HPI: Patient has a chronic ambulatory impairment using roller walker to ambulate, on the morning 01/21, he was using a vacuum to clean his floor and lost his footing with a walker and fell on his right side hip, denies any lightheadedness chest pain shortness of breath before or during the episode, no head or neck injury, no LOC.  At baseline he uses roller walker to ambulate.    Hospital course / significant events:  01/21: ED Course: Afebrile, nontachycardic blood pressure 180/80 O2 saturation 97% on room air.  Hip x-ray showed intertrochanteric fracture of right hip, blood work showed creatinine 1.7 compared to baseline 2.0-2.7 BUN 26 hemoglobin 10.7 WBC 4.5 platelet 60. Calculated revised cardiac risk for primary operation low risk is at 10% significantly increased, cardiology consulted for preop clearance. Pt to OR today, underwent intramedullary nailing of Left femur  01/22: doing well postop, pending SNF placement  01/23: Hgb 6.4 early am --> 1 unit PRBC ordered. Repeat CBC pending. BP soft, systolic 80s. Avoid opiates pending improvement in BP, added midodrine  and hold lisinopril  (kept beta blocker low dose d/t afib hx) 01/24: BP improved systolic 110s. Hgb holding at 8.0. Cr 2.6, added 1L LR infuse slowly today 01/25: Cr 2.3, Hgb to 7.4 likely some hemo dilutional component, transfuse if <7 01/26: review nephrology notes, he has Dx CKD4 and fluctuating Cr/GFR but appears to be close to baseline now, will need close follow up. DC midodrine  today. Pt much more alert.  Pending SNF placement.  01/27: palliative consult per family request - pt alert but not very talkative, palliative to attempt again tomorrow, I will also ask them to speak to daughter if pt is not more forthcoming tomorrow. SNF placement still pending.       Consultants:  Orthopedic surgery  Cardiology   Procedures/Surgeries: 12/12/24: Intramedullary nailing of Left femur - Dr GORMAN Blanch       ASSESSMENT & PLAN:   Right intertrochanteric femoral fracture Secondary to mechanical fall S/p Intramedullary nailing of Left femur with cephalomedullary device 12/12/24 PT/OT and plan to rehab  Follow-up with [facility] clinic orthopedics in 2 weeks for staple removal and x-rays of the left hip.  DVT Prophylaxis - Lovenox  and TED hose Weight-Bearing as tolerated to left leg   ABLA perioperatively  Hemodilution w/ IV fluids  Impaired hematopoesis w/ CKD4 Contributing to hypotension 1 unit PRBC 01/23 and 1 unit PRBC on 1/26--> improved   Monitor CBC  hold lovenox  if continued low HH or overt bleed but will continue for now d/t high risk DVT postop   Hypotension - improved Tx anemia as above  Hold home antihypertensive meds (keep beta blocker for rate control, low dose) for now but can restart if tolerating off midodrine  Tapering down midodrine  --> dc today   History of multivessel CAD status post CABG  Chronic HFpEF HTN HLD Euvolemic Continue home atorvastatin  80 mg daily and zetia  10 mg daily.  held home lisinopril  10 mg daily given anemia and lower BP but can try restarting this lower dose once off midodrine  (see below  also for CKD) Continue metoprolol  tartrate 12.5 mg twice daily for Afib, midodrine  and BP has improved   has not been on aspirin  due to allergy.    COPD Stable Nebs prn O2 prn    CKD stage IIIb/IV Euvolemic, AKI ruled OUT Reviewed most recent nephrology notes 07/2024 - CKDIV and baseline GFR 28, though more recent labs show Cr 1.9-2.0, GFR 33-34 Holding  lisinopril  --> can resume this once of fmidodrine + Jardiance to delay kidney disease progression per nephrology note  Monitor BMP      Borderline underweight based on BMI: Body mass index is 19.42 kg/m.[name] Significantly low or high BMI is associated with higher medical risk.  Underweight - under 18  overweight - 25 to 29 obese - 30 or more Class 1 obesity: BMI of 30.0 to 34 Class 2 obesity: BMI of 35.0 to 39 Class 3 obesity: BMI of 40.0 to 49 Super Morbid Obesity: BMI 50-59 Super-super Morbid Obesity: BMI 60+ Healthy nutrition and physical activity advised as adjunct to other disease management and risk reduction treatments    DVT prophylaxis: lovenox , TED hose  IV fluids: LE continuous IV fluids today for 1L Nutrition: regular diet Central lines / other devices: none  Code Status: DNR ACP documentation reviewed:  none on file in [facility]  TOC needs: SNf rehab placement  Medical barriers to dispo: anemia, low BP. Anticiapte medically stable tomorrow             Subjective / Brief ROS:  Patient remains alert today No complaints other than hip is uncomfortable but pain is controlled for the most part  Family Communication: call to daughter [name] 12/18/24 6:08 PM left HIPAA compliant voicemail     Objective Findings:  Vitals:   12/18/24 0528 12/18/24 0812 12/18/24 1133 12/18/24 1625  BP: (!) 157/68 (!) 157/60  (!) 155/56  Pulse: 67 66 70 61  Resp: 16 16  16   Temp: 98.5 F (36.9 C)   98.9 F (37.2 C)  TempSrc:      SpO2: 99% 100% 97% 98%  Weight:      Height:        Intake/Output Summary (Last 24 hours) at 12/18/2024 1808 Last data filed at 12/18/2024 0900 Gross per 24 hour  Intake 365.42 ml  Output 250 ml  Net 115.42 ml    Filed Weights   12/12/24 1317 12/12/24 1532  Weight: 56.7 kg 56.2 kg    Examination:  Physical Exam Constitutional:      General: He is not in acute distress. Cardiovascular:     Rate and Rhythm: Normal rate and regular  rhythm.  Pulmonary:     Effort: Pulmonary effort is normal.     Breath sounds: Normal breath sounds.  Skin:    General: Skin is warm and dry.  Neurological:     Mental Status: He is alert.          Scheduled Medications:   acetaminophen   1,000 mg Oral Q8H   atorvastatin   80 mg Oral QHS   budesonide -glycopyrrolate -formoterol   2 puff Inhalation BID   Chlorhexidine  Gluconate Cloth  6 each Topical Daily   docusate sodium   100 mg Oral BID   enoxaparin  (LOVENOX ) injection  30 mg Subcutaneous Q24H   ezetimibe   10 mg Oral Daily   feeding supplement  1 Container Oral TID BM   metoprolol  tartrate  12.5 mg Oral BID   sertraline   150 mg Oral Daily   tamsulosin   0.4 mg Oral Daily  traZODone   100 mg Oral QHS    Continuous Infusions:    PRN Medications:  albuterol , bisacodyl , methocarbamol  **OR** methocarbamol  (ROBAXIN ) injection, metoCLOPramide  **OR** metoCLOPramide  (REGLAN ) injection, ondansetron  **OR** ondansetron  (ZOFRAN ) IV, oxyCODONE , oxyCODONE , senna-docusate, sodium phosphate, traMADol   Antimicrobials from admission:  Anti-infectives (From admission, onward)    Start     Dose/Rate Route Frequency Ordered Stop   12/12/24 2200  ceFAZolin  (ANCEF ) IVPB 1 g/50 mL premix        1 g 100 mL/hr over 30 Minutes Intravenous Every 6 hours 12/12/24 2100 12/13/24 1244   12/12/24 1600  ceFAZolin  (ANCEF ) IVPB 2g/100 mL premix        2 g 200 mL/hr over 30 Minutes Intravenous On call to O.R. 12/12/24 1307 12/12/24 1657           Data Reviewed:  I have personally reviewed the following...  CBC: Recent Labs  Lab 12/12/24 1111 12/13/24 0349 12/14/24 0433 12/14/24 1139 12/15/24 0555 12/16/24 0600 12/17/24 0633 12/18/24 0455  WBC 4.5   < > 5.5  --  7.6 7.1 5.1 5.4  NEUTROABS 3.2  --   --   --   --   --   --   --   HGB 10.7*   < > 6.4* 8.9* 8.0* 7.4* 7.3* 8.3*  HCT 33.9*   < > 20.3* 27.3* 24.8* 22.8* 22.5* 26.0*  MCV 93.9   < > 95.8  --  89.9 89.4 89.6 92.5  PLT 60*   < >  41*  --  39* 53* 70* 86*   < > = values in this interval not displayed.   Basic Metabolic Panel: Recent Labs  Lab 12/14/24 0433 12/15/24 0555 12/16/24 0600 12/17/24 0633 12/18/24 0455  NA 136 137 137 139 138  K 3.8 3.8 4.0 4.3 4.7  CL 104 106 106 107 108  CO2 24 21* 21* 23 20*  GLUCOSE 123* 116* 103* 91 110*  BUN 36* 38* 41* 45* 51*  CREATININE 2.50* 2.61* 2.30* 2.34* 2.31*  CALCIUM  7.9* 8.3* 8.5* 8.7* 8.4*   GFR: Estimated Creatinine Clearance: 19.6 mL/min (A) (by C-G formula based on SCr of 2.31 mg/dL (H)). Liver Function Tests: No results for input(s): AST, ALT, ALKPHOS, BILITOT, PROT, ALBUMIN in the last 168 hours. No results for input(s): LIPASE, AMYLASE in the last 168 hours. No results for input(s): AMMONIA in the last 168 hours. Coagulation Profile: Recent Labs  Lab 12/12/24 1111  INR 1.2   Cardiac Enzymes: No results for input(s): CKTOTAL, CKMB, CKMBINDEX, TROPONINI in the last 168 hours. BNP (last 3 results) No results for input(s): PROBNP in the last 8760 hours. HbA1C: No results for input(s): HGBA1C in the last 72 hours. CBG: No results for input(s): GLUCAP in the last 168 hours. Lipid Profile: No results for input(s): CHOL, HDL, LDLCALC, TRIG, CHOLHDL, LDLDIRECT in the last 72 hours. Thyroid  Function Tests: No results for input(s): TSH, T4TOTAL, FREET4, T3FREE, THYROIDAB in the last 72 hours. Anemia Panel: No results for input(s): VITAMINB12, FOLATE, FERRITIN, TIBC, IRON, RETICCTPCT in the last 72 hours. Most Recent Urinalysis On File:     Component Value Date/Time   COLORURINE STRAW (A) 01/04/2024 2154   APPEARANCEUR CLEAR (A) 01/04/2024 2154   APPEARANCEUR Clear 02/01/2023 1315   LABSPEC 1.005 01/04/2024 2154   PHURINE 6.0 01/04/2024 2154   GLUCOSEU 150 (A) 01/04/2024 2154   HGBUR SMALL (A) 01/04/2024 2154   BILIRUBINUR NEGATIVE 01/04/2024 2154   BILIRUBINUR Negative 02/01/2023  1315   KETONESUR  NEGATIVE 01/04/2024 2154   PROTEINUR 30 (A) 01/04/2024 2154   NITRITE NEGATIVE 01/04/2024 2154   LEUKOCYTESUR TRACE (A) 01/04/2024 2154   Sepsis Labs: @LABRCNTIP (procalcitonin:4,lacticidven:4) Microbiology: Recent Results (from the past 240 hours)  Surgical PCR screen     Status: None   Collection Time: 12/12/24  2:45 PM   Specimen: Nasal Mucosa; Nasal Swab  Result Value Ref Range Status   MRSA, PCR NEGATIVE NEGATIVE Final   Staphylococcus aureus NEGATIVE NEGATIVE Final    Comment: (NOTE) The Xpert SA Assay (FDA approved for NASAL specimens in patients 67 years of age and older), is one component of a comprehensive surveillance program. It is not intended to diagnose infection nor to guide or monitor treatment. Performed at [facility], [address], [place], [place] [address]       Radiology Studies last 3 days: No results found.        Jymir Dunaj, DO [facility] 12/18/2024, 6:08 PM    Dictation software may have been used to generate the above note. Typos may occur and escape review in typed/dictated notes. Please contact Dr Marsa directly for clarity if needed.  Staff may message me via secure chat in Epic  but this may not receive an immediate response,  please page me for urgent matters!  If 7PM-7AM, please contact night coverage [email or web address]       "

## 2024-12-18 NOTE — Progress Notes (Signed)
 Occupational Therapy Treatment Patient Details Name: Mario Patrick MRN: [id number] DOB: 10/24/1941 Today's Date: 12/18/2024   History of present illness presented to ER secondary to mechanical fall with acute onset of L hip pain; admitted for management of intertrochanteric L hip fracture, s/p IM nailing (1/21), WBAT   OT comments  Mario Patrick was seen for OT/PT co-treatment to maximize safety and progress functional mobility this date. Upon arrival to room pt supine in bed, PTA already in to begin session, agreeable to OT joining Tx session. OT facilitated ADL management with education and assistance as described below. See ADL section for additional details regarding occupational performance. Pt continues to be functionally limited by increased pain with mobility, decreased activity tolerance, decreased LE ROM, and poor balance. Pt return verbalizes understanding of education provided t/o session. Pt is progressing toward OT goals and continues to benefit from skilled OT services to maximize return to PLOF and minimize risk of future falls, injury, and readmission. Will continue to follow POC as written. Discharge recommendation remains appropriate.        If plan is discharge home, recommend the following:  A lot of help with bathing/dressing/bathroom;Assistance with cooking/housework;Assist for transportation;Help with stairs or ramp for entrance;Two people to help with walking and/or transfers   Equipment Recommendations  BSC/3in1    Recommendations for Other Services      Precautions / Restrictions Precautions Precautions: Fall Recall of Precautions/Restrictions: Impaired Restrictions Weight Bearing Restrictions Per Provider Order: Yes RLE Weight Bearing Per Provider Order: Weight bearing as tolerated LLE Weight Bearing Per Provider Order: Weight bearing as tolerated       Mobility Bed Mobility Overal bed mobility: Needs Assistance Bed Mobility: Supine to Sit      Supine to sit: +2 for physical assistance, HOB elevated, Max assist Sit to supine: Total assist, +2 for physical assistance        Transfers Overall transfer level: Needs assistance Equipment used: Rolling walker (2 wheels) Transfers: Sit to/from Stand, Bed to chair/wheelchair/BSC Sit to Stand: Min assist, +2 physical assistance     Step pivot transfers: Mod assist, +2 safety/equipment, Max assist     General transfer comment: able to stand x 2 with RW today and overall improved but unable to take any functional steps to transfer so Stedy used to chair for pt and staff safety and comfort Transfer via Lift Equipment: Stedy   Balance Overall balance assessment: Needs assistance Sitting-balance support: Feet supported Sitting balance-Leahy Scale: Fair Sitting balance - Comments: supervision for sitting balance Postural control: Posterior lean Standing balance support: Bilateral upper extremity supported, During functional activity, Reliant on assistive device for balance Standing balance-Leahy Scale: Poor Standing balance comment: +2 with RW                           ADL either performed or assessed with clinical judgement   ADL Overall ADL's : Needs assistance/impaired     Grooming: Sitting;Set up;Supervision/safety Grooming Details (indicate cue type and reason): Performs UB grooming once seated up in chair.                 Toilet Transfer: +2 for physical assistance;Moderate assistance;Maximal assistance;Rolling walker (2 wheels) Toilet Transfer Details (indicate cue type and reason): Simulated to recliner, contiues to require cueing for safety/sequencing when using STS lift. Is able to take a few effortful side steps toward [facility], but is unable to safely weight shift to pivot without mechanical lift equipment.  Functional mobility during ADLs: +2 for physical assistance;Moderate assistance;Maximal assistance;Rolling walker (2 wheels) General ADL  Comments: Educated on energy conservation strategies t/o functional mobility with emphasis on PLB for improved safety and functional independence.    Extremity/Trunk Assessment              Vision Baseline Vision/History: 1 Wears glasses Ability to See in Adequate Light: 1 Impaired Patient Visual Report: No change from baseline     Perception     Praxis     Communication Communication Communication: Impaired Factors Affecting Communication: Hearing impaired;Reduced clarity of speech;Difficulty expressing self   Cognition Arousal: Alert Behavior During Therapy: WFL for tasks assessed/performed                                 Following commands: Impaired Following commands impaired: Follows one step commands inconsistently, Follows one step commands with increased time      Cueing   Cueing Techniques: Verbal cues, Gestural cues, Tactile cues  Exercises Other Exercises Other Exercises: OT facilitated ADL management with education and assist as described above. See ADL section for additional details.    Shoulder Instructions       General Comments      Pertinent Vitals/ Pain       Pain Assessment Pain Assessment: 0-10 Pain Score: 9  Pain Location: L hip Pain Descriptors / Indicators: Grimacing, Aching, Guarding Pain Intervention(s): Limited activity within patient's tolerance, Monitored during session, Repositioned  Home Living                                          Prior Functioning/Environment              Frequency  Min 2X/week        Progress Toward Goals  OT Goals(current goals can now be found in the care plan section)  Progress towards OT goals: Progressing toward goals  Acute Rehab OT Goals Patient Stated Goal: improve pain OT Goal Formulation: With patient Time For Goal Achievement: 12/28/24 Potential to Achieve Goals: Good  Plan      Co-evaluation    PT/OT/SLP Co-Evaluation/Treatment: Yes Reason  for Co-Treatment: For patient/therapist safety;To address functional/ADL transfers PT goals addressed during session: Mobility/safety with mobility OT goals addressed during session: ADL's and self-care      AM-PAC OT 6 Clicks Daily Activity     Outcome Measure   Help from another person eating meals?: A Little Help from another person taking care of personal grooming?: A Little Help from another person toileting, which includes using toliet, bedpan, or urinal?: A Lot Help from another person bathing (including washing, rinsing, drying)?: A Lot Help from another person to put on and taking off regular upper body clothing?: A Little Help from another person to put on and taking off regular lower body clothing?: A Lot 6 Click Score: 15    End of Session Equipment Utilized During Treatment: Rolling walker (2 wheels);Other (comment) (STEDY)  OT Visit Diagnosis: Unsteadiness on feet (R26.81);Repeated falls (R29.6);Muscle weakness (generalized) (M62.81);History of falling (Z91.81)   Activity Tolerance Patient tolerated treatment well   Patient Left in chair;with call bell/phone within reach;with chair alarm set;with nursing/sitter in room   Nurse Communication Mobility status        Time: 9098-9078 OT Time Calculation (min): 20 min  Charges: OT General  Charges $OT Visit: 1 Visit OT Treatments $Self Care/Home Management : 8-22 mins  Jhonny Pelton, M.S., OTR/L 12/18/24, 11:40 AM

## 2024-12-18 NOTE — TOC Progression Note (Addendum)
 Transition of Care [facility]) - Progression Note    Patient Details  Name: Mario Patrick MRN: [id number] Date of Birth: Feb 05, 1941  [facility] CM/SW Contact  Daved JONETTA Hamilton, RN Phone Number: 12/18/2024, 5:17 PM  Clinical Narrative:     Referral successfully faxed to Alm at [facility].  [facility] has not received a call back from [facility] or [facility]. TOC to reach back out tomorrow.  [facility]- still pending in the HUB  Expected Discharge Plan: Skilled Nursing Facility Barriers to Discharge: Continued Medical Work up (anemia/blood pressure)               Expected Discharge Plan and Services   Discharge Planning Services: CM Consult   Living arrangements for the past 2 months: Single Family Home                                       Social Drivers of Health (SDOH) Interventions SDOH Screenings   Food Insecurity: No Food Insecurity (12/12/2024)  Housing: Low Risk (12/12/2024)  Transportation Needs: No Transportation Needs (12/12/2024)  Utilities: Not At Risk (12/12/2024)  Financial Resource Strain: Low Risk  (11/07/2024)   Received from [facility]  Social Connections: Moderately Integrated (12/13/2024)  Tobacco Use: Medium Risk (12/12/2024)    Readmission Risk Interventions     No data to display

## 2024-12-19 DIAGNOSIS — S72002A Fracture of unspecified part of neck of left femur, initial encounter for closed fracture: Secondary | ICD-10-CM

## 2024-12-19 LAB — CBC
HCT: 29 % — ABNORMAL LOW (ref 39.0–52.0)
Hemoglobin: 9.2 g/dL — ABNORMAL LOW (ref 13.0–17.0)
MCH: 29.4 pg (ref 26.0–34.0)
MCHC: 31.7 g/dL (ref 30.0–36.0)
MCV: 92.7 fL (ref 80.0–100.0)
Platelets: 102 10*3/uL — ABNORMAL LOW (ref 150–400)
RBC: 3.13 MIL/uL — ABNORMAL LOW (ref 4.22–5.81)
RDW: 15.5 % (ref 11.5–15.5)
WBC: 4.2 10*3/uL (ref 4.0–10.5)
nRBC: 0.7 % — ABNORMAL HIGH (ref 0.0–0.2)

## 2024-12-19 LAB — BASIC METABOLIC PANEL WITH GFR
Anion gap: 10 (ref 5–15)
BUN: 47 mg/dL — ABNORMAL HIGH (ref 8–23)
CO2: 22 mmol/L (ref 22–32)
Calcium: 8.2 mg/dL — ABNORMAL LOW (ref 8.9–10.3)
Chloride: 107 mmol/L (ref 98–111)
Creatinine, Ser: 2.05 mg/dL — ABNORMAL HIGH (ref 0.61–1.24)
GFR, Estimated: 32 mL/min — ABNORMAL LOW
Glucose, Bld: 114 mg/dL — ABNORMAL HIGH (ref 70–99)
Potassium: 4 mmol/L (ref 3.5–5.1)
Sodium: 138 mmol/L (ref 135–145)

## 2024-12-19 MED ORDER — GABAPENTIN 300 MG PO CAPS: 300.0000 mg | ORAL_CAPSULE | Freq: Two times a day (BID) | ORAL | Status: DC

## 2024-12-19 MED ORDER — LISINOPRIL 10 MG PO TABS
10.0000 mg | ORAL_TABLET | Freq: Every day | ORAL | Status: DC
  Administered 2024-12-19 – 2024-12-21 (×3): 10 mg via ORAL
  Filled 2024-12-19 (×3): qty 1

## 2024-12-19 MED ORDER — GABAPENTIN 100 MG PO CAPS
200.0000 mg | ORAL_CAPSULE | Freq: Two times a day (BID) | ORAL | Status: DC
  Administered 2024-12-19 – 2024-12-21 (×4): 200 mg via ORAL
  Filled 2024-12-19 (×5): qty 2

## 2024-12-19 NOTE — TOC Progression Note (Addendum)
 Transition of Care [facility]) - Progression Note    Patient Details  Name: Mario Patrick MRN: [id number] Date of Birth: [birth year]  [facility]) CM/SW Contact  Daved JONETTA Hamilton, RN Phone Number: 12/19/2024, 11:37 AM  Clinical Narrative:     [facility]- [phone] Admissions Director Santana Medley, lvmm (2nd) requesting to return my call regarding referral sent in HUB.  UPDATE 11:45am-   Received call from Toribio at [facility], he advised this CM that they can offer a bed for the patient.   This CM spoke with patient's daughter [name], she verbalized acceptance of the bed offer.   This CM lvmm (SECURE) for Toribio at [place], bed offer accepted for shared room. Will reach back out once anticipated discharge date is known.   This CM sent Rodey to Attending to notify rehab bed secured and to inquire about anticipated medical readiness.    Expected Discharge Plan: Skilled Nursing Facility Barriers to Discharge: Continued Medical Work up (anemia/blood pressure)               Expected Discharge Plan and Services   Discharge Planning Services: CM Consult   Living arrangements for the past 2 months: Single Family Home                                       Social Drivers of Health (SDOH) Interventions SDOH Screenings   Food Insecurity: No Food Insecurity (12/12/2024)  Housing: Low Risk (12/12/2024)  Transportation Needs: No Transportation Needs (12/12/2024)  Utilities: Not At Risk (12/12/2024)  Financial Resource Strain: Low Risk  (11/07/2024)   Received from [facility]  Social Connections: Moderately Integrated (12/13/2024)  Tobacco Use: Medium Risk (12/12/2024)    Readmission Risk Interventions     No data to display

## 2024-12-19 NOTE — Plan of Care (Signed)
   Problem: Activity: Goal: Risk for activity intolerance will decrease Outcome: Progressing   Problem: Nutrition: Goal: Adequate nutrition will be maintained Outcome: Progressing   Problem: Elimination: Goal: Will not experience complications related to bowel motility Outcome: Progressing   Problem: Pain Managment: Goal: General experience of comfort will improve and/or be controlled Outcome: Progressing   Problem: Safety: Goal: Ability to remain free from injury will improve Outcome: Progressing   Problem: Skin Integrity: Goal: Risk for impaired skin integrity will decrease Outcome: Progressing

## 2024-12-19 NOTE — Progress Notes (Signed)
 "                                                                                                                                                                                                          Daily Progress Note   Patient Name: Mario Patrick       Date: 12/19/2024 DOB: [birth year]  Age: 84 y.o. MRN#: [id number] Attending Physician: Awanda City, MD Primary Care Physician: Zachary Idelia LABOR, MD Admit Date: 12/12/2024  Reason for Consultation/Follow-up: Establishing goals of care  Subjective: Notes and labs reviewed.  Hemoglobin is stable.  BUN is 47, creatinine is 2.05 today.   In to see patient.  PT/OT is at bedside and is working to get him back in bed.  Patient appears uncomfortable and states he has pain.  With discussion he states that it is burning pain in his postop leg.  Stepped out for  PT/OT to continue.  Primary and surgery team made aware of the above.  Length of Stay: 7  Current Medications: Scheduled Meds:   acetaminophen   1,000 mg Oral Q8H   atorvastatin   80 mg Oral QHS   budesonide -glycopyrrolate -formoterol   2 puff Inhalation BID   Chlorhexidine  Gluconate Cloth  6 each Topical Daily   docusate sodium   100 mg Oral BID   enoxaparin  (LOVENOX ) injection  30 mg Subcutaneous Q24H   ezetimibe   10 mg Oral Daily   feeding supplement  1 Container Oral TID BM   metoprolol  tartrate  12.5 mg Oral BID   sertraline   150 mg Oral Daily   tamsulosin   0.4 mg Oral Daily   traZODone   100 mg Oral QHS    Continuous Infusions:   PRN Meds: albuterol , bisacodyl , methocarbamol  **OR** methocarbamol  (ROBAXIN ) injection, metoCLOPramide  **OR** metoCLOPramide  (REGLAN ) injection, ondansetron  **OR** ondansetron  (ZOFRAN ) IV, oxyCODONE , oxyCODONE , senna-docusate, sodium phosphate, traMADol   Physical Exam Pulmonary:     Effort: Pulmonary effort is normal.  Skin:    General: Skin is warm and dry.     Comments: Honeycomb bandage to left outer thigh clean dry and intact.   Neurological:     Mental Status: He is alert.             Vital Signs: BP (!) 176/75 (BP Location: Left Arm)   Pulse 75   Temp 98.4 F (36.9 C)   Resp 17   Ht 5' 7 (1.702 m)   Wt 56.2 kg   SpO2 96%   BMI 19.42 kg/m  SpO2: SpO2: 96 % O2 Device: O2 Device: Room Air O2 Flow Rate: O2 Flow  Rate (L/min): 3 L/min  Intake/output summary:  Intake/Output Summary (Last 24 hours) at 12/19/2024 1451 Last data filed at 12/19/2024 0900 Gross per 24 hour  Intake 300 ml  Output 300 ml  Net 0 ml   LBM: Last BM Date : 12/17/24 Baseline Weight: Weight: 56.7 kg Most recent weight: Weight: 56.2 kg   Patient Active Problem List   Diagnosis Date Noted   Malnutrition of moderate degree 12/14/2024   Hip fracture, unspecified laterality, closed, initial encounter (HCC) 12/12/2024   Hip fracture (HCC) 12/12/2024   Acute on chronic congestive heart failure (HCC) 01/05/2024   Acute on chronic diastolic CHF (congestive heart failure) (HCC) 01/04/2024   COPD (chronic obstructive pulmonary disease) (HCC) 01/04/2024   CAD (coronary artery disease) 01/04/2024   Myocardial injury 01/04/2024   Chronic kidney disease, stage 3b (HCC) 01/04/2024   Type II diabetes mellitus with renal manifestations (HCC) 01/04/2024   Depression 01/04/2024   Thrombocytopenia 01/04/2024   GERD (gastroesophageal reflux disease) 01/04/2024   Carotid stenosis 02/11/2023   HTN (hypertension) 02/11/2023   Hypercholesteremia 02/11/2023   Hypotension 09/30/2020   Orthostatic hypotension 09/30/2020    Palliative Care Assessment & Plan   Recommendations/Plan: Would recommend initiating gabapentin  for patient's burning pain.  Code Status:    Code Status Orders  (From admission, onward)           Start     Ordered   12/12/24 1520  Do not attempt resuscitation (DNR)- Limited -Do Not Intubate (DNI)  (Code Status)  Continuous       Question Answer Comment  If pulseless and not breathing No CPR or chest compressions.    In Pre-Arrest Conditions (Patient Is Breathing and Has A Pulse) Do not intubate. Provide all appropriate non-invasive medical interventions. Avoid ICU transfer unless indicated or required.   Consent: Discussion documented in EHR or advanced directives reviewed      12/12/24 1519           Code Status History     Date Active Date Inactive Code Status Order ID Comments User Context   12/12/2024 1339 12/12/2024 1519 Full Code [id number]  Laurita Cort DASEN, MD ED   01/04/2024 1950 01/05/2024 2123 Full Code [id number]  Hilma Rankins, MD ED   07/16/2021 1617 07/16/2021 2134 Full Code [id number]  Kathlynn Sharper, MD Inpatient   09/30/2020 0105 10/01/2020 1809 Full Code [id number]  Mansy, Madison LABOR, MD ED   Thank you for allowing the Palliative Medicine Team to assist in the care of this patient.   Time In: 2:05 Time Out: 2:30 Total Time 25 min Prolonged Time Billed  no       Greater than 50%  of this time was spent counseling and coordinating care related to the above assessment and plan.  Camelia Lewis, NP  Please contact Palliative Medicine Team phone at [phone] for questions and concerns.       "

## 2024-12-19 NOTE — Progress Notes (Signed)
 PT Cancellation Note  Patient Details Name: Mario Patrick MRN: [id number] DOB: 1941/10/02   Cancelled Treatment:    Reason Eval/Treat Not Completed: Other (comment) Patient recently got back in bed with OT and in a lot of pain. Received pain meds and resting. Will re-attempt at later date/time.   Maryanne Finder, PT, DPT Physical Therapist - [facility]  [facility]   Escher Harr A Daryn Hicks 12/19/2024, 2:47 PM

## 2024-12-19 NOTE — Plan of Care (Signed)
" °  Problem: Clinical Measurements: Goal: Respiratory complications will improve Outcome: Progressing   Problem: Clinical Measurements: Goal: Cardiovascular complication will be avoided Outcome: Progressing   Problem: Activity: Goal: Risk for activity intolerance will decrease Outcome: Progressing   Problem: Nutrition: Goal: Adequate nutrition will be maintained Outcome: Progressing   Problem: Elimination: Goal: Will not experience complications related to urinary retention Outcome: Progressing   "

## 2024-12-19 NOTE — Progress Notes (Signed)
 Occupational Therapy Treatment Patient Details Name: Mario Patrick MRN: [id number] DOB: [birth year] Today's Date: 12/19/2024   History of present illness Pt is a 84 y.o. male presented to ER secondary to mechanical fall with acute onset of L hip pain; admitted for management of intertrochanteric L hip fracture, s/p IM nailing (1/21), WBAT. PMH significant for CAD status post CABG, HTN, chronic HFpEF, CKD stage IIIb, BPH, carotid stenosis, chronic thrombocytopenia, COPD, anxiety/depression   OT comments  Pt making progress towards goals, able to transfer from recliner > bed using RW and MOD A +2. Movements slow and effortful, cues for sequencing and UE reliance on RW. TOTAL A +1 to return to supine. RN notified of increased pain after transfer, and will provide pain meds. OT will follow acutely, discharge recommendation appropriate.       If plan is discharge home, recommend the following:  A lot of help with bathing/dressing/bathroom;Assistance with cooking/housework;Assist for transportation;Help with stairs or ramp for entrance;Two people to help with walking and/or transfers   Equipment Recommendations  BSC/3in1       Precautions / Restrictions Precautions Precautions: Fall Recall of Precautions/Restrictions: Impaired Restrictions Weight Bearing Restrictions Per Provider Order: Yes LLE Weight Bearing Per Provider Order: Weight bearing as tolerated       Mobility Bed Mobility Overal bed mobility: Needs Assistance Bed Mobility: Sit to Supine       Sit to supine: Total assist   General bed mobility comments: pt required MAX-TOTAL to transition to returning to supine Patient Response: Cooperative  Transfers Overall transfer level: Needs assistance Equipment used: Rolling walker (2 wheels) Transfers: Sit to/from Stand, Bed to chair/wheelchair/BSC Sit to Stand: Min assist, +2 physical assistance     Step pivot transfers: Mod assist, +2 physical assistance      General transfer comment: able to stand with MIN A +2, transfers from recliner > bed using RW, cues for UE reliance and pt able to bear weight through LLE, slow effortful movements with shuffled steps. takes 1 short standing break during transfer.     Balance Overall balance assessment: Needs assistance Sitting-balance support: Feet supported Sitting balance-Leahy Scale: Fair     Standing balance support: Bilateral upper extremity supported, During functional activity, Reliant on assistive device for balance Standing balance-Leahy Scale: Poor Standing balance comment: +2 with RW, pain increases with movement                           ADL either performed or assessed with clinical judgement   ADL Overall ADL's : Needs assistance/impaired                 Upper Body Dressing : Minimal assistance;Sitting Upper Body Dressing Details (indicate cue type and reason): dons/doffs gown                 Functional mobility during ADLs: +2 for physical assistance;Moderate assistance;Rolling walker (2 wheels) General ADL Comments: pt recieved in recliner, requesting to return to bed. improved ability to perform step pivot with shuffled steps using RW and +2 MOD A. movements slow and effortful      Cognition Arousal: Alert Behavior During Therapy: WFL for tasks assessed/performed Cognition: Cognition impaired     Awareness: Intellectual awareness intact       OT - Cognition Comments: delayed processing, perseverative  General Comments RN notified of need for pain meds. Pt's scrotum and L groin noted to be bruised, swollen and discolored. Honeycomb dressing noted to have mild drainage. Alerted MD and RN.    Pertinent Vitals/ Pain       Pain Assessment Pain Assessment: 0-10 Pain Score: 8  Pain Location: L hip Pain Intervention(s): Limited activity within patient's tolerance, Repositioned, Patient requesting pain  meds-RN notified   Frequency  Min 2X/week        Progress Toward Goals  OT Goals(current goals can now be found in the care plan section)  Progress towards OT goals: Progressing toward goals  Acute Rehab OT Goals OT Goal Formulation: With patient Time For Goal Achievement: 12/28/24 Potential to Achieve Goals: Good  Plan         AM-PAC OT 6 Clicks Daily Activity     Outcome Measure   Help from another person eating meals?: A Little Help from another person taking care of personal grooming?: A Little Help from another person toileting, which includes using toliet, bedpan, or urinal?: A Lot Help from another person bathing (including washing, rinsing, drying)?: A Lot Help from another person to put on and taking off regular upper body clothing?: A Little Help from another person to put on and taking off regular lower body clothing?: A Lot 6 Click Score: 15    End of Session Equipment Utilized During Treatment: Rolling walker (2 wheels);Gait belt  OT Visit Diagnosis: Unsteadiness on feet (R26.81);Repeated falls (R29.6);Muscle weakness (generalized) (M62.81);History of falling (Z91.81)   Activity Tolerance Patient tolerated treatment well   Patient Left with bed alarm set;with call bell/phone within reach;in bed (declines SCD application)   Nurse Communication Mobility status;Patient requests pain meds;Weight bearing status        Time: 8594-8576 OT Time Calculation (min): 18 min  Charges: OT General Charges $OT Visit: 1 Visit OT Treatments $Self Care/Home Management : 8-22 mins  Alsace Dowd L. Chelcey Caputo, OTR/L  12/19/24, 4:46 PM

## 2024-12-19 NOTE — Progress Notes (Signed)
" °  PROGRESS NOTE    Mario Patrick  FMW:[id number] DOB: Apr 01, 1941 DOA: 12/12/2024 PCP: Mario Patrick LABOR, MD  [id number]/[id number]-AA  LOS: 7 days   Brief hospital course:   Assessment & Plan: Mario Patrick is a 84 y.o. male with medical history significant of CAD status post CABG, HTN, chronic HFpEF, CKD stage IIIb, BPH, carotid stenosis, chronic thrombocytopenia, COPD, anxiety/depression, presented with mechanical fall and right hip pain.   Patient has a chronic ambulatory impairment using roller walker to ambulate, on the morning 01/21, he was using a vacuum to clean his floor and lost his footing with a walker and fell on his right side hip, denies any lightheadedness chest pain shortness of breath before or during the episode, no head or neck injury, no LOC. At baseline he uses roller walker to ambulate.    Left intertrochanteric femoral fracture Secondary to mechanical fall S/p Intramedullary nailing of Left femur with cephalomedullary device 12/12/24 PT/OT and plan to rehab  Follow-up with [facility] clinic orthopedics in 2 weeks for staple removal and x-rays of the left hip.  DVT Prophylaxis - Lovenox  and TED hose Weight-Bearing as tolerated to left leg   ABLA perioperatively  Hemodilution w/ IV fluids  Anemia of chronic kidney disease --s/p 2u pRBC --monitor   Hypotension - resolved --tapered off midodrine   HTN --BP now trending up --cont Lopressor  --resume Lisinopril    History of multivessel CAD status post CABG  --has not been on aspirin  due to allergy.  --cont statin  Chronic HFpEF   COPD Stable Cont bronchodilators   CKD stage IIIb/IV Euvolemic, AKI ruled OUT Reviewed most recent nephrology notes 07/2024 - CKDIV and baseline GFR 28, though more recent labs show Cr 1.9-2.0, GFR 33-34   Borderline underweight based on BMI: Body mass index is 19.42 kg/m.Mario Patrick   DVT prophylaxis: Lovenox  SQ Code Status: DNR  Family Communication:  Level of care:  Med-Surg Dispo:   The patient is from: home Anticipated d/c is to: SNF rehab Anticipated d/c date is: whenever SNF accepts   Subjective and Interval History:  Pt reported pain in his left side.   Objective: Vitals:   12/18/24 2059 12/19/24 0357 12/19/24 0837 12/19/24 1602  BP: 122/64 (!) 147/70 (!) 176/75 (!) 166/84  Pulse:  65 75 62  Resp: 17 15 17 17   Temp:  98.4 F (36.9 C)  98 F (36.7 C)  TempSrc:      SpO2:  98% 96% 97%  Weight:      Height:        Intake/Output Summary (Last 24 hours) at 12/19/2024 2001 Last data filed at 12/19/2024 0900 Gross per 24 hour  Intake 120 ml  Output 300 ml  Net -180 ml   Filed Weights   12/12/24 1317 12/12/24 1532  Weight: 56.7 kg 56.2 kg    Examination:   Constitutional: NAD, alert, oriented to person and place HEENT: conjunctivae and lids normal, EOMI CV: No cyanosis.   RESP: normal respiratory effort, on RA Neuro: II - XII grossly intact.     Data Reviewed: I have personally reviewed labs and imaging studies  Time spent: 50 minutes  Mario Haber, MD [facility] If 7PM-7AM, please contact night-coverage 12/19/2024, 8:01 PM   "

## 2024-12-20 DIAGNOSIS — S72002A Fracture of unspecified part of neck of left femur, initial encounter for closed fracture: Secondary | ICD-10-CM | POA: Diagnosis not present

## 2024-12-20 LAB — SARS CORONAVIRUS 2 BY RT PCR: SARS Coronavirus 2 by RT PCR: NEGATIVE

## 2024-12-20 NOTE — TOC Progression Note (Signed)
 Transition of Care [facility]) - Progression Note    Patient Details  Name: KIRKE BREACH MRN: [id number] Date of Birth: [birth year]  [facility] CM/SW Contact  Daved JONETTA Hamilton, RN Phone Number: 12/20/2024, 4:25 PM  Clinical Narrative:     Attending advised anticipate patient medically ready for discharge tomorrow. This CM spoke with Toribio at [facility] in [place], [place] and confirmed patient can be accepted tomorrow. Patient will require a negative Covid test and discharge documents sent/received by facility before [facility], Attending notified of facilities requests.   Expected Discharge Plan: Skilled Nursing Facility Barriers to Discharge: Continued Medical Work up (anemia/blood pressure)               Expected Discharge Plan and Services   Discharge Planning Services: CM Consult   Living arrangements for the past 2 months: Single Family Home                                       Social Drivers of Health (SDOH) Interventions SDOH Screenings   Food Insecurity: No Food Insecurity (12/12/2024)  Housing: Low Risk (12/12/2024)  Transportation Needs: No Transportation Needs (12/12/2024)  Utilities: Not At Risk (12/12/2024)  Financial Resource Strain: Low Risk  (11/07/2024)   Received from [facility]  Social Connections: Moderately Integrated (12/13/2024)  Tobacco Use: Medium Risk (12/12/2024)    Readmission Risk Interventions     No data to display

## 2024-12-20 NOTE — Progress Notes (Signed)
" °  PROGRESS NOTE    Mario Patrick  FMW:[id number] DOB: 1941-09-15 DOA: 12/12/2024 PCP: Zachary Idelia LABOR, MD  138A/[id number]-AA  LOS: 8 days   Brief hospital course:   Assessment & Plan: Mario Patrick is a 84 y.o. male with medical history significant of CAD status post CABG, HTN, chronic HFpEF, CKD stage IIIb, BPH, carotid stenosis, chronic thrombocytopenia, COPD, anxiety/depression, presented with mechanical fall and right hip pain.   Patient has a chronic ambulatory impairment using roller walker to ambulate, on the morning 01/21, he was using a vacuum to clean his floor and lost his footing with a walker and fell on his right side hip, denies any lightheadedness chest pain shortness of breath before or during the episode, no head or neck injury, no LOC. At baseline he uses roller walker to ambulate.    Left intertrochanteric femoral fracture Secondary to mechanical fall S/p Intramedullary nailing of Left femur with cephalomedullary device 12/12/24 PT/OT and plan to rehab  Follow-up with [facility] clinic orthopedics in 2 weeks for staple removal and x-rays of the left hip.  DVT Prophylaxis - Lovenox  and TED hose Weight-Bearing as tolerated to left leg   ABLA perioperatively  Hemodilution w/ IV fluids  Anemia of chronic kidney disease --s/p 2u pRBC --monitor   Hypotension - resolved --tapered off midodrine   HTN --BP now trending up --cont lopressor  and Lisinopril    History of multivessel CAD status post CABG  --has not been on aspirin  due to allergy.  --cont statin  Chronic HFpEF   COPD Stable Cont bronchodilators   CKD stage IIIb/IV AKI --Cr peaked at 2.61.  now improved to 2.05. --oral hydration   Borderline underweight based on BMI: Body mass index is 19.42 kg/m.[name]   DVT prophylaxis: Lovenox  SQ Code Status: DNR  Family Communication: daughter updated at bedside today Level of care: Med-Surg Dispo:   The patient is from: home Anticipated d/c is to:  SNF rehab Anticipated d/c date is: whenever SNF accepts   Subjective and Interval History:  Pt denied pain in his groin areas.   Objective: Vitals:   12/19/24 2044 12/20/24 0313 12/20/24 0752 12/20/24 1505  BP: (!) 161/81 (!) 168/85 (!) 152/78 126/64  Pulse: 66 68 63 63  Resp: 18 15 16 16   Temp: 98.5 F (36.9 C) 98.7 F (37.1 C) 98.4 F (36.9 C) (!) 97.5 F (36.4 C)  TempSrc:      SpO2: 95% 94% 92% 97%  Weight:      Height:        Intake/Output Summary (Last 24 hours) at 12/20/2024 2038 Last data filed at 12/20/2024 1851 Gross per 24 hour  Intake 720 ml  Output 950 ml  Net -230 ml   Filed Weights   12/12/24 1317 12/12/24 1532  Weight: 56.7 kg 56.2 kg    Examination:   Constitutional: NAD, alert, oriented to person and place HEENT: conjunctivae and lids normal, EOMI CV: No cyanosis.   RESP: normal respiratory effort, on RA SKIN: warm, dry Suprapubic cath present   Data Reviewed: I have personally reviewed labs and imaging studies  Time spent: 35 minutes  Ellouise Haber, MD [facility] If 7PM-7AM, please contact night-coverage 12/20/2024, 8:38 PM   "

## 2024-12-20 NOTE — Plan of Care (Signed)
" °  Problem: Clinical Measurements: Goal: Respiratory complications will improve Outcome: Progressing Goal: Cardiovascular complication will be avoided Outcome: Progressing   Problem: Activity: Goal: Risk for activity intolerance will decrease Outcome: Progressing   Problem: Bowel/Gastric: Goal: Gastrointestinal status for postoperative course will improve Outcome: Progressing   "

## 2024-12-20 NOTE — Progress Notes (Signed)
 Occupational Therapy Treatment Patient Details Name: Mario Patrick MRN: [id number] DOB: 10-11-1941 Today's Date: 12/20/2024   History of present illness Pt is a 84 y.o. male presented to ER secondary to mechanical fall with acute onset of L hip pain; admitted for management of intertrochanteric L hip fracture, s/p IM nailing (1/21), WBAT. PMH significant for CAD status post CABG, HTN, chronic HFpEF, CKD stage IIIb, BPH, carotid stenosis, chronic thrombocytopenia, COPD, anxiety/depression   OT comments  Pt seen for OT and PTA co-tx. Pt in recliner, agreeable, endorsing 8/10 L hip pain. RN notified. Pt required MIN-MOD A +2 for STS and SPT with RW recliner>BSC>EOB, MAX A for pericare in standing (unsuccessful BM attempt), and MAX A +2 to return to bed. Pt endorsing improvement in pain with return to bed. Pt progressing well. Encouraged pt to coordinate with nursing for improved pain control.       If plan is discharge home, recommend the following:  A lot of help with bathing/dressing/bathroom;Assistance with cooking/housework;Assist for transportation;Help with stairs or ramp for entrance;Two people to help with walking and/or transfers   Equipment Recommendations  BSC/3in1    Recommendations for Other Services      Precautions / Restrictions Precautions Precautions: Fall Restrictions Weight Bearing Restrictions Per Provider Order: Yes LLE Weight Bearing Per Provider Order: Weight bearing as tolerated       Mobility Bed Mobility Overal bed mobility: Needs Assistance Bed Mobility: Sit to Supine       Sit to supine: Max assist, +2 for physical assistance        Transfers Overall transfer level: Needs assistance Equipment used: Rolling walker (2 wheels) Transfers: Sit to/from Stand, Bed to chair/wheelchair/BSC Sit to Stand: Min assist, +2 physical assistance, Mod assist     Step pivot transfers: Mod assist, +2 physical assistance, Min assist           Balance  Overall balance assessment: Needs assistance Sitting-balance support: Feet supported Sitting balance-Leahy Scale: Fair     Standing balance support: Bilateral upper extremity supported, During functional activity, Reliant on assistive device for balance Standing balance-Leahy Scale: Poor                             ADL either performed or assessed with clinical judgement   ADL Overall ADL's : Needs assistance/impaired     Grooming: Sitting;Set up;Wash/dry face               Lower Body Dressing: Sitting/lateral leans;Moderate assistance Lower Body Dressing Details (indicate cue type and reason): can adjust socks with effort but requires assist to initiate donning Toilet Transfer: Minimal assistance;Moderate assistance;Stand-pivot;BSC/3in1;Rolling walker (2 wheels);+2 for physical assistance   Toileting- Clothing Manipulation and Hygiene: Maximal assistance;Sit to/from stand              Extremity/Trunk Assessment              Vision       Perception     Praxis     Communication Communication Communication: Impaired Factors Affecting Communication: Hearing impaired;Reduced clarity of speech;Difficulty expressing self   Cognition Arousal: Alert Behavior During Therapy: WFL for tasks assessed/performed Cognition: No apparent impairments                               Following commands: Impaired Following commands impaired: Follows one step commands with increased time      Cueing  Cueing Techniques: Verbal cues, Gestural cues, Tactile cues  Exercises      Shoulder Instructions       General Comments      Pertinent Vitals/ Pain       Pain Assessment Pain Assessment: 0-10 Pain Score: 8  Pain Location: L hip Pain Descriptors / Indicators: Grimacing, Aching, Guarding Pain Intervention(s): Limited activity within patient's tolerance, Monitored during session, Repositioned, Patient requesting pain meds-RN notified  Home  Living                                          Prior Functioning/Environment              Frequency  Min 2X/week        Progress Toward Goals  OT Goals(current goals can now be found in the care plan section)  Progress towards OT goals: Progressing toward goals  Acute Rehab OT Goals Patient Stated Goal: improve pain OT Goal Formulation: With patient Time For Goal Achievement: 12/28/24 Potential to Achieve Goals: Good  Plan      Co-evaluation    PT/OT/SLP Co-Evaluation/Treatment: Yes Reason for Co-Treatment: For patient/therapist safety;To address functional/ADL transfers PT goals addressed during session: Mobility/safety with mobility OT goals addressed during session: ADL's and self-care      AM-PAC OT 6 Clicks Daily Activity     Outcome Measure   Help from another person eating meals?: A Little Help from another person taking care of personal grooming?: A Little Help from another person toileting, which includes using toliet, bedpan, or urinal?: A Lot Help from another person bathing (including washing, rinsing, drying)?: A Lot Help from another person to put on and taking off regular upper body clothing?: A Little Help from another person to put on and taking off regular lower body clothing?: A Lot 6 Click Score: 15    End of Session Equipment Utilized During Treatment: Gait belt;Rolling walker (2 wheels)  OT Visit Diagnosis: Unsteadiness on feet (R26.81);Repeated falls (R29.6);Muscle weakness (generalized) (M62.81);History of falling (Z91.81)   Activity Tolerance Patient tolerated treatment well   Patient Left in bed;with call bell/phone within reach;with bed alarm set   Nurse Communication Mobility status;Patient requests pain meds;Other (comment) (NT - pt wants a bath)        Time: 8887-8850 OT Time Calculation (min): 37 min  Charges: OT General Charges $OT Visit: 1 Visit OT Treatments $Self Care/Home Management : 8-22  mins  Warren SAUNDERS., MPH, MS, OTR/L ascom [phone] 12/20/24, 12:29 PM

## 2024-12-20 NOTE — Progress Notes (Signed)
 Physical Therapy Treatment Patient Details Name: Mario Patrick MRN: [id number] DOB: [birth year] Today's Date: 12/20/2024   History of Present Illness Pt is a 84 y.o. male presented to ER secondary to mechanical fall with acute onset of L hip pain; admitted for management of intertrochanteric L hip fracture, s/p IM nailing (1/21), WBAT. PMH significant for CAD status post CABG, HTN, chronic HFpEF, CKD stage IIIb, BPH, carotid stenosis, chronic thrombocytopenia, COPD, anxiety/depression    PT Comments  Pt received up in chair, currently uncomfortable requesting return to bed for rest and L hip dressing change. Pt able to complete B LE ankle pumps and LAQ in sitting prior to standing with Mod/MinA of 2 from low chair to RW. Pt able to advance RW towards Right for side shuffle with MinAx2 for balance and equip management to reach [facility] and again when transferring BSC>bed. Improved ability to accept weight through L LE with 9/10 c/o pain. MaxA to return to bed with reduced pain during transfer. Pt left with OT to finish up session. Good effort overall from pt.    If plan is discharge home, recommend the following: Two people to help with walking and/or transfers;Two people to help with bathing/dressing/bathroom;Help with stairs or ramp for entrance;Assistance with cooking/housework;Assist for transportation   Can travel by private vehicle     No  Equipment Recommendations  None recommended by PT    Recommendations for Other Services       Precautions / Restrictions Precautions Precautions: Fall Recall of Precautions/Restrictions: Impaired Restrictions Weight Bearing Restrictions Per Provider Order: Yes RLE Weight Bearing Per Provider Order: Weight bearing as tolerated     Mobility  Bed Mobility Overal bed mobility: Needs Assistance Bed Mobility: Sit to Supine       Sit to supine: Max assist, +2 for physical assistance        Transfers Overall transfer level: Needs  assistance Equipment used: Rolling walker (2 wheels) Transfers: Sit to/from Stand, Bed to chair/wheelchair/BSC Sit to Stand: Min assist, +2 physical assistance, Mod assist   Step pivot transfers: Mod assist, +2 physical assistance, Min assist       General transfer comment:  (Improved ability to stand with heavy cues for safe wt shifting and use of upper body)    Ambulation/Gait Ambulation/Gait assistance: Min assist, +2 physical assistance, +2 safety/equipment   Assistive device: Rolling walker (2 wheels) Gait Pattern/deviations: Step-to pattern, Decreased stance time - left, Decreased step length - right, Antalgic Gait velocity: decre     General Gait Details: Pt only able to advance a few steps from [facility] to bed with heavy reliance on upper body strength to offload L LE pain. +2 assist for balance and equipment management   Stairs             Wheelchair Mobility     Tilt Bed    Modified Rankin (Stroke Patients Only)       Balance Overall balance assessment: Needs assistance Sitting-balance support: Feet supported Sitting balance-Leahy Scale: Fair     Standing balance support: Bilateral upper extremity supported, During functional activity, Reliant on assistive device for balance Standing balance-Leahy Scale: Poor                              Communication Communication Communication: Impaired Factors Affecting Communication: Hearing impaired;Reduced clarity of speech;Difficulty expressing self  Cognition Arousal: Alert Behavior During Therapy: WFL for tasks assessed/performed   PT - Cognitive impairments: No apparent  impairments                         Following commands: Impaired Following commands impaired: Follows one step commands with increased time    Cueing Cueing Techniques: Verbal cues, Gestural cues, Tactile cues  Exercises General Exercises - Lower Extremity Ankle Circles/Pumps: AROM, Both, 10 reps, Seated Long Arc  Quad: AROM, Both, 10 reps, Seated    General Comments General comments (skin integrity, edema, etc.): L hip incisional dressing with significant bloody drainiage      Pertinent Vitals/Pain Pain Assessment Pain Assessment: 0-10 Pain Score: 8  Pain Location: L hip Pain Descriptors / Indicators: Grimacing, Aching, Guarding    Home Living                          Prior Function            PT Goals (current goals can now be found in the care plan section) Acute Rehab PT Goals Patient Stated Goal: agreeable to participation with session    Frequency    Min 3X/week      PT Plan      Co-evaluation   Reason for Co-Treatment: For patient/therapist safety;To address functional/ADL transfers PT goals addressed during session: Mobility/safety with mobility OT goals addressed during session: ADL's and self-care      AM-PAC PT 6 Clicks Mobility   Outcome Measure  Help needed turning from your back to your side while in a flat bed without using bedrails?: A Lot Help needed moving from lying on your back to sitting on the side of a flat bed without using bedrails?: A Lot Help needed moving to and from a bed to a chair (including a wheelchair)?: A Lot Help needed standing up from a chair using your arms (e.g., wheelchair or bedside chair)?: A Lot Help needed to walk in hospital room?: Total Help needed climbing 3-5 steps with a railing? : Total 6 Click Score: 10    End of Session         PT Visit Diagnosis: Unsteadiness on feet (R26.81);Other abnormalities of gait and mobility (R26.89);Difficulty in walking, not elsewhere classified (R26.2);Pain Pain - Right/Left: Left Pain - part of body: Hip     Time: 8877-8853 PT Time Calculation (min) (ACUTE ONLY): 24 min  Charges:    $Therapeutic Activity: 8-22 mins PT General Charges $$ ACUTE PT VISIT: 1 Visit                    Mario Patrick, PTA  Mario Patrick 12/20/2024, 1:11 PM

## 2024-12-20 NOTE — Progress Notes (Addendum)
 "                                                                                                                                                                                                          Daily Progress Note   Patient Name: Mario Patrick       Date: 12/20/2024 DOB: 1941/03/16  Age: 84 y.o. MRN#: [id number] Attending Physician: Awanda City, MD Primary Care Physician: Zachary Idelia LABOR, MD Admit Date: 12/12/2024  Reason for Consultation/Follow-up: Establishing goals of care, recommendations made regarding postop pain  Subjective: Notes and labs reviewed.  MAR reviewed, and gabapentin  has been initiated with renal dosing for neuropathic pain.  Patient has oxycodone  available, and received several doses yesterday; none today.  He also has Robaxin  available for muscle spasms.  In to see patient.  He is currently working with PT/OT and is sitting on bedside toilet.  He states he continues to have pain with moving, 8/10.  Discussed the importance of making staff aware of his pain so that as needed medications can be provided.   Length of Stay: 8  Current Medications: Scheduled Meds:   atorvastatin   80 mg Oral QHS   budesonide -glycopyrrolate -formoterol   2 puff Inhalation BID   Chlorhexidine  Gluconate Cloth  6 each Topical Daily   docusate sodium   100 mg Oral BID   enoxaparin  (LOVENOX ) injection  30 mg Subcutaneous Q24H   ezetimibe   10 mg Oral Daily   feeding supplement  1 Container Oral TID BM   gabapentin   200 mg Oral BID   lisinopril   10 mg Oral Daily   metoprolol  tartrate  12.5 mg Oral BID   sertraline   150 mg Oral Daily   tamsulosin   0.4 mg Oral Daily   traZODone   100 mg Oral QHS    Continuous Infusions:   PRN Meds: albuterol , bisacodyl , methocarbamol  **OR** methocarbamol  (ROBAXIN ) injection, metoCLOPramide  **OR** metoCLOPramide  (REGLAN ) injection, ondansetron  **OR** ondansetron  (ZOFRAN ) IV, oxyCODONE , oxyCODONE , senna-docusate, sodium phosphate,  traMADol   Physical Exam Pulmonary:     Effort: Pulmonary effort is normal.  Neurological:     Mental Status: He is alert.             Vital Signs: BP (!) 152/78 (BP Location: Right Arm)   Pulse 63   Temp 98.4 F (36.9 C)   Resp 16   Ht 5' 7 (1.702 m)   Wt 56.2 kg   SpO2 92%   BMI 19.42 kg/m  SpO2: SpO2: 92 % O2 Device: O2 Device: Room Air O2 Flow Rate: O2 Flow Rate (L/min):  3 L/min  Intake/output summary:  Intake/Output Summary (Last 24 hours) at 12/20/2024 1312 Last data filed at 12/20/2024 0320 Gross per 24 hour  Intake 240 ml  Output 350 ml  Net -110 ml   LBM: Last BM Date : 12/18/24 Baseline Weight: Weight: 56.7 kg Most recent weight: Weight: 56.2 kg    Patient Active Problem List   Diagnosis Date Noted   Malnutrition of moderate degree 12/14/2024   Hip fracture, unspecified laterality, closed, initial encounter (HCC) 12/12/2024   Hip fracture (HCC) 12/12/2024   Acute on chronic congestive heart failure (HCC) 01/05/2024   Acute on chronic diastolic CHF (congestive heart failure) (HCC) 01/04/2024   COPD (chronic obstructive pulmonary disease) (HCC) 01/04/2024   CAD (coronary artery disease) 01/04/2024   Myocardial injury 01/04/2024   Chronic kidney disease, stage 3b (HCC) 01/04/2024   Type II diabetes mellitus with renal manifestations (HCC) 01/04/2024   Depression 01/04/2024   Thrombocytopenia 01/04/2024   GERD (gastroesophageal reflux disease) 01/04/2024   Carotid stenosis 02/11/2023   HTN (hypertension) 02/11/2023   Hypercholesteremia 02/11/2023   Hypotension 09/30/2020   Orthostatic hypotension 09/30/2020    Palliative Care Assessment & Plan   Recommendations/Plan:  In addition to assessing general pain, would recommend assessment and treatment of functional pain (when patient is moving).  Would recommend premedicating 1-2 hours prior to working with PT/OT.   Code Status:    Code Status Orders  (From admission, onward)            Start     Ordered   12/12/24 1520  Do not attempt resuscitation (DNR)- Limited -Do Not Intubate (DNI)  (Code Status)  Continuous       Question Answer Comment  If pulseless and not breathing No CPR or chest compressions.   In Pre-Arrest Conditions (Patient Is Breathing and Has A Pulse) Do not intubate. Provide all appropriate non-invasive medical interventions. Avoid ICU transfer unless indicated or required.   Consent: Discussion documented in EHR or advanced directives reviewed      12/12/24 1519           Code Status History     Date Active Date Inactive Code Status Order ID Comments User Context   12/12/2024 1339 12/12/2024 1519 Full Code [id number]  Laurita Cort DASEN, MD ED   01/04/2024 1950 01/05/2024 2123 Full Code [id number]  Hilma Rankins, MD ED   07/16/2021 1617 07/16/2021 2134 Full Code [id number]  Kathlynn Sharper, MD Inpatient   09/30/2020 0105 10/01/2020 1809 Full Code [id number]  Mansy, Madison LABOR, MD ED       Care plan was discussed with PT/OT  Thank you for allowing the Palliative Medicine Team to assist in the care of this patient.   Time In: 11:35 Time Out: 12:00 Total Time 25 min Prolonged Time Billed  no       Greater than 50%  of this time was spent counseling and coordinating care related to the above assessment and plan.  Camelia Lewis, NP  Please contact Palliative Medicine Team phone at [phone] for questions and concerns.       "

## 2024-12-21 DIAGNOSIS — S72002A Fracture of unspecified part of neck of left femur, initial encounter for closed fracture: Secondary | ICD-10-CM | POA: Diagnosis not present

## 2024-12-21 MED ORDER — OXYCODONE HCL 5 MG PO TABS: 5.0000 mg | ORAL_TABLET | Freq: Four times a day (QID) | ORAL | 0 refills | Status: AC | PRN

## 2024-12-21 MED ORDER — SERTRALINE HCL 100 MG PO TABS: 150.0000 mg | ORAL_TABLET | Freq: Every day | ORAL | Status: AC

## 2024-12-21 MED ORDER — ENOXAPARIN SODIUM 40 MG/0.4ML IJ SOSY: 40.0000 mg | PREFILLED_SYRINGE | INTRAMUSCULAR | Status: AC

## 2024-12-21 MED ORDER — METHOCARBAMOL 500 MG PO TABS: 500.0000 mg | ORAL_TABLET | Freq: Four times a day (QID) | ORAL | Status: AC | PRN

## 2024-12-21 MED ORDER — OXYCODONE HCL 5 MG PO TABS: 5.0000 mg | ORAL_TABLET | Freq: Four times a day (QID) | ORAL | 0 refills | Status: DC | PRN

## 2024-12-21 NOTE — TOC Transition Note (Signed)
 Transition of Care [facility]) - Discharge Note   Patient Details  Name: Mario Patrick MRN: [id number] Date of Birth: 1940-12-14  Transition of Care [facility] CM/SW Contact:  Nathanael CHRISTELLA Ring, RN Phone Number: 12/21/2024, 9:57 AM   Clinical Narrative:    Patient has been accepted to [facility]  Daughter updated this morning.  Patient is going to room 1204B, bedside RN will call report to [phone] and ask for the supervisor to give report to.  CM has arranged transport with [facility], patient is 4th on the list for pickup.  DC summary and negative COVID result faxed to [phone].    Final next level of care: Skilled Nursing Facility Barriers to Discharge: Barriers Resolved   Patient Goals and CMS Choice   CMS Medicare.gov Compare Post Acute Care list provided to:: Patient Represenative (must comment) Choice offered to / list presented to : Adult Children      Discharge Placement              Patient chooses bed at: [facility] Patient to be transferred to facility by: [facility] Name of family member notified: daughter [name] Patient and family notified of of transfer: 12/21/24  Discharge Plan and Services Additional resources added to the After Visit Summary for     Discharge Planning Services: CM Consult            DME Arranged: N/A         HH Arranged: NA          Social Drivers of Health (SDOH) Interventions SDOH Screenings   Food Insecurity: No Food Insecurity (12/12/2024)  Housing: Low Risk (12/12/2024)  Transportation Needs: No Transportation Needs (12/12/2024)  Utilities: Not At Risk (12/12/2024)  Financial Resource Strain: Low Risk  (11/07/2024)   Received from [facility]  Social Connections: Moderately Integrated (12/13/2024)  Tobacco Use: Medium Risk (12/12/2024)     Readmission Risk Interventions     No data to display
